# Patient Record
Sex: Female | Born: 1955
Health system: Southern US, Community
[De-identification: ages and names within clinical notes are randomized; demographics above are authoritative.]

## PROBLEM LIST (undated history)

## (undated) DIAGNOSIS — R112 Nausea with vomiting, unspecified: Secondary | ICD-10-CM

## (undated) DIAGNOSIS — Z9889 Other specified postprocedural states: Secondary | ICD-10-CM

## (undated) DIAGNOSIS — K219 Gastro-esophageal reflux disease without esophagitis: Secondary | ICD-10-CM

## (undated) DIAGNOSIS — K589 Irritable bowel syndrome without diarrhea: Secondary | ICD-10-CM

## (undated) DIAGNOSIS — I10 Essential (primary) hypertension: Secondary | ICD-10-CM

## (undated) DIAGNOSIS — I872 Venous insufficiency (chronic) (peripheral): Secondary | ICD-10-CM

## (undated) DIAGNOSIS — F419 Anxiety disorder, unspecified: Secondary | ICD-10-CM

## (undated) DIAGNOSIS — Z973 Presence of spectacles and contact lenses: Secondary | ICD-10-CM

## (undated) DIAGNOSIS — J45909 Unspecified asthma, uncomplicated: Secondary | ICD-10-CM

## (undated) DIAGNOSIS — F39 Unspecified mood [affective] disorder: Secondary | ICD-10-CM

## (undated) DIAGNOSIS — M79673 Pain in unspecified foot: Secondary | ICD-10-CM

## (undated) DIAGNOSIS — G4730 Sleep apnea, unspecified: Secondary | ICD-10-CM

## (undated) DIAGNOSIS — IMO0002 Reserved for concepts with insufficient information to code with codable children: Secondary | ICD-10-CM

## (undated) DIAGNOSIS — M797 Fibromyalgia: Secondary | ICD-10-CM

## (undated) HISTORY — DX: Unspecified mood (affective) disorder: F39

## (undated) HISTORY — PX: KNEE ARTHROPLASTY: SHX992

## (undated) HISTORY — DX: Reserved for concepts with insufficient information to code with codable children: IMO0002

## (undated) HISTORY — DX: Irritable bowel syndrome, unspecified: K58.9

## (undated) HISTORY — DX: Unspecified asthma, uncomplicated: J45.909

## (undated) HISTORY — DX: Venous insufficiency (chronic) (peripheral): I87.2

## (undated) HISTORY — DX: Pain in unspecified foot: M79.673

## (undated) HISTORY — DX: Fibromyalgia: M79.7

## (undated) HISTORY — DX: Anxiety disorder, unspecified: F41.9

---

## 2003-04-10 ENCOUNTER — Ambulatory Visit (HOSPITAL_COMMUNITY): Admission: RE | Admit: 2003-04-10 | Discharge: 2003-04-10 | Payer: Self-pay | Admitting: Internal Medicine

## 2003-04-10 HISTORY — PX: ESOPHAGOGASTRODUODENOSCOPY: SHX1529

## 2003-04-13 ENCOUNTER — Encounter: Payer: Self-pay | Admitting: Internal Medicine

## 2003-04-13 ENCOUNTER — Ambulatory Visit (HOSPITAL_COMMUNITY): Admission: RE | Admit: 2003-04-13 | Discharge: 2003-04-13 | Payer: Self-pay | Admitting: Internal Medicine

## 2005-04-19 ENCOUNTER — Ambulatory Visit: Payer: Self-pay | Admitting: Cardiology

## 2007-06-13 ENCOUNTER — Ambulatory Visit: Payer: Self-pay | Admitting: Gastroenterology

## 2009-08-25 DIAGNOSIS — Z8719 Personal history of other diseases of the digestive system: Secondary | ICD-10-CM | POA: Insufficient documentation

## 2009-08-25 DIAGNOSIS — F329 Major depressive disorder, single episode, unspecified: Secondary | ICD-10-CM | POA: Insufficient documentation

## 2009-08-25 DIAGNOSIS — J45909 Unspecified asthma, uncomplicated: Secondary | ICD-10-CM | POA: Insufficient documentation

## 2009-08-25 DIAGNOSIS — K219 Gastro-esophageal reflux disease without esophagitis: Secondary | ICD-10-CM | POA: Insufficient documentation

## 2009-08-25 DIAGNOSIS — A048 Other specified bacterial intestinal infections: Secondary | ICD-10-CM | POA: Insufficient documentation

## 2009-08-25 DIAGNOSIS — F3289 Other specified depressive episodes: Secondary | ICD-10-CM | POA: Insufficient documentation

## 2009-08-25 DIAGNOSIS — K8020 Calculus of gallbladder without cholecystitis without obstruction: Secondary | ICD-10-CM | POA: Insufficient documentation

## 2009-08-25 DIAGNOSIS — T7840XA Allergy, unspecified, initial encounter: Secondary | ICD-10-CM | POA: Insufficient documentation

## 2009-08-30 ENCOUNTER — Telehealth (INDEPENDENT_AMBULATORY_CARE_PROVIDER_SITE_OTHER): Payer: Self-pay | Admitting: *Deleted

## 2010-08-13 ENCOUNTER — Emergency Department (HOSPITAL_BASED_OUTPATIENT_CLINIC_OR_DEPARTMENT_OTHER): Admission: EM | Admit: 2010-08-13 | Discharge: 2010-08-13 | Payer: Self-pay | Admitting: Emergency Medicine

## 2010-09-11 HISTORY — PX: TIBIA FRACTURE SURGERY: SHX806

## 2011-01-24 NOTE — Assessment & Plan Note (Signed)
Marie Farmer, Marie Farmer                CHART#:  [id number]   DATE:  06/13/2007                       DOB:  [birth year]   CHIEF COMPLAINT:  Sour stomach, nausea, gas.   HISTORY OF PRESENT ILLNESS:  The patient is a 55 year old Caucasian  female who presents for further evaluation of the above-stated symptoms.  She is well known to our practice from previous evaluation of upper  abdominal pain, nausea, and gastroesophageal reflux disease.  She is  known to have cholelithiasis, though has not displayed typical  gallbladder symptoms in the past.  She presents with a 2-week history of  her stomach feeling sour.  She has only had pain on 1 occasion that  was associated with vomiting.  She has had intermittent nausea for 2  weeks.  She states her appetite is good, however.  She has had some  slight indigestion.  She is on Prilosec chronically.  She denies any  nocturnal symptoms.  Her bowel movements are regular.  She denies any  melena or rectal bleeding.  She has never had a colonoscopy.  She states  she has had abdominal gas and bloating for years.  She saw her  gynecologist in July of 2008 and states her exam was normal.  Since we  saw her in 2004, she has dropped 27 pounds. The patient tells me she got  down to 173, but has gained nearly 20 pounds back.  This was an  intentional weight loss.  She is on Prilosec chronically.   CURRENT MEDICATIONS:  1. Ambien CR at bedtime.  2. Prozac 80 mg 2 tablets daily.  3. Prilosec 20 mg daily.   ALLERGIES:  RELAFEN and CYMBALTA.   PAST MEDICAL HISTORY:  1. Chronic gastroesophageal reflux disease.  2. Depression.  3. Asthma.  4. Allergies.  5. She had an EGD in July of 2004 and had antral erosions.  H. pylori      serologies were positive.  She underwent triple drug therapy.  6. She has a history of cholelithiasis.   PAST SURGICAL HISTORY:  Left wrist surgery and tubal ligation.   FAMILY HISTORY:  Mother is 29 and has a history of diabetes,  heart  disease, and emphysema.  Father deceased at age 57 with lung carcinoma.  Maternal grandmother had cancer, but the patient is not aware of the  etiology.   SOCIAL HISTORY:  She is married and has 2 children.  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  She is a nonsmoker.  No  alcohol use.   REVIEW OF SYSTEMS:  See HPI for GI and constitutional.  Cardiopulmonary:  She denies any chest pain, shortness of breath, palpitations or cough.  She states that she had been on hydrochlorothiazide for lower extremity  edema discontinued when her edema issues resolved.  She has noted that  her blood pressure has been elevated on a couple of occasions since  being off the medication.  Genitourinary:  Complains of urinary  frequency at times.  She notes this particularly when she has a lot of  bloating and discomfort.  She denies any dysuria or hematuria.  She has  irregular menstrual cycles.   PHYSICAL EXAMINATION:  Weight 190, height 5 feet 4 inches, temp 98.1,  blood pressure 160/100, pulse 64.  GENERAL:  A pleasant, well-nourished, well-developed Caucasian female in  no acute distress.  SKIN:  Warm and dry.  No jaundice.  HEENT:  Sclerae nonicteric, oropharyngeal mucosa moist and pink, no  lesions, erythema or exudate.  No lymphadenopathy or thyromegaly.  CHEST:  Lungs clear to auscultation.  CARDIAC EXAM:  Regular rate and rhythm, normal S1, S2, no murmurs, rubs,  or gallops.  ABDOMEN:  Positive bowel sounds, abdomen soft, nondistended.  She has  minimal epigastric tenderness to deep palpation.  No rebound,  tenderness, or guarding, no hepatosplenomegaly or masses.  No abdominal  bruits or hernias.  EXTREMITIES:  No edema.   IMPRESSION:  Ranata is a 55 year old lady with a history of chronic  gastroesophageal reflux disease.  She now presents with vague nausea and  sour stomach.  Symptoms may be secondary to dyspepsia or refractory  gastroesophageal reflux  disease.  She is known to have cholelithiasis,  but really is not displaying any typical biliary symptoms.  She  complains of chronic excessive flatulence.  She has tried to make  dietary modifications.  In the past she did improve with probiotic  agents.   PLAN:  1. Increase Prilosec to 20 mg p.o. b.i.d.  2. Gas-X p.r.n. as directed.  3. Probiotic such as Flora-Q as directed.  4. Literature regarding gastroesophageal reflux disease and abdominal      gas and flatulence provided to the patient.  5. Antireflux measures.  6. She will follow up with Dr. Margo Common regarding elevated blood      pressure for recheck.  7. She will call us in 2-3 weeks with a progress report.  At that time      we will determine need for further workup.       Tana Coast, P.A.  Electronically Signed     Kassie Mends, M.D.  Electronically Signed    LL/MEDQ  D:  06/13/2007  T:  06/13/2007  Job:  [id number]   cc:   Wyvonnia Lora

## 2011-01-27 NOTE — Op Note (Signed)
NAME:  Marie Farmer, Marie Farmer                         ACCOUNT NO.:  [id number]   MEDICAL RECORD NO.:  [id number]                   PATIENT TYPE:  AMB   LOCATION:  DAY                                  FACILITY:  [facility]   PHYSICIAN:  R. Roetta Sessions, M.D.              DATE OF BIRTH:  08-Dec-1955   DATE OF PROCEDURE:  04/10/2003  DATE OF DISCHARGE:                                 OPERATIVE REPORT   PROCEDURE:  Diagnostic esophagogastroduodenoscopy.   INDICATIONS FOR PROCEDURE:  The patient is a 55 year old lady with  intermittent reflux symptoms and some upper abdominal pain.  She has lost a  considerable amount of weight in attempting to treat her gastroesophageal  reflux disease.  She has been taking Vioxx and Celebrex and is currently  taking Prilosec 20 mg orally daily and metoclopramide.  EGD is now being  done to further evaluate her symptoms.  This approach has been discussed  with the patient at length previously.  The potential risks, benefits, and  alternatives have been reviewed.  Please see my H&P from 04/01/2003 for more  information.   PROCEDURE:  O2 saturation, blood pressure, pulses, and respirations were  monitored throughout the entire procedure.  Conscious sedation was with  Versed 3 mg IV, Demerol 75 mg IV, Cetacaine spray for topical oropharyngeal  anesthesia.  The instrument used was the Olympus video chip adult  gastroscope.   FINDINGS:  Examination of the tubular esophagus revealed no mucosal  abnormalities.  The EG junction was easily traversed.   Stomach:  The gastric cavity was empty and insufflated well with air.  Thorough examination of the gastric mucosa including retroflex view in the  proximal stomach and esophagogastric junction demonstrated multiple antral  erosions.  Some of these had a minimal degree of hemorrhage associated with  them.  There was no infiltrating process, no frank peptic ulcer.  The  pylorus was patent and easily traversed.   Duodenum:   The bulb and second portion appeared normal.   THERAPEUTIC/DIAGNOSTIC MANEUVERS PERFORMED:  None.   The patient tolerated the procedure well and was reactive in endoscopy.   IMPRESSION:  1. Normal esophagus.  2. Antral erosions, as described above.  The remainder of the stomach     appeared normal.  Normal first and second parts of the duodenum.   RECOMMENDATIONS:  1. Will check H pylori serology.  2.     Continue Prilosec for the time being.  3. Proceed with gallbladder ultrasound to further evaluate this lady's     symptoms.  Further recommendations to follow.                                               Jonathon Bellows, M.D.    RMR/MEDQ  D:  04/10/2003  T:  04/10/2003  Job:  [id number]   cc:   Wyvonnia Lora  [address]  [place]  [place] [address]  Fax: [phone]

## 2011-01-27 NOTE — H&P (Signed)
NAMEAASHNA, MATSON                           ACCOUNT NO.:  [id number]   MEDICAL RECORD NO.:  [id number]                  PATIENT TYPE:   LOCATION:                                       FACILITY:   PHYSICIAN:  R. Roetta Sessions, M.D.              DATE OF BIRTH:  02/16/1948   DATE OF ADMISSION:  04/01/2003  DATE OF DISCHARGE:                                HISTORY & PHYSICAL   CHIEF COMPLAINT:  Nausea and sour stomach.   HISTORY OF PRESENT ILLNESS:  The patient is a pleasant, 55 year old,  Caucasian female seen by [place] years ago for gastrointestinal reflux disease.  She was doing fairly well on Prilosec 20 mg orally daily until recently when  she started having a sour stomach and a constant sensation of nausea.  She  rarely has any vomiting.  No odynophagia.  No dysphagia.  She does have  occasional typical reflux symptoms.  She has been on Vioxx and/or Celebrex  over the past few years.  She has not had any melena or rectal bleeding.  Back in February of 1998, I saw this nice lady at [facility] and  performed an EGD.  She was found to have hiatal hernia and reflux  esophagitis.  Her CLOtest was negative.  She has been successful, however,  in losing a good 35 pounds in an effort to combat her gastrointestinal  reflux symptoms.  I have seen her in just over three years.  She denies any  other intercurrent medical problems.  She continues to be followed primarily  by Dr. Margo Common.  There is no history of peptic ulcer disease.   PAST MEDICAL HISTORY:  Significant for:  1. Depression.  2. Obesity.  3. Asthma.  4. Gastrointestinal reflux disease.  5. Chronic back pain secondary to bulging disk.   CURRENT MEDICATIONS:  1. Lorazepam 0.5 mg b.i.d. p.r.n.  2. Prilosec 20 mg daily.  3. Triamterene/HCTZ daily.  4. Metoclopramide 5 mg q.i.d. p.r.n.  5. Valtrex 1 g daily p.r.n.  6. Neurontin 800 mg three times daily.  7. Hydrocodone p.r.n.  8. Skelaxin 400 mg two tablets p.r.n.  9. Lasix 40 mg daily.  10.      Celebrex 200 mg daily.  11.      Klor-Con 10 mEq daily.  12.      Wellbutrin 300 mg daily.  13.      Tylenol called.   ALLERGIES:  RELAFEN.   FAMILY HISTORY:  Negative for chronic GI or liver disease.   SOCIAL HISTORY:  The patient has been married for 22 years with two  children.  Employed with [employer]  No tobacco.  No alcohol.   REVIEW OF SYSTEMS:  Has had some recent chest pain (saw Dr. Myrtis Ser last week,  workup in progress, not felt to be coronary artery disease).  Weight loss as  described above, which has been secondary to conscious  effort.  No fever or  chills.   PHYSICAL EXAMINATION:  GENERAL APPEARANCE:  A 55 year old lady resting  comfortably.  WEIGHT:  207.5 pounds.  VITAL SIGNS:  BP 140/88, pulse 70.  SKIN:  Warm and dry.  No lesions.  HEENT:  No scleral icterus.  The conjunctivae are pink.  Oral cavity with no  lesions.  NECK:  JVD is not prominent.  CHEST:  Lungs are clear to auscultation.  CARDIAC:  Regular rate and rhythm without murmur, rub, or gallop.  BREASTS:  Exam is deferred.  ABDOMEN:  Obese.  Positive bowel sounds.  Soft and nontender.  No  appreciable mass or organomegaly.  EXTREMITIES:  No edema.   IMPRESSION:  The patient is a pleasant 55 year old lady with chronic nausea  and sour stomach sensation and ongoing intermittent reflux symptoms.  She  tells me that she does not feel that acid suppression therapy as working as  well as it did previously.  I do not really get a sense that she has  prominent reflux symptoms.  Now the symptoms are quite a bit different.  I  would be concerned about the possibility of occult peptic ulcer disease or  other mucosal pathology in her upper GI tract contributing to her symptoms.   RECOMMENDATIONS:  I have offered the patient an EGD to further evaluate her  symptoms.  The potential risks, benefits, and alternatives have been  reviewed and questions answered.  Depending on  findings of the EGD, may  consider gallbladder evaluation.  In light of her significant weight loss  recently, this would put her at some increased risk for cholelithiasis.  Further recommendations to follow.                                               Jonathon Bellows, M.D.    RMR/MEDQ  D:  04/01/2003  T:  04/01/2003  Job:  [id number]   cc:   Wyvonnia Lora  [address]  [place]  [place] [address]  Fax: [phone]

## 2011-05-10 DIAGNOSIS — S82143A Displaced bicondylar fracture of unspecified tibia, initial encounter for closed fracture: Secondary | ICD-10-CM | POA: Insufficient documentation

## 2011-05-12 ENCOUNTER — Encounter: Payer: Self-pay | Admitting: Emergency Medicine

## 2011-05-12 ENCOUNTER — Emergency Department (HOSPITAL_COMMUNITY)
Admission: EM | Admit: 2011-05-12 | Discharge: 2011-05-12 | Payer: Self-pay | Attending: Emergency Medicine | Admitting: Emergency Medicine

## 2011-05-12 ENCOUNTER — Emergency Department (HOSPITAL_COMMUNITY): Payer: Self-pay

## 2011-05-12 DIAGNOSIS — I10 Essential (primary) hypertension: Secondary | ICD-10-CM | POA: Insufficient documentation

## 2011-05-12 DIAGNOSIS — S139XXA Sprain of joints and ligaments of unspecified parts of neck, initial encounter: Secondary | ICD-10-CM | POA: Insufficient documentation

## 2011-05-12 DIAGNOSIS — Y92009 Unspecified place in unspecified non-institutional (private) residence as the place of occurrence of the external cause: Secondary | ICD-10-CM | POA: Insufficient documentation

## 2011-05-12 DIAGNOSIS — S161XXA Strain of muscle, fascia and tendon at neck level, initial encounter: Secondary | ICD-10-CM

## 2011-05-12 DIAGNOSIS — S8000XA Contusion of unspecified knee, initial encounter: Secondary | ICD-10-CM | POA: Insufficient documentation

## 2011-05-12 DIAGNOSIS — K219 Gastro-esophageal reflux disease without esophagitis: Secondary | ICD-10-CM | POA: Insufficient documentation

## 2011-05-12 HISTORY — DX: Essential (primary) hypertension: I10

## 2011-05-12 HISTORY — DX: Gastro-esophageal reflux disease without esophagitis: K21.9

## 2011-05-12 MED ORDER — HYDROCODONE-ACETAMINOPHEN 5-325 MG PO TABS
1.0000 | ORAL_TABLET | Freq: Once | ORAL | Status: AC
  Administered 2011-05-12: 1 via ORAL
  Filled 2011-05-12: qty 1
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
## 2011-12-20 HISTORY — PX: ESOPHAGOGASTRODUODENOSCOPY: SHX1529

## 2011-12-20 HISTORY — PX: COLONOSCOPY: SHX174

## 2012-02-26 ENCOUNTER — Encounter: Payer: Self-pay | Admitting: Internal Medicine

## 2012-02-29 ENCOUNTER — Ambulatory Visit (INDEPENDENT_AMBULATORY_CARE_PROVIDER_SITE_OTHER): Payer: BC Managed Care – PPO | Admitting: Urgent Care

## 2012-02-29 ENCOUNTER — Encounter: Payer: Self-pay | Admitting: Urgent Care

## 2012-02-29 VITALS — BP 114/71 | HR 55 | Temp 98.4°F | Ht 63.0 in | Wt 185.0 lb

## 2012-02-29 DIAGNOSIS — K589 Irritable bowel syndrome without diarrhea: Secondary | ICD-10-CM | POA: Insufficient documentation

## 2012-02-29 DIAGNOSIS — K219 Gastro-esophageal reflux disease without esophagitis: Secondary | ICD-10-CM

## 2012-02-29 NOTE — Assessment & Plan Note (Signed)
Discussed chronic nature & management of IBS. BeneFiber daily Align 1 capsule daily

## 2012-02-29 NOTE — Patient Instructions (Addendum)
Continued dexilant 60 mg daily Fiber daily Align 1 capsule daily Discussed been density testing with Dr. Margo Common.   He can also make suggestions regarding your calcium supplementation and/or bone building medications if needed. Office visit in 6 months or sooner if needed Diet for GERD or PUD Nutrition therapy can help ease the discomfort of gastroesophageal reflux disease (GERD) and peptic ulcer disease (PUD).  HOME CARE INSTRUCTIONS   Eat your meals slowly, in a relaxed setting.   Eat 5 to 6 small meals per day.   If a food causes distress, stop eating it for a period of time.  FOODS TO AVOID  Coffee, regular or decaffeinated.   Cola beverages, regular or low calorie.   Tea, regular or decaffeinated.   Pepper.   Cocoa.   High fat foods, including meats.   Butter, margarine, hydrogenated oil (trans fats).   Peppermint or spearmint (if you have GERD).   Fruits and vegetables if not tolerated.   Alcohol.   Nicotine (smoking or chewing). This is one of the most potent stimulants to acid production in the gastrointestinal tract.   Any food that seems to aggravate your condition.  If you have questions regarding your diet, ask your caregiver or a registered dietitian. TIPS  Lying flat may make symptoms worse. Keep the head of your bed raised 6 to 9 inches (15 to 23 cm) by using a foam wedge or blocks under the legs of the bed.   Do not lay down until 3 hours after eating a meal.   Daily physical activity may help reduce symptoms.  MAKE SURE YOU:   Understand these instructions.   Will watch your condition.   Will get help right away if you are not doing well or get worse.  Document Released: 08/28/2005 Document Revised: 08/17/2011 Document Reviewed: 07/14/2011 [facility] Patient Information 2012 [place], [place].

## 2012-02-29 NOTE — Assessment & Plan Note (Addendum)
Marie Farmer is a pleasant 56 y.o. female  Here to reestablish care after living in [place].  Hx GERD & IBS.  Recent colonoscopy/EGD/small bowel bx benign.  Dexilant controlling GERD symptoms.  Discussed risk of osteoporosis w/ PPis.  Questions answered.  Continue dexilant 60 mg daily Discussed bone density testing with Dr. Margo Common.   He can also make suggestions regarding your calcium supplementation and/or bone building medications if needed. GERD diet Office visit in 6 months or sooner if needed

## 2012-02-29 NOTE — Progress Notes (Signed)
Faxed to PCP

## 2012-02-29 NOTE — Progress Notes (Signed)
Primary Care Physician:  Louie Boston, MD Primary Gastroenterologist:  Dr. Jena Gauss  Chief Complaint  Patient presents with  . Gastrophageal Reflux    HPI:  Marie Farmer is a 56 y.o. female here as a new patient for evaluation of GERD & IBS.  She was previously pt of Dr Luvenia Starch.  Had moved to [place] after going through divorce.  Now moved back to [place], [place] since May 2013.  She was taking prilosec 20mg  daily & it stopped working.  She began to have heartburn & indigestion daily.  Now on dexilant 60mg  daily.  Works well for her heartburn.  She had a colonoscopy & EGD in [place], [place] while there (see below).  She is concerned about hip fractures & osteoporosis on PPIs.   She has not had a bone denity scan.  She has been having episodes of diarrhea 2 -3 per day w/ her IBS.  Denies rectal bleeding or melena.  Lost 40# reported in last yr due to stress w/ divorce.   Seeing a counselor names Erskine Squibb in [place], [place].  No NSAIDs.  Past Medical History  Diagnosis Date  . Hypertension   . GERD (gastroesophageal reflux disease)   . Mood disorder   . Anxiety   . Venous insufficiency   . Fibromyalgia   . DDD (degenerative disc disease)     chronic back pain  . Asthma   . IBS (irritable bowel syndrome)   . Foot pain     Past Surgical History  Procedure Date  . Knee arthroplasty   . Esophagogastroduodenoscopy 04/10/2003    Normal esophagus/Antral erosions, as described above.  The remainder of the stomach appeared normal.  Normal first and second parts of the duodenum.  . Esophagogastroduodenoscopy 12/20/11    [name]-hiatus hernia/chronic gastritis/normal duodenumNEGATIVE H pylori, negative SB biopsy  . Colonoscopy 12/20/11    [name]-single diminutive sessile polyp in the sigmoid colon/small interenal hemorrhoids  . Tibia fracture surgery     Current Outpatient Prescriptions  Medication Sig Dispense Refill  . ALPRAZolam (XANAX) 0.25 MG tablet Take 0.25 mg by mouth at bedtime as needed.        . benazepril-hydrochlorthiazide (LOTENSIN HCT) 20-25 MG per tablet Take 1 tablet by mouth every morning.        Marland Kitchen dexlansoprazole (DEXILANT) 60 MG capsule Take 60 mg by mouth daily.      Marland Kitchen FLUoxetine (PROZAC) 40 MG capsule Take 80 mg by mouth daily.       . QUEtiapine (SEROQUEL) 25 MG tablet Take 25 mg by mouth at bedtime.       Marland Kitchen zolpidem (AMBIEN CR) 12.5 MG CR tablet Take 12.5 mg by mouth at bedtime as needed.          Allergies as of 02/29/2012 - Review Complete 02/29/2012  Allergen Reaction Noted  . Adhesive (tape)  05/12/2011  . Duloxetine    . Latex  05/12/2011  . Nabumetone    . Azithromycin Rash 02/29/2012    Family History  Problem Relation Age of Onset  . Diabetes Mother   . Coronary artery disease Mother   . COPD Mother   . Lung cancer Father     History   Social History  . Marital Status: Married    Spouse Name: N/A    Number of Children: 2  . Years of Education: N/A   Occupational History  . unemployed    Social History Main Topics  . Smoking status: Never Smoker   . Smokeless tobacco:  Not on file  . Alcohol Use: Yes     socially, couple times per week  . Drug Use: No     remote maijuana  . Sexually Active: No   Other Topics Concern  . Not on file   Social History Narrative   Lives w/ sister & her husband & daughter  Review of Systems: Gen: see HPI CV:  Denies chest pain, angina, palpitations, syncope, orthopnea, PND, peripheral edema, and claudication. Resp: Denies dyspnea at rest, dyspnea with exercise, cough, sputum, wheezing, coughing up blood, and pleurisy. GI: Denies vomiting blood, jaundice, and fecal incontinence.    GU : Denies urinary burning, blood in urine, urinary frequency, urinary hesitancy, nocturnal urination, and urinary incontinence. MS: Denies joint pain, limitation of movement, and swelling, stiffness, low back pain, extremity pain. Denies muscle weakness, cramps, atrophy.  Derm: Denies rash, itching, dry skin, hives,  moles, warts, or unhealing ulcers.  Psych: Denies depression, anxiety, memory loss, suicidal ideation, hallucinations, paranoia, and confusion. Heme: Denies bruising, bleeding, and enlarged lymph nodes. Neuro:  Denies any headaches, dizziness, paresthesias. Endo:  Denies any problems with DM, thyroid, adrenal function.  Physical Exam: BP 114/71  Pulse 55  Temp 98.4 F (36.9 C) (Temporal)  Ht 5\' 3"  (1.6 m)  Wt 185 lb (83.915 kg)  BMI 32.77 kg/m2 General:   Alert,  Well-developed, well-nourished, pleasant and cooperative in NAD Head:  Normocephalic and atraumatic. Eyes:  Sclera clear, no icterus.   Conjunctiva pink. Ears:  Normal auditory acuity. Nose:  No deformity, discharge, or lesions. Mouth:  No deformity or lesions,oropharynx pink & moist. Neck:  Supple; no masses or thyromegaly. Lungs:  Clear throughout to auscultation.   No wheezes, crackles, or rhonchi. No acute distress. Heart:  Regular rate and rhythm; no murmurs, clicks, rubs,  or gallops. Abdomen:  Normal bowel sounds.  No bruits.  Soft, non-tender and non-distended without masses, hepatosplenomegaly or hernias noted.  No guarding or rebound tenderness.   Rectal:  Deferred.  Msk:  Symmetrical without gross deformities. Normal posture. Pulses:  Normal pulses noted. Extremities:  No clubbing or edema. Neurologic:  Alert and  oriented x4;  grossly normal neurologically. Skin:  Intact without significant lesions or rashes. Lymph Nodes:  No significant cervical adenopathy. Psych:  Alert and cooperative. Normal mood and affect.

## 2012-03-22 ENCOUNTER — Other Ambulatory Visit: Payer: Self-pay

## 2012-03-22 MED ORDER — DEXLANSOPRAZOLE 60 MG PO CPDR: 60.0000 mg | DELAYED_RELEASE_CAPSULE | Freq: Every day | ORAL | Status: DC

## 2012-06-18 ENCOUNTER — Telehealth: Payer: Self-pay | Admitting: Internal Medicine

## 2012-06-18 NOTE — Telephone Encounter (Signed)
Routed to refill box 

## 2012-06-18 NOTE — Telephone Encounter (Signed)
Pt is taking Dexilant. She said she started out taking it twice a day and then had it changed to once a day due to some other medications. She now is asking if we can change it back to twice a day. [facility] [place]. Please advise and call her back at [phone]

## 2012-06-21 NOTE — Telephone Encounter (Signed)
Darl Pikes please get pt appt. Thanks.

## 2012-06-21 NOTE — Telephone Encounter (Signed)
Dexilant 60mg  should be once daily. If she is having breakthrough, she should have OV to discuss. Thanks

## 2012-06-24 ENCOUNTER — Encounter: Payer: Self-pay | Admitting: Gastroenterology

## 2012-06-24 ENCOUNTER — Ambulatory Visit (INDEPENDENT_AMBULATORY_CARE_PROVIDER_SITE_OTHER): Payer: BC Managed Care – PPO | Admitting: Gastroenterology

## 2012-06-24 VITALS — BP 125/79 | HR 58 | Temp 97.5°F | Ht 63.0 in | Wt 190.2 lb

## 2012-06-24 DIAGNOSIS — K219 Gastro-esophageal reflux disease without esophagitis: Secondary | ICD-10-CM

## 2012-06-24 MED ORDER — DEXLANSOPRAZOLE 60 MG PO CPDR: 60.0000 mg | DELAYED_RELEASE_CAPSULE | Freq: Every day | ORAL | Status: DC

## 2012-06-24 NOTE — Progress Notes (Signed)
Faxed to PCP

## 2012-06-24 NOTE — Patient Instructions (Addendum)
You may increase Dexilant to twice a day short-term only. Then I would use twice a day only when needed. [facility] I would advise you discuss further with Dr. Margo Common. You may not be able to tolerate Fosamax long-term and may need to consider injectable for osteoporosis if he agrees.  Diet for Gastroesophageal Reflux Disease, Adult Reflux (acid reflux) is when acid from your stomach flows up into the esophagus. When acid comes in contact with the esophagus, the acid causes irritation and soreness (inflammation) in the esophagus. When reflux happens often or so severely that it causes damage to the esophagus, it is called gastroesophageal reflux disease (GERD). Nutrition therapy can help ease the discomfort of GERD. FOODS OR DRINKS TO AVOID OR LIMIT  Smoking or chewing tobacco. Nicotine is one of the most potent stimulants to acid production in the gastrointestinal tract.  Caffeinated and decaffeinated coffee and black tea.  Regular or low-calorie carbonated beverages or energy drinks (caffeine-free carbonated beverages are allowed).   Strong spices, such as black pepper, white pepper, red pepper, cayenne, curry powder, and chili powder.  Peppermint or spearmint.  Chocolate.  High-fat foods, including meats and fried foods. Extra added fats including oils, butter, salad dressings, and nuts. Limit these to less than 8 tsp per day.  Fruits and vegetables if they are not tolerated, such as citrus fruits or tomatoes.  Alcohol.  Any food that seems to aggravate your condition. If you have questions regarding your diet, call your caregiver or a registered dietitian. OTHER THINGS THAT MAY HELP GERD INCLUDE:   Eating your meals slowly, in a relaxed setting.  Eating 5 to 6 small meals per day instead of 3 large meals.  Eliminating food for a period of time if it causes distress.  Not lying down until 3 hours after eating a meal.  Keeping the head of your bed raised 6 to 9 inches (15  to 23 cm) by using a foam wedge or blocks under the legs of the bed. Lying flat may make symptoms worse.  Being physically active. Weight loss may be helpful in reducing reflux in overweight or obese adults.  Wear loose fitting clothing EXAMPLE MEAL PLAN This meal plan is approximately 2,000 calories based on https://www.bernard.org/ meal planning guidelines. Breakfast   cup cooked oatmeal.  1 cup strawberries.  1 cup low-fat milk.  1 oz almonds. Snack  1 cup cucumber slices.  6 oz yogurt (made from low-fat or fat-free milk). Lunch  2 slice whole-wheat bread.  2 oz sliced [place].  2 tsp mayonnaise.  1 cup blueberries.  1 cup snap peas. Snack  6 whole-wheat crackers.  1 oz string cheese. Dinner   cup brown rice.  1 cup mixed veggies.  1 tsp olive oil.  3 oz grilled fish. Document Released: 08/28/2005 Document Revised: 11/20/2011 Document Reviewed: 07/14/2011 [facility] Patient Information 2013 [place], [place].

## 2012-06-24 NOTE — Telephone Encounter (Signed)
Pt is upset and is aware of her OV to come in today and discuss her medications

## 2012-06-24 NOTE — Assessment & Plan Note (Signed)
Refractory GERD since on Fosamax three weeks ago. Patient requesting BID Dexilant. Short-term use of BID is okay but this is not a chronic solution. She may not tolerate Fosamax and should consider addressing different agent (?injectable) with Dr. Margo Common. For short-term, Dexilant BID. RX provided. Once symptoms better controlled, she should go back to once daily Dexilant. Discussed antireflux measures and hand out provided.

## 2012-06-24 NOTE — Progress Notes (Signed)
Primary Care Physician: Louie Boston, MD  Primary Gastroenterologist:  Roetta Sessions, MD   Chief Complaint  Patient presents with  . Advice Only    HPI: Marie Farmer is a 56 y.o. female here for refractory GERD. Last seen in office in 02/2012 and was doing well on Dexilant once daily. She has previously tried and failed Prilosec BID, Prevacid, Nexium, pantoprazole, Aciphex. Some of the PPIs she took twice a day. She was doing well until started on Fosamax three weeks ago. Wakes up with heartburn. Nocturnal heartburn. Sleeping in the recliner. Doesn't seem to matter what she eats. Consistently having breakthrough symptoms. Stopped soft drinks two weeks ago. No odynophagia, dysphagia. Denies abdominal pain, melena, brbpr.   Current Outpatient Prescriptions  Medication Sig Dispense Refill  . alendronate (FOSAMAX) 70 MG tablet Take 70 mg by mouth every 7 (seven) days. Take with a full glass of water on an empty stomach.      . ALPRAZolam (XANAX) 0.25 MG tablet Take 0.25 mg by mouth at bedtime as needed.       . benazepril-hydrochlorthiazide (LOTENSIN HCT) 20-25 MG per tablet Take 1 tablet by mouth every morning.        . calcium-vitamin D (OSCAL WITH D) 500-200 MG-UNIT per tablet Take 1 tablet by mouth daily.      Marland Kitchen dexlansoprazole (DEXILANT) 60 MG capsule Take 1 capsule (60 mg total) by mouth daily.  90 capsule  3  . FLUoxetine (PROZAC) 40 MG capsule Take 80 mg by mouth daily.       . Glucosamine 500 MG TABS Take 500 mg by mouth daily.      Marland Kitchen ibuprofen (ADVIL,MOTRIN) 200 MG tablet Take 200 mg by mouth every 6 (six) hours as needed.      Marland Kitchen QUEtiapine (SEROQUEL) 25 MG tablet Take 25 mg by mouth at bedtime.       . valACYclovir (VALTREX) 1000 MG tablet Take 1,000 mg by mouth daily.      . vitamin B-12 (CYANOCOBALAMIN) 1000 MCG tablet Take 1,000 mcg by mouth daily.      Marland Kitchen zolpidem (AMBIEN CR) 12.5 MG CR tablet Take 12.5 mg by mouth at bedtime as needed.          Allergies as of 06/24/2012 -  Review Complete 06/24/2012  Allergen Reaction Noted  . Adhesive (tape)  05/12/2011  . Duloxetine    . Latex  05/12/2011  . Nabumetone    . Azithromycin Rash 02/29/2012    ROS:  General: Negative for anorexia, weight loss, fever, chills, fatigue, weakness. ENT: Negative for hoarseness, difficulty swallowing , nasal congestion. CV: Negative for chest pain, angina, palpitations, dyspnea on exertion, peripheral edema.  Respiratory: Negative for dyspnea at rest, dyspnea on exertion, cough, sputum, wheezing.  GI: See history of present illness. GU:  Negative for dysuria, hematuria, urinary incontinence, urinary frequency, nocturnal urination.  Endo: Negative for unusual weight change.    Physical Examination:   BP 125/79  Pulse 58  Temp 97.5 F (36.4 C)  Ht 5\' 3"  (1.6 m)  Wt 190 lb 3.2 oz (86.274 kg)  BMI 33.69 kg/m2  General: Well-nourished, well-developed in no acute distress.  Eyes: No icterus. Mouth: Oropharyngeal mucosa moist and pink , no lesions erythema or exudate. Lungs: Clear to auscultation bilaterally.  Heart: Regular rate and rhythm, no murmurs rubs or gallops.  Abdomen: Bowel sounds are normal, nontender, nondistended, no hepatosplenomegaly or masses, no abdominal bruits or hernia , no rebound or guarding.   Extremities:  No lower extremity edema. No clubbing or deformities. Neuro: Alert and oriented x 4   Skin: Warm and dry, no jaundice.   Psych: Alert and cooperative, normal mood and affect.

## 2012-07-03 ENCOUNTER — Telehealth: Payer: Self-pay

## 2012-07-03 NOTE — Telephone Encounter (Signed)
Pt called- she is still unable to get dexilant bid. [facility] [place]- they claim that they did not get the rx for #180 from [facility] on 06/24/12. He stated pt is paying 87.00 for 3 month supply and #180 may need a PA.  How long did you want pt to take dexilant bid? OV note only says short term. We can call it in but need specific directions or do you want to give pt samples for short term use? Please advise.

## 2012-07-04 MED ORDER — DEXLANSOPRAZOLE 60 MG PO CPDR: DELAYED_RELEASE_CAPSULE | ORAL | Status: DC

## 2012-07-04 NOTE — Telephone Encounter (Signed)
Since she was requesting 90 day supply, I wrote for BID for 90 days only with no refills.   We could do Once per day, #90, 3 refills and give her Dexilant #30 samples to take BID for one month only. That would probably be the easiest. Please let pharmacy know.

## 2012-07-04 NOTE — Telephone Encounter (Signed)
As discussed with her in office. I don't think long-term Dexilant BID is good option as it is already a dual release drug. I agreed to her trying BID until she feels better and then she needs to try to go back to once daily.   I recommend BID for one month. Then go to once daily and see how she does.   OV with RMR only in 6 weeks.

## 2012-07-04 NOTE — Telephone Encounter (Signed)
Spoke with pt- she stated she needed to take the med bid d/t her taking her bone medication- fosamax. When I asked about her changing the fosamax with her pcp, she stated she asked about it and was told it was too expensive and her insurance wont pay for it. Pt stated she will have to take fosamax long term and will need to take dexilant bid for long term as well. Advised pt that I have put #30 samples at the front desk and that I would talk with [facility] about it. Do you want pt to take dexilant bid for the long term?

## 2012-07-05 NOTE — Telephone Encounter (Signed)
Pt is aware.  Susan, please schedule ov with RMR 

## 2012-07-10 NOTE — Telephone Encounter (Signed)
Reminder in epic to follow up with RMR in 6 weeks

## 2012-08-12 ENCOUNTER — Encounter: Payer: Self-pay | Admitting: Internal Medicine

## 2012-10-04 ENCOUNTER — Ambulatory Visit (INDEPENDENT_AMBULATORY_CARE_PROVIDER_SITE_OTHER): Payer: BC Managed Care – PPO | Admitting: Internal Medicine

## 2012-10-04 ENCOUNTER — Encounter: Payer: Self-pay | Admitting: Internal Medicine

## 2012-10-04 VITALS — BP 130/80 | HR 61 | Temp 97.5°F | Ht 64.0 in | Wt 194.0 lb

## 2012-10-04 DIAGNOSIS — K3189 Other diseases of stomach and duodenum: Secondary | ICD-10-CM

## 2012-10-04 DIAGNOSIS — K219 Gastro-esophageal reflux disease without esophagitis: Secondary | ICD-10-CM

## 2012-10-04 DIAGNOSIS — R1013 Epigastric pain: Secondary | ICD-10-CM

## 2012-10-04 NOTE — Patient Instructions (Signed)
Continue Dexilant 60 mg daily  Avoid Fosamax in the future  Office visit in 1 year  Screening colonoscopy in 4 years

## 2012-10-04 NOTE — Progress Notes (Signed)
Primary Care Physician:  Louie Boston, MD Primary Gastroenterologist:  Dr. Jena Gauss  Pre-Procedure History & Physical: HPI:  Marie Farmer is a 57 y.o. female here for followup of flare of GERD/dyspepsia. Patient has been on Fosamax for about one month and her reflux/ dyspepsia symptoms have settled back down to baseline. Her reflux symptoms are well-controlled. She takes Dexilant nail 60 mg orally once daily. No dysphagia. Currently not on any bisphosphonate therapy. Reviewed endoscopy reports from last year-[place]. Patient did not have any adenomas found/biopsy. Positive family history of colonic adenomas in her mother, however, which would Ward 5 year interval screening examinations.  Past Medical History  Diagnosis Date  . Hypertension   . GERD (gastroesophageal reflux disease)   . Mood disorder   . Anxiety   . Venous insufficiency   . Fibromyalgia   . DDD (degenerative disc disease)     chronic back pain  . Asthma   . IBS (irritable bowel syndrome)   . Foot pain     Past Surgical History  Procedure Date  . Knee arthroplasty   . Esophagogastroduodenoscopy 04/10/2003    Normal esophagus/Antral erosions, as described above.  The remainder of the stomach appeared normal.  Normal first and second parts of the duodenum.  . Esophagogastroduodenoscopy 12/20/11    [name]-hiatus hernia/chronic gastritis/normal duodenumNEGATIVE H pylori, negative SB biopsy  . Colonoscopy 12/20/11    [name]-single diminutive sessile polyp in the sigmoid colon/small interenal hemorrhoids  . Tibia fracture surgery     Prior to Admission medications   Medication Sig Start Date End Date Taking? Authorizing Provider  ALPRAZolam (XANAX) 0.25 MG tablet Take 0.25 mg by mouth at bedtime as needed.    Yes Historical Provider, MD  benazepril-hydrochlorthiazide (LOTENSIN HCT) 20-25 MG per tablet Take 1 tablet by mouth every morning.     Yes Historical Provider, MD  calcium-vitamin D (OSCAL WITH D) 500-200  MG-UNIT per tablet Take 1 tablet by mouth daily.   Yes Historical Provider, MD  dexlansoprazole (DEXILANT) 60 MG capsule Take twice a day for one month, then daily. 07/04/12  Yes Tiffany Kocher, PA  FLUoxetine (PROZAC) 40 MG capsule Take 80 mg by mouth daily.    Yes Historical Provider, MD  Glucosamine 500 MG TABS Take 500 mg by mouth daily.   Yes Historical Provider, MD  ibuprofen (ADVIL,MOTRIN) 200 MG tablet Take 200 mg by mouth every 6 (six) hours as needed.   Yes Historical Provider, MD  QUEtiapine (SEROQUEL) 25 MG tablet Take 25 mg by mouth at bedtime.    Yes Historical Provider, MD  valACYclovir (VALTREX) 1000 MG tablet Take 1,000 mg by mouth daily.   Yes Historical Provider, MD  vitamin B-12 (CYANOCOBALAMIN) 1000 MCG tablet Take 1,000 mcg by mouth daily.   Yes Historical Provider, MD  zolpidem (AMBIEN CR) 12.5 MG CR tablet Take 12.5 mg by mouth at bedtime as needed.     Yes Historical Provider, MD  alendronate (FOSAMAX) 70 MG tablet Take 70 mg by mouth every 7 (seven) days. Take with a full glass of water on an empty stomach.    Historical Provider, MD    Allergies as of 10/04/2012 - Review Complete 10/04/2012  Allergen Reaction Noted  . Adhesive (tape)  05/12/2011  . Duloxetine    . Latex  05/12/2011  . Nabumetone    . Azithromycin Rash 02/29/2012    Family History  Problem Relation Age of Onset  . Diabetes Mother   . Coronary artery disease Mother   .  COPD Mother   . Lung cancer Father     History   Social History  . Marital Status: Married    Spouse Name: N/A    Number of Children: 2  . Years of Education: N/A   Occupational History  . unemployed    Social History Main Topics  . Smoking status: Never Smoker   . Smokeless tobacco: Not on file  . Alcohol Use: Yes     Comment: socially, couple times per week  . Drug Use: No     Comment: remote maijuana  . Sexually Active: No   Other Topics Concern  . Not on file   Social History Narrative   Lives w/ sister  & her husband & daughter    Review of Systems: See HPI, otherwise negative ROS  Physical Exam: BP 130/80  Pulse 61  Temp 97.5 F (36.4 C) (Oral)  Ht 5\' 4"  (1.626 m)  Wt 194 lb (87.998 kg)  BMI 33.30 kg/m2 General:   Alert,  Well-developed, well-nourished, pleasant and cooperative in NAD Skin:  Intact without significant lesions or rashes. Eyes:  Sclera clear, no icterus.   Conjunctiva pink. Ears:  Normal auditory acuity. Nose:  No deformity, discharge,  or lesions. Mouth:  No deformity or lesions. Neck:  Supple; no masses or thyromegaly. No significant cervical adenopathy. Lungs:  Clear throughout to auscultation.   No wheezes, crackles, or rhonchi. No acute distress. Heart:  Regular rate and rhythm; no murmurs, clicks, rubs,  or gallops. Abdomen: Non-distended, normal bowel sounds.  Soft and nontender without appreciable mass or hepatosplenomegaly.  Pulses:  Normal pulses noted. Extremities:  Without clubbing or edema.  Impression/Plan:  57 year old lady with long-standing GERD symptoms now back to baseline since stopping the Fosamax. I feel she is intolerant to this class of medications given via  the oral route.   Recommendations:  Continue Dexilant 60 mg once daily. The benefits outweigh the risks. Discussed the multipronged approach to reflux with the patient. Recommended diet and weight loss. Screening colonoscopy slated for 2018. Office visit with [place] in one year. Should she need bisphosphonate therapy in the future, she should go with one of the parenteral agents.

## 2012-12-30 ENCOUNTER — Other Ambulatory Visit: Payer: Self-pay | Admitting: Gastroenterology

## 2013-05-09 ENCOUNTER — Other Ambulatory Visit: Payer: Self-pay | Admitting: Orthopedic Surgery

## 2013-06-04 ENCOUNTER — Encounter (HOSPITAL_BASED_OUTPATIENT_CLINIC_OR_DEPARTMENT_OTHER): Payer: Self-pay | Admitting: *Deleted

## 2013-06-04 NOTE — Progress Notes (Signed)
Pt has ibs and gerd-she has htn-saw dr Myrtis Ser 24yr ago-for htn Not since-called for notes dr tapper-to go to [facility] for bmet and ekg

## 2013-06-05 ENCOUNTER — Other Ambulatory Visit: Payer: Self-pay

## 2013-06-05 ENCOUNTER — Encounter (HOSPITAL_COMMUNITY)
Admission: RE | Admit: 2013-06-05 | Discharge: 2013-06-05 | Disposition: A | Discharge status: Home / Self Care | Payer: BC Managed Care – PPO | Source: Ambulatory Visit | Attending: Orthopedic Surgery | Admitting: Orthopedic Surgery

## 2013-06-05 DIAGNOSIS — Z01818 Encounter for other preprocedural examination: Secondary | ICD-10-CM | POA: Insufficient documentation

## 2013-06-05 DIAGNOSIS — Z01810 Encounter for preprocedural cardiovascular examination: Secondary | ICD-10-CM | POA: Insufficient documentation

## 2013-06-05 DIAGNOSIS — Z01812 Encounter for preprocedural laboratory examination: Secondary | ICD-10-CM | POA: Insufficient documentation

## 2013-06-05 LAB — BASIC METABOLIC PANEL
BUN: 18 mg/dL (ref 6–23)
Calcium: 10 mg/dL (ref 8.4–10.5)
Chloride: 103 mEq/L (ref 96–112)
Creatinine, Ser: 1.15 mg/dL — ABNORMAL HIGH (ref 0.50–1.10)
GFR calc Af Amer: 61 mL/min — ABNORMAL LOW (ref >90)
GFR calc non Af Amer: 53 mL/min — ABNORMAL LOW (ref >90)

## 2013-06-10 ENCOUNTER — Encounter (HOSPITAL_BASED_OUTPATIENT_CLINIC_OR_DEPARTMENT_OTHER)
Admission: RE | Disposition: A | Discharge status: Home / Self Care | Payer: Self-pay | Source: Ambulatory Visit | Attending: Orthopedic Surgery

## 2013-06-10 ENCOUNTER — Encounter (HOSPITAL_BASED_OUTPATIENT_CLINIC_OR_DEPARTMENT_OTHER): Payer: Self-pay | Admitting: Anesthesiology

## 2013-06-10 ENCOUNTER — Ambulatory Visit (HOSPITAL_BASED_OUTPATIENT_CLINIC_OR_DEPARTMENT_OTHER): Payer: BC Managed Care – PPO | Admitting: Anesthesiology

## 2013-06-10 ENCOUNTER — Encounter (HOSPITAL_BASED_OUTPATIENT_CLINIC_OR_DEPARTMENT_OTHER): Payer: Self-pay | Admitting: Orthopedic Surgery

## 2013-06-10 ENCOUNTER — Ambulatory Visit (HOSPITAL_BASED_OUTPATIENT_CLINIC_OR_DEPARTMENT_OTHER)
Admission: RE | Admit: 2013-06-10 | Discharge: 2013-06-10 | Disposition: A | Discharge status: Home / Self Care | Payer: BC Managed Care – PPO | Source: Ambulatory Visit | Attending: Orthopedic Surgery | Admitting: Orthopedic Surgery

## 2013-06-10 DIAGNOSIS — G5600 Carpal tunnel syndrome, unspecified upper limb: Secondary | ICD-10-CM | POA: Insufficient documentation

## 2013-06-10 DIAGNOSIS — F411 Generalized anxiety disorder: Secondary | ICD-10-CM | POA: Insufficient documentation

## 2013-06-10 DIAGNOSIS — I10 Essential (primary) hypertension: Secondary | ICD-10-CM | POA: Insufficient documentation

## 2013-06-10 DIAGNOSIS — K219 Gastro-esophageal reflux disease without esophagitis: Secondary | ICD-10-CM | POA: Insufficient documentation

## 2013-06-10 HISTORY — DX: Presence of spectacles and contact lenses: Z97.3

## 2013-06-10 HISTORY — PX: CARPAL TUNNEL RELEASE: SHX101

## 2013-06-10 LAB — POCT HEMOGLOBIN-HEMACUE: Hemoglobin: 12.5 g/dL (ref 12.0–15.0)

## 2013-06-10 SURGERY — CARPAL TUNNEL RELEASE
Anesthesia: Monitor Anesthesia Care | Site: Wrist | Laterality: Right | Wound class: Clean

## 2013-06-10 MED ORDER — CEFAZOLIN SODIUM-DEXTROSE 2-3 GM-% IV SOLR
2.0000 g | INTRAVENOUS | Status: AC
  Administered 2013-06-10: 2 g via INTRAVENOUS

## 2013-06-10 MED ORDER — ONDANSETRON HCL 4 MG/2ML IJ SOLN: 4.0000 mg | Freq: Once | INTRAMUSCULAR | Status: DC | PRN

## 2013-06-10 MED ORDER — PROPOFOL INFUSION 10 MG/ML OPTIME
INTRAVENOUS | Status: DC | PRN
  Administered 2013-06-10: 50 ug/kg/min via INTRAVENOUS

## 2013-06-10 MED ORDER — LACTATED RINGERS IV SOLN
INTRAVENOUS | Status: DC
  Administered 2013-06-10: 08:00:00 via INTRAVENOUS

## 2013-06-10 MED ORDER — 0.9 % SODIUM CHLORIDE (POUR BTL) OPTIME
TOPICAL | Status: DC | PRN
  Administered 2013-06-10: 200 mL

## 2013-06-10 MED ORDER — OXYCODONE HCL 5 MG PO TABS: 5.0000 mg | ORAL_TABLET | Freq: Once | ORAL | Status: DC | PRN

## 2013-06-10 MED ORDER — HYDROMORPHONE HCL PF 1 MG/ML IJ SOLN: 0.2500 mg | INTRAMUSCULAR | Status: DC | PRN

## 2013-06-10 MED ORDER — OXYCODONE HCL 5 MG/5ML PO SOLN: 5.0000 mg | Freq: Once | ORAL | Status: DC | PRN

## 2013-06-10 MED ORDER — ONDANSETRON HCL 4 MG/2ML IJ SOLN
INTRAMUSCULAR | Status: DC | PRN
  Administered 2013-06-10: 4 mg via INTRAVENOUS

## 2013-06-10 MED ORDER — LIDOCAINE HCL (PF) 0.5 % IJ SOLN
INTRAMUSCULAR | Status: DC | PRN
  Administered 2013-06-10: 30 mL via INTRAVENOUS

## 2013-06-10 MED ORDER — CHLORHEXIDINE GLUCONATE 4 % EX LIQD: 60.0000 mL | Freq: Once | CUTANEOUS | Status: DC

## 2013-06-10 MED ORDER — MIDAZOLAM HCL 5 MG/5ML IJ SOLN
INTRAMUSCULAR | Status: DC | PRN
  Administered 2013-06-10: 2 mg via INTRAVENOUS

## 2013-06-10 MED ORDER — BUPIVACAINE HCL (PF) 0.25 % IJ SOLN
INTRAMUSCULAR | Status: DC | PRN
  Administered 2013-06-10: 7 mL

## 2013-06-10 MED ORDER — FENTANYL CITRATE 0.05 MG/ML IJ SOLN: 50.0000 ug | INTRAMUSCULAR | Status: DC | PRN

## 2013-06-10 MED ORDER — HYDROCODONE-ACETAMINOPHEN 5-325 MG PO TABS: 1.0000 | ORAL_TABLET | Freq: Four times a day (QID) | ORAL | Status: DC | PRN

## 2013-06-10 MED ORDER — FENTANYL CITRATE 0.05 MG/ML IJ SOLN
INTRAMUSCULAR | Status: DC | PRN
  Administered 2013-06-10: 100 ug via INTRAVENOUS

## 2013-06-10 MED ORDER — MIDAZOLAM HCL 2 MG/2ML IJ SOLN: 1.0000 mg | INTRAMUSCULAR | Status: DC | PRN

## 2013-06-10 SURGICAL SUPPLY — 37 items
BANDAGE GAUZE ELAST BULKY 4 IN (GAUZE/BANDAGES/DRESSINGS) ×2 IMPLANT
BLADE SURG 15 STRL LF DISP TIS (BLADE) ×1 IMPLANT
BLADE SURG 15 STRL SS (BLADE) ×1
BNDG COHESIVE 3X5 TAN STRL LF (GAUZE/BANDAGES/DRESSINGS) ×2 IMPLANT
BNDG ESMARK 4X9 LF (GAUZE/BANDAGES/DRESSINGS) IMPLANT
CHLORAPREP W/TINT 26ML (MISCELLANEOUS) ×2 IMPLANT
CLOTH BEACON ORANGE TIMEOUT ST (SAFETY) IMPLANT
CORDS BIPOLAR (ELECTRODE) ×2 IMPLANT
COVER MAYO STAND STRL (DRAPES) ×2 IMPLANT
COVER TABLE BACK 60X90 (DRAPES) ×2 IMPLANT
CUFF TOURNIQUET SINGLE 18IN (TOURNIQUET CUFF) ×2 IMPLANT
DRAPE EXTREMITY T 121X128X90 (DRAPE) ×2 IMPLANT
DRAPE SURG 17X23 STRL (DRAPES) ×2 IMPLANT
DRSG KUZMA FLUFF (GAUZE/BANDAGES/DRESSINGS) ×2 IMPLANT
GAUZE XEROFORM 1X8 LF (GAUZE/BANDAGES/DRESSINGS) ×2 IMPLANT
GLOVE BIO SURGEON STRL SZ 6.5 (GLOVE) IMPLANT
GLOVE BIOGEL PI IND STRL 8.5 (GLOVE) ×1 IMPLANT
GLOVE BIOGEL PI INDICATOR 8.5 (GLOVE) ×1
GLOVE SURG ORTHO 8.0 STRL STRW (GLOVE) IMPLANT
GLOVE SURG SS PI 6.5 STRL IVOR (GLOVE) ×2 IMPLANT
GOWN BRE IMP PREV XXLGXLNG (GOWN DISPOSABLE) ×2 IMPLANT
GOWN PREVENTION PLUS XLARGE (GOWN DISPOSABLE) ×2 IMPLANT
NEEDLE 27GAX1X1/2 (NEEDLE) ×2 IMPLANT
NS IRRIG 1000ML POUR BTL (IV SOLUTION) ×2 IMPLANT
PACK BASIN DAY SURGERY FS (CUSTOM PROCEDURE TRAY) ×2 IMPLANT
PAD CAST 3X4 CTTN HI CHSV (CAST SUPPLIES) ×1 IMPLANT
PADDING CAST ABS 4INX4YD NS (CAST SUPPLIES) ×1
PADDING CAST ABS COTTON 4X4 ST (CAST SUPPLIES) ×1 IMPLANT
PADDING CAST COTTON 3X4 STRL (CAST SUPPLIES) ×1
SPONGE GAUZE 4X4 12PLY (GAUZE/BANDAGES/DRESSINGS) ×2 IMPLANT
STOCKINETTE 4X48 STRL (DRAPES) ×2 IMPLANT
SUT VICRYL 4-0 PS2 18IN ABS (SUTURE) IMPLANT
SUT VICRYL RAPIDE 4/0 PS 2 (SUTURE) ×2 IMPLANT
SYR BULB 3OZ (MISCELLANEOUS) ×2 IMPLANT
SYR CONTROL 10ML LL (SYRINGE) ×2 IMPLANT
TOWEL OR 17X24 6PK STRL BLUE (TOWEL DISPOSABLE) ×2 IMPLANT
UNDERPAD 30X30 INCONTINENT (UNDERPADS AND DIAPERS) ×2 IMPLANT

## 2013-06-10 NOTE — Addendum Note (Signed)
Addendum created 06/10/13 0956 by Lance Coon, CRNA   Modules edited: Anesthesia Medication Administration

## 2013-06-10 NOTE — Op Note (Signed)
Dictation Number [id number]

## 2013-06-10 NOTE — Anesthesia Preprocedure Evaluation (Signed)
Anesthesia Evaluation  Patient identified by MRN, date of birth, ID band Patient awake    Reviewed: Allergy & Precautions, H&P , NPO status , Patient's Chart, lab work & pertinent test results  Airway Mallampati: I TM Distance: >3 FB Neck ROM: Full    Dental  (+) Teeth Intact and Dental Advisory Given   Pulmonary  breath sounds clear to auscultation        Cardiovascular hypertension, Pt. on medications Rhythm:Regular Rate:Normal     Neuro/Psych    GI/Hepatic GERD-  Controlled and Medicated,  Endo/Other    Renal/GU      Musculoskeletal   Abdominal   Peds  Hematology   Anesthesia Other Findings   Reproductive/Obstetrics                           Anesthesia Physical Anesthesia Plan  ASA: II  Anesthesia Plan: MAC and Bier Block   Post-op Pain Management:    Induction: Intravenous  Airway Management Planned: Simple Face Mask  Additional Equipment:   Intra-op Plan:   Post-operative Plan: Extubation in OR  Informed Consent: I have reviewed the patients History and Physical, chart, labs and discussed the procedure including the risks, benefits and alternatives for the proposed anesthesia with the patient or authorized representative who has indicated his/her understanding and acceptance.   Dental advisory given  Plan Discussed with: CRNA and Surgeon  Anesthesia Plan Comments:         Anesthesia Quick Evaluation

## 2013-06-10 NOTE — Anesthesia Postprocedure Evaluation (Signed)
  Anesthesia Post-op Note  Patient: Marie Farmer  Procedure(s) Performed: Procedure(s): CARPAL TUNNEL RELEASE (Right)  Patient Location: PACU  Anesthesia Type:MAC and Bier block  Level of Consciousness: awake, alert  and oriented  Airway and Oxygen Therapy: Patient Spontanous Breathing  Post-op Pain: mild  Post-op Assessment: Post-op Vital signs reviewed  Post-op Vital Signs: Reviewed  Complications: No apparent anesthesia complications

## 2013-06-10 NOTE — Transfer of Care (Signed)
Immediate Anesthesia Transfer of Care Note  Patient: Marie Farmer  Procedure(s) Performed: Procedure(s): CARPAL TUNNEL RELEASE (Right)  Patient Location: PACU  Anesthesia Type:Bier block  Level of Consciousness: awake and alert   Airway & Oxygen Therapy: Patient Spontanous Breathing and Patient connected to face mask oxygen  Post-op Assessment: Report given to PACU RN and Post -op Vital signs reviewed and stable  Post vital signs: Reviewed and stable  Complications: No apparent anesthesia complications

## 2013-06-10 NOTE — H&P (Signed)
Marie Farmer is a 57 year old right hand dominant female who comes in complaining of right hand numbness and tingling. She complains of both sides but the right is much greater than the left. this has been going on for 2 years. She has no history of injury. She states all of her fingers are numb. She is awakened 3-4 out of 7 nights. She has no history of injury to her hand or neck. She was given Lyrica for her foot and had some left over and has been taking it without significant change in her symptoms. She has a history of arthritis. No history of diabetes, thyroid problems, or gout. She complains of a sharp dull, stabbing, throbbing and aching bad pain. She states it is getting worse. Activity, exercise and work makes this worse, ice has helped. Nefve conductions are positive for carpal tunnel syndromel.  PAST MEDICAL HISTORY: She is allergic to Relafen, Cymbalta, and Mobic. She is on Dexilant and Benazepril.  Past surgery includes a fracture right wrist, broken right leg, foot surgery.  FAMILY H ISTORY: Positive for diabetes, heart disease, high BP and arthritis.  SOCIAL HISTORY: She does not smoke or drink. She is married.  REVIEW OF SYSTEMS: Positive for glasses, cataracts, high BP, asthma, headaches, easy bleeding but otherwise negative Marie Farmer is an 57 y.o. female.   Chief Complaint: CTS RT  HPI: see above  Past Medical History  Diagnosis Date  . Hypertension   . GERD (gastroesophageal reflux disease)   . Mood disorder   . Anxiety   . Venous insufficiency   . Fibromyalgia   . DDD (degenerative disc disease)     chronic back pain  . IBS (irritable bowel syndrome)   . Foot pain   . Asthma   . Wears glasses     Past Surgical History  Procedure Laterality Date  . Knee arthroplasty    . Esophagogastroduodenoscopy  04/10/2003    Normal esophagus/Antral erosions, as described above.  The remainder of the stomach appeared normal.  Normal first and second parts of the  duodenum.  . Esophagogastroduodenoscopy  12/20/11    [name]-hiatus hernia/chronic gastritis/normal duodenumNEGATIVE H pylori, negative SB biopsy  . Colonoscopy  12/20/11    [name]-single diminutive sessile polyp in the sigmoid colon/small interenal hemorrhoids  . Tibia fracture surgery  2012    right    Family History  Problem Relation Age of Onset  . Diabetes Mother   . Coronary artery disease Mother   . COPD Mother   . Lung cancer Father    Social History:  reports that she has never smoked. She does not have any smokeless tobacco history on file. She reports that  drinks alcohol. She reports that she does not use illicit drugs.  Allergies:  Allergies  Allergen Reactions  . Adhesive [Tape]     Peels skin  . Cymbalta [Duloxetine Hcl]     Hot mouth  . Duloxetine     REACTION: UNKNOWN REACTION  . Latex     Sensitive skin  . Nabumetone     REACTION: UNKNOWN REACTION  . Azithromycin Rash    No prescriptions prior to admission    No results found for this or any previous visit (from the past 48 hour(s)).  No results found.   Pertinent items are noted in HPI.  Height 5\' 4"  (1.626 m), weight 195 lb (88.451 kg).  General appearance: alert, cooperative and appears stated age Head: Normocephalic, without obvious abnormality Neck: no JVD Resp:  clear to auscultation bilaterally Cardio: regular rate and rhythm, S1, S2 normal, no murmur, click, rub or gallop GI: soft, non-tender; bowel sounds normal; no masses,  no organomegaly Extremities: extremities normal, atraumatic, no cyanosis or edema Pulses: 2+ and symmetric Skin: Skin color, texture, turgor normal. No rashes or lesions Neurologic: Grossly normal Incision/Wound: na  Assessment/Plan X-rays of her hand reveals a fracture of her distal wrist left side. Dx: CTS Rt Plan: CTR rT  Marie Farmer R 06/10/2013, 5:28 AM

## 2013-06-10 NOTE — Brief Op Note (Signed)
06/10/2013  9:15 AM  PATIENT:  Marie Farmer  57 y.o. female  PRE-OPERATIVE DIAGNOSIS:  RIGHT CARPAL TUNNEL SYNDROME  POST-OPERATIVE DIAGNOSIS:  RIGHT CARPAL TUNNEL SYNDROME  PROCEDURE:  Procedure(s): CARPAL TUNNEL RELEASE (Right)  SURGEON:  Surgeon(s) and Role:    * Nicki Reaper, MD - Primary  PHYSICIAN ASSISTANT:   ASSISTANTS: none   ANESTHESIA:   local and regional  EBL:  Total I/O In: 600 [I.V.:600] Out: -   BLOOD ADMINISTERED:none  DRAINS: none   LOCAL MEDICATIONS USED:  MARCAINE     SPECIMEN:  No Specimen  DISPOSITION OF SPECIMEN:  N/A  COUNTS:  YES  TOURNIQUET:   Total Tourniquet Time Documented: Forearm (Right) - 20 minutes Total: Forearm (Right) - 20 minutes   DICTATION: .Other Dictation: Dictation Number [id number]  PLAN OF CARE: Discharge to home after PACU  PATIENT DISPOSITION:  PACU - hemodynamically stable.

## 2013-06-11 ENCOUNTER — Encounter (HOSPITAL_BASED_OUTPATIENT_CLINIC_OR_DEPARTMENT_OTHER): Payer: Self-pay | Admitting: Orthopedic Surgery

## 2013-06-11 NOTE — Op Note (Deleted)
NAMEHEDDY, VIDANA               ACCOUNT NO.:  [id number]  MEDICAL RECORD NO.:  [id number]  LOCATION:  DOIB                         FACILITY:  [facility]  PHYSICIAN:  Cindee Salt, M.D.       DATE OF BIRTH:  12-Oct-1955  DATE OF PROCEDURE:  06/10/2013 DATE OF DISCHARGE:  06/10/2013                              OPERATIVE REPORT   PREOPERATIVE DIAGNOSIS:  Carpal tunnel syndrome, right hand.  POSTOPERATIVE DIAGNOSIS:  Carpal tunnel syndrome, right hand.  OPERATION:  Decompression right median nerve.  SURGEON:  Cindee Salt, M.D.  ANESTHESIA:  Forearm-based IV regional with local infiltration.  ANESTHESIOLOGIST:  Sheldon Silvan, MD  HISTORY:  The patient is a 57 year old female with a history of carpal tunnel syndrome, nerve conductions positive, this has not responded to conservative treatment.  She has elected to undergo surgical release. Pre, peri, and postoperative course have been discussed along with risks and complications.  She is aware that there is no guarantee with surgery; possibility of infection; recurrence of injury to arteries, nerves, tendons, incomplete relief of symptoms, dystrophy.  In the preoperative area, the patient is seen, the extremity marked by both the patient and surgeon.  Antibiotic given.  PROCEDURE IN DETAIL:  The patient was brought to the operating room where a forearm-based IV regional anesthetic was carried out without difficulty.  She was prepped using ChloraPrep, supine position, right arm free.  A 3-minute dry time was allowed.  Time-out taken, confirming the patient and procedure.  A longitudinal incision was made in the right palm, carried down through subcutaneous tissue.  Bleeders were electrocauterized with bipolar.  Palmar fascia was split.  Superficial palmar arch identified.  Flexor tendon to the ring little finger was identified to the ulnar side of the median nerve.  Carpal retinaculum was incised with sharp dissection after placement of  retractors protecting both median and ulnar nerves.  A right-angle and Sewall retractor was then placed between skin and forearm fascia.  Dissection forearm fascia from the overlying subcutaneous tissue and skin.  Under direct vision, the canal was released for approximately 2-3 cm proximal to the wrist crease under direct vision.  Canal was explored.  Air compression to the nerve was apparent.  No further lesions were identified.  Motor branch entered in the muscle.  The wound was copiously irrigated with saline and the skin closed with interrupted 4-0 Vicryl Rapide sutures.  Local infiltration with 0.25% Marcaine without epinephrine was given, 7 mL was used.  Sterile compressive dressing with the fingers free was applied.  On deflation of the tourniquet, all fingers immediately pinked.  She was taken to the recovery room for observation in satisfactory condition.  She will be discharged home to return in 1 week on hydrocodone.          ______________________________ Cindee Salt, M.D.     GK/MEDQ  D:  06/10/2013  T:  06/11/2013  Job:  [id number]

## 2013-06-11 NOTE — Op Note (Signed)
NAME:  Marie Farmer, Marie Farmer               ACCOUNT NO.:  [id number]  MEDICAL RECORD NO.:  [id number]  LOCATION:  DOIB                         FACILITY:  [facility]  PHYSICIAN:  Mckynlee Luse, M.D.       DATE OF BIRTH:  06/14/1956  DATE OF PROCEDURE:  06/10/2013 DATE OF DISCHARGE:  06/10/2013                              OPERATIVE REPORT   PREOPERATIVE DIAGNOSIS:  Carpal tunnel syndrome, right hand.  POSTOPERATIVE DIAGNOSIS:  Carpal tunnel syndrome, right hand.  OPERATION:  Decompression right median nerve.  SURGEON:  Keeyon Privitera, M.D.  ANESTHESIA:  Forearm-based IV regional with local infiltration.  ANESTHESIOLOGIST:  David Crews, MD  HISTORY:  The patient is a 57-year-old female with a history of carpal tunnel syndrome, nerve conductions positive, this has not responded to conservative treatment.  She has elected to undergo surgical release. Pre, peri, and postoperative course have been discussed along with risks and complications.  She is aware that there is no guarantee with surgery; possibility of infection; recurrence of injury to arteries, nerves, tendons, incomplete relief of symptoms, dystrophy.  In the preoperative area, the patient is seen, the extremity marked by both the patient and surgeon.  Antibiotic given.  PROCEDURE IN DETAIL:  The patient was brought to the operating room where a forearm-based IV regional anesthetic was carried out without difficulty.  She was prepped using ChloraPrep, supine position, right arm free.  A 3-minute dry time was allowed.  Time-out taken, confirming the patient and procedure.  A longitudinal incision was made in the right palm, carried down through subcutaneous tissue.  Bleeders were electrocauterized with bipolar.  Palmar fascia was split.  Superficial palmar arch identified.  Flexor tendon to the ring little finger was identified to the ulnar side of the median nerve.  Carpal retinaculum was incised with sharp dissection after placement of  retractors protecting both median and ulnar nerves.  A right-angle and Sewall retractor was then placed between skin and forearm fascia.  Dissection forearm fascia from the overlying subcutaneous tissue and skin.  Under direct vision, the canal was released for approximately 2-3 cm proximal to the wrist crease under direct vision.  Canal was explored.  Air compression to the nerve was apparent.  No further lesions were identified.  Motor branch entered in the muscle.  The wound was copiously irrigated with saline and the skin closed with interrupted 4-0 Vicryl Rapide sutures.  Local infiltration with 0.25% Marcaine without epinephrine was given, 7 mL was used.  Sterile compressive dressing with the fingers free was applied.  On deflation of the tourniquet, all fingers immediately pinked.  She was taken to the recovery room for observation in satisfactory condition.  She will be discharged home to return in 1 week on hydrocodone.          ______________________________ Azriella Mattia, M.D.     GK/MEDQ  D:  06/10/2013  T:  06/11/2013  Job:  [id number] 

## 2013-06-17 ENCOUNTER — Encounter: Payer: Self-pay | Admitting: Internal Medicine

## 2013-06-17 ENCOUNTER — Ambulatory Visit (INDEPENDENT_AMBULATORY_CARE_PROVIDER_SITE_OTHER): Payer: BC Managed Care – PPO | Admitting: Internal Medicine

## 2013-06-17 VITALS — BP 147/80 | HR 62 | Temp 97.6°F | Ht 63.0 in | Wt 200.2 lb

## 2013-06-17 DIAGNOSIS — K219 Gastro-esophageal reflux disease without esophagitis: Secondary | ICD-10-CM

## 2013-06-17 MED ORDER — SUCRALFATE 1 GM/10ML PO SUSP: 1.0000 g | Freq: Three times a day (TID) | ORAL | Status: DC

## 2013-06-17 NOTE — Patient Instructions (Addendum)
Change Dexilant dosing to 60 mg before noon daily  Start Carafate suspension 1 gram before meals and bedtime as needed  Weight loss encouraged  GERD information provided  Office visit in 2 months

## 2013-06-17 NOTE — Progress Notes (Signed)
Primary Care Physician:  Louie Boston, MD Primary Gastroenterologist:  Dr. Jena Gauss  Pre-Procedure History & Physical: HPI:  Marie Farmer is a 57 y.o. female here for followup of GERD. Was doing better on Dexilant 60 mg daily earlier in the year. However, in the setting of obesity and progressive weight gain she started having breakthrough symptoms particularly later in night and in the morning. Sometimes consumes late night meals. No dysphagia. Last EGD in [place] - Small hiatal hernia. No Barrett's reported.  Just had right carpal tunnel surgery  Past Medical History  Diagnosis Date  . Hypertension   . GERD (gastroesophageal reflux disease)   . Mood disorder   . Anxiety   . Venous insufficiency   . Fibromyalgia   . DDD (degenerative disc disease)     chronic back pain  . IBS (irritable bowel syndrome)   . Foot pain   . Asthma   . Wears glasses     Past Surgical History  Procedure Laterality Date  . Knee arthroplasty    . Esophagogastroduodenoscopy  04/10/2003    Normal esophagus/Antral erosions, as described above.  The remainder of the stomach appeared normal.  Normal first and second parts of the duodenum.  . Esophagogastroduodenoscopy  12/20/11    [name]-hiatus hernia/chronic gastritis/normal duodenumNEGATIVE H pylori, negative SB biopsy  . Colonoscopy  12/20/11    [name]-single diminutive sessile polyp in the sigmoid colon/small interenal hemorrhoids  . Tibia fracture surgery  2012    right  . Carpal tunnel release Right 06/10/2013    Procedure: CARPAL TUNNEL RELEASE;  Surgeon: Nicki Reaper, MD;  Location: [facility];  Service: Orthopedics;  Laterality: Right;    Prior to Admission medications   Medication Sig Start Date End Date Taking? Authorizing Provider  ALPRAZolam (XANAX) 0.25 MG tablet Take 0.25 mg by mouth at bedtime as needed.    Yes Historical Provider, MD  benazepril-hydrochlorthiazide (LOTENSIN HCT) 20-25 MG per tablet Take 1 tablet by  mouth every morning.     Yes Historical Provider, MD  dexlansoprazole (DEXILANT) 60 MG capsule Take twice a day for one month, then daily. 07/04/12  Yes Tiffany Kocher, PA-C  ibuprofen (ADVIL,MOTRIN) 200 MG tablet Take 200 mg by mouth every 6 (six) hours as needed.   Yes Historical Provider, MD  pregabalin (LYRICA) 100 MG capsule Take 100 mg by mouth 2 (two) times daily.   Yes Historical Provider, MD  calcium-vitamin D (OSCAL WITH D) 500-200 MG-UNIT per tablet Take 1 tablet by mouth daily.    Historical Provider, MD  HYDROcodone-acetaminophen (NORCO) 5-325 MG per tablet Take 1 tablet by mouth every 6 (six) hours as needed for pain. 06/10/13   Nicki Reaper, MD  sucralfate (CARAFATE) 1 GM/10ML suspension Take 10 mLs (1 g total) by mouth 4 (four) times daily -  before meals and at bedtime. 06/17/13   Corbin Ade, MD  vitamin B-12 (CYANOCOBALAMIN) 1000 MCG tablet Take 1,000 mcg by mouth daily.    Historical Provider, MD    Allergies as of 06/17/2013 - Review Complete 06/17/2013  Allergen Reaction Noted  . Adhesive [tape]  05/12/2011  . Cymbalta [duloxetine hcl]  06/04/2013  . Duloxetine    . Latex  05/12/2011  . Nabumetone    . Azithromycin Rash 02/29/2012    Family History  Problem Relation Age of Onset  . Diabetes Mother   . Coronary artery disease Mother   . COPD Mother   . Lung cancer Father  History   Social History  . Marital Status: Married    Spouse Name: N/A    Number of Children: 2  . Years of Education: N/A   Occupational History  . unemployed    Social History Main Topics  . Smoking status: Never Smoker   . Smokeless tobacco: Not on file  . Alcohol Use: Yes     Comment: socially, couple times per week  . Drug Use: No     Comment: remote maijuana  . Sexual Activity: No   Other Topics Concern  . Not on file   Social History Narrative   Lives w/ sister & her husband & daughter    Review of Systems: See HPI, otherwise negative ROS  Physical Exam: BP  147/80  Pulse 62  Temp(Src) 97.6 F (36.4 C) (Oral)  Ht 5\' 3"  (1.6 m)  Wt 200 lb 3.2 oz (90.81 kg)  BMI 35.47 kg/m2 General:   Obese. Alert,  Well-developed, well-nourished, pleasant and cooperative in NAD Skin:  Intact without significant lesions or rashes. Eyes:  Sclera clear, no icterus.   Conjunctiva pink. Ears:  Normal auditory acuity. Nose:  No deformity, discharge,  or lesions. Mouth:  No deformity or lesions. Neck:  Supple; no masses or thyromegaly. No significant cervical adenopathy. Lungs:  Clear throughout to auscultation.   No wheezes, crackles, or rhonchi. No acute distress. Heart:  Regular rate and rhythm; no murmurs, clicks, rubs,  or gallops. Abdomen: Non-distended, normal bowel sounds.  Soft and nontender without appreciable mass or hepatosplenomegaly.  Pulses:  Normal pulses noted. Extremities:  Without clubbing or edema.   Impression:  Pleasant 57 year old lady with recurrent reflux symptoms in the setting of progressive obesity. Poor dietary habits. No alarm symptoms. I discussed the multipronged approach related to the treatment of GERD with her today. The importance of weight loss emphasized.   Recommendations: Change Dexilant dosing to 60 mg before noon daily  Start Carafate suspension 1 gram before meals and bedtime as needed  Weight loss encouraged  GERD information provided  Office visit in 2 months

## 2013-06-18 ENCOUNTER — Encounter: Payer: Self-pay | Admitting: Internal Medicine

## 2013-07-03 ENCOUNTER — Encounter: Payer: Self-pay | Admitting: Podiatrist

## 2013-07-03 ENCOUNTER — Ambulatory Visit (INDEPENDENT_AMBULATORY_CARE_PROVIDER_SITE_OTHER): Payer: BC Managed Care – PPO | Admitting: Podiatrist

## 2013-07-03 VITALS — BP 114/71 | HR 63 | Resp 16 | Ht 63.0 in | Wt 192.0 lb

## 2013-07-03 DIAGNOSIS — M722 Plantar fascial fibromatosis: Secondary | ICD-10-CM

## 2013-07-03 NOTE — Patient Instructions (Signed)
Wear your boot as much as you are able during the day- you may remove at night  Plantar Fasciitis Plantar fasciitis is a common condition that causes foot pain. It is soreness (inflammation) of the band of tough fibrous tissue on the bottom of the foot that runs from the heel bone (calcaneus) to the ball of the foot. The cause of this soreness may be from excessive standing, poor fitting shoes, running on hard surfaces, being overweight, having an abnormal walk, or overuse (this is common in runners) of the painful foot or feet. It is also common in aerobic exercise dancers and ballet dancers. SYMPTOMS  Most people with plantar fasciitis complain of:  Severe pain in the morning on the bottom of their foot especially when taking the first steps out of bed. This pain recedes after a few minutes of walking.  Severe pain is experienced also during walking following a long period of inactivity.  Pain is worse when walking barefoot or up stairs DIAGNOSIS   Your caregiver will diagnose this condition by examining and feeling your foot.  Special tests such as X-rays of your foot, are usually not needed. PREVENTION   Consult a sports medicine professional before beginning a new exercise program.  Walking programs offer a good workout. With walking there is a lower chance of overuse injuries common to runners. There is less impact and less jarring of the joints.  Begin all new exercise programs slowly. If problems or pain develop, decrease the amount of time or distance until you are at a comfortable level.  Wear good shoes and replace them regularly.  Stretch your foot and the heel cords at the back of the ankle (Achilles tendon) both before and after exercise.  Run or exercise on even surfaces that are not hard. For example, asphalt is better than pavement.  Do not run barefoot on hard surfaces.  If using a treadmill, vary the incline.  Do not continue to workout if you have foot or joint  problems. Seek professional help if they do not improve. HOME CARE INSTRUCTIONS   Avoid activities that cause you pain until you recover.  Use ice or cold packs on the problem or painful areas after working out.  Only take over-the-counter or prescription medicines for pain, discomfort, or fever as directed by your caregiver.  Soft shoe inserts or athletic shoes with air or gel sole cushions may be helpful.  If problems continue or become more severe, consult a sports medicine caregiver or your own health care provider. Cortisone is a potent anti-inflammatory medication that may be injected into the painful area. You can discuss this treatment with your caregiver. MAKE SURE YOU:   Understand these instructions.  Will watch your condition.  Will get help right away if you are not doing well or get worse. Document Released: 05/23/2001 Document Revised: 11/20/2011 Document Reviewed: 07/22/2008 [facility] Patient Information 2014 [place], [place].

## 2013-07-09 NOTE — Progress Notes (Signed)
Patient presents today for followup of plantar fasciitis. Patient states her foot is severely uncomfortable and she can't put any weight on it and she refers to the right foot. She also states that the left foot is uncomfortable as well.  Objective: Neurovascular status is intact to bilateral feet. Continued plantar fasciitis symptomatology is noted.  Assessment is relapsing plantar fasciitis right greater than  Plan: Patient has received steroid injections in the past which are now minimally beneficial. I recommended an offloading boot of which she was given instructions for use. We also discussed shoe gear changes and a home physical therapy. She will be seen back in 4 weeks for recheck and reevaluation of the foot.

## 2013-08-01 ENCOUNTER — Ambulatory Visit: Payer: BC Managed Care – PPO | Admitting: Podiatrist

## 2013-08-19 ENCOUNTER — Encounter: Payer: Self-pay | Admitting: Internal Medicine

## 2014-01-12 ENCOUNTER — Other Ambulatory Visit: Payer: Self-pay | Admitting: Gastroenterology

## 2014-02-06 ENCOUNTER — Ambulatory Visit: Payer: BC Managed Care – PPO | Admitting: Podiatrist

## 2014-02-12 ENCOUNTER — Ambulatory Visit (INDEPENDENT_AMBULATORY_CARE_PROVIDER_SITE_OTHER): Payer: BC Managed Care – PPO

## 2014-02-12 ENCOUNTER — Ambulatory Visit (INDEPENDENT_AMBULATORY_CARE_PROVIDER_SITE_OTHER): Payer: BC Managed Care – PPO | Admitting: Podiatry

## 2014-02-12 VITALS — BP 127/67 | HR 64 | Resp 16

## 2014-02-12 DIAGNOSIS — M779 Enthesopathy, unspecified: Secondary | ICD-10-CM

## 2014-02-12 DIAGNOSIS — M7750 Other enthesopathy of unspecified foot: Secondary | ICD-10-CM

## 2014-02-12 MED ORDER — TRIAMCINOLONE ACETONIDE 10 MG/ML IJ SUSP
10.0000 mg | Freq: Once | INTRAMUSCULAR | Status: AC
  Administered 2014-02-12: 10 mg

## 2014-02-12 NOTE — Progress Notes (Signed)
Subjective:     Patient ID: Marie Farmer, female   DOB: 04-Apr-1956, 58 y.o.   MRN: [id number]  HPI patient states I'm getting a lot of pain in my right ankle and the medial side of my left foot and my heels continue to give me trouble on both feet. States the new pains have become worse   Review of Systems     Objective:   Physical Exam Neurovascular status intact with quite a bit of discomfort in the sinus tarsi subtalar joint right and discomfort posterior tib tendon near its insertion into the navicular left but also discomfort in the plantar heel of both feet    Assessment:     Possibility that plantar fasciitis causes change in gait leading to these other symptoms she is experiencing    Plan:     X-rays an H&P reviewed today I injected the sinus tarsi right 3 mg Kenalog 5 minutes I can Marcaine mixture and the left posterior tib tendon insertion 3 mg dexamethasone Kenalog 5 mg Xylocaine area instructed on orthotics and to bring those in next visit and we may have to consider a new pair depending on how they look

## 2014-02-19 ENCOUNTER — Ambulatory Visit (INDEPENDENT_AMBULATORY_CARE_PROVIDER_SITE_OTHER): Payer: BC Managed Care – PPO | Admitting: Podiatry

## 2014-02-19 ENCOUNTER — Encounter: Payer: Self-pay | Admitting: Podiatry

## 2014-02-19 VITALS — BP 128/74 | HR 78 | Resp 16

## 2014-02-19 DIAGNOSIS — M7750 Other enthesopathy of unspecified foot: Secondary | ICD-10-CM

## 2014-02-19 NOTE — Progress Notes (Signed)
Pt still having pain in both feet. Has noted some improvement in her right foot. Pain is intermittent with walking

## 2014-02-19 NOTE — Progress Notes (Signed)
Subjective:     Patient ID: Marie Farmer, female   DOB: 27-Sep-1955, 58 y.o.   MRN: [id number]  HPI patient states that she is still having heel pain left over right with inflammation noted upon palpation   Review of Systems     Objective:   Physical Exam Neurovascular status intact with left heel pain at the insertion of the tendon to the calcaneus    Assessment:     Continued plantar fasciitis left over right heel    Plan:     H&P performed condition discussed and long-term orthotics recommended which patient will decide on. Today dispensed night splint with all instructions on usage along with ice therapy and utilization of fascially brace. Reappoint as symptoms indicate

## 2014-02-23 ENCOUNTER — Ambulatory Visit (INDEPENDENT_AMBULATORY_CARE_PROVIDER_SITE_OTHER): Payer: BC Managed Care – PPO | Admitting: *Deleted

## 2014-02-23 DIAGNOSIS — M779 Enthesopathy, unspecified: Secondary | ICD-10-CM

## 2014-02-23 NOTE — Progress Notes (Signed)
   Subjective:    Patient ID: Marie Farmer, female    DOB: 05/20/1956, 58 y.o.   MRN: [id number]  HPI  SCAN FOR ORTHOTICS.    Review of Systems     Objective:   Physical Exam        Assessment & Plan:

## 2014-02-25 ENCOUNTER — Telehealth: Payer: Self-pay | Admitting: *Deleted

## 2014-02-25 NOTE — Telephone Encounter (Signed)
I'd like him to call me in a prescription for Celebrex to [facility].  That number is [phone].  Call me back and let me know.

## 2014-02-26 MED ORDER — CELECOXIB 200 MG PO CAPS: 200.0000 mg | ORAL_CAPSULE | Freq: Every day | ORAL | Status: DC

## 2014-02-26 NOTE — Telephone Encounter (Signed)
I called and informed the patient that we sent the [facility] to [facility] [place].  She stated I appreciate it, thank you.

## 2014-03-06 ENCOUNTER — Ambulatory Visit (INDEPENDENT_AMBULATORY_CARE_PROVIDER_SITE_OTHER): Payer: BC Managed Care – PPO | Admitting: *Deleted

## 2014-03-06 DIAGNOSIS — M722 Plantar fascial fibromatosis: Secondary | ICD-10-CM

## 2014-03-06 NOTE — Progress Notes (Signed)
   Subjective:    Patient ID: Marie Farmer, female    DOB: Aug 25, 1956, 58 y.o.   MRN: [id number]  HPI  Pick up orthotics and given instruction.    Review of Systems     Objective:   Physical Exam        Assessment & Plan:

## 2014-03-06 NOTE — Patient Instructions (Signed)

## 2014-03-20 ENCOUNTER — Other Ambulatory Visit: Payer: BC Managed Care – PPO

## 2014-08-14 ENCOUNTER — Ambulatory Visit: Payer: BC Managed Care – PPO | Admitting: Podiatrist

## 2015-02-09 ENCOUNTER — Other Ambulatory Visit: Payer: Self-pay

## 2015-02-10 MED ORDER — DEXLANSOPRAZOLE 60 MG PO CPDR: 60.0000 mg | DELAYED_RELEASE_CAPSULE | Freq: Every day | ORAL | Status: DC

## 2015-03-02 ENCOUNTER — Encounter: Payer: Self-pay | Admitting: Internal Medicine

## 2015-03-02 ENCOUNTER — Encounter: Payer: Self-pay | Admitting: Nurse Practitioner

## 2015-03-02 ENCOUNTER — Ambulatory Visit (INDEPENDENT_AMBULATORY_CARE_PROVIDER_SITE_OTHER): Payer: Medicare HMO | Admitting: Nurse Practitioner

## 2015-03-02 VITALS — BP 116/78 | HR 70 | Temp 97.3°F | Ht 62.0 in | Wt 205.2 lb

## 2015-03-02 DIAGNOSIS — K219 Gastro-esophageal reflux disease without esophagitis: Secondary | ICD-10-CM

## 2015-03-02 MED ORDER — DEXLANSOPRAZOLE 60 MG PO CPDR: 60.0000 mg | DELAYED_RELEASE_CAPSULE | Freq: Every day | ORAL | Status: DC

## 2015-03-02 NOTE — Patient Instructions (Signed)
1. Since in a prescription to of Dexilant to your pharmacy. 2. I provided you with a co-pay card to help with the cost of the [place] while you are still on commercial insurance. 3. Return follow-up in 3 months.

## 2015-03-02 NOTE — Progress Notes (Signed)
Referring Provider: Deloria Lair., MD Primary Care Physician:  Deloria Lair, MD Primary GI:  Dr. Gala Romney  Chief Complaint  Patient presents with  . Medication Refill  . Gastrophageal Reflux    HPI:   59 year old female presents for follow-up on GERD symptoms. Was on Dexilant. Per the chief complaint the office visit states that Dexon is become too expensive. PCP note reviewed. Patient discontinued her Dexilant, which had been working well after trial and failure of multiple other PPIs, however cost issues requiring her to stop Dexilant. Now taking Prevacid 30 mg and times at night. Today she states she had medicare. She is going through a divorce and is still on her husband's insurance and was able to get a refill on Dexilant for the past 2 weeks. This has improved some, but still with some breakthrough symptoms. Burning has improved, does have sour taste and nausea. Does occasionally take TUMS. Deneis abdominal pain. Denies hematochezia and melena. Denies chest pain, dyspnea, dizziness, lightheadedness, syncope, near syncope. Denies any other upper or lower GI symptoms.  Past Medical History  Diagnosis Date  . Hypertension   . GERD (gastroesophageal reflux disease)   . Mood disorder   . Anxiety   . Venous insufficiency   . Fibromyalgia   . DDD (degenerative disc disease)     chronic back pain  . IBS (irritable bowel syndrome)   . Foot pain   . Asthma   . Wears glasses     Past Surgical History  Procedure Laterality Date  . Knee arthroplasty    . Esophagogastroduodenoscopy  04/10/2003    Normal esophagus/Antral erosions, as described above.  The remainder of the stomach appeared normal.  Normal first and second parts of the duodenum.  . Esophagogastroduodenoscopy  12/20/11    [name]-hiatus hernia/chronic gastritis/normal duodenumNEGATIVE H pylori, negative SB biopsy  . Colonoscopy  12/20/11    [name]-single diminutive sessile polyp in the sigmoid colon/small  interenal hemorrhoids  . Tibia fracture surgery  2012    right  . Carpal tunnel release Right 06/10/2013    Procedure: CARPAL TUNNEL RELEASE;  Surgeon: Wynonia Sours, MD;  Location: [facility];  Service: Orthopedics;  Laterality: Right;    Current Outpatient Prescriptions  Medication Sig Dispense Refill  . benazepril-hydrochlorthiazide (LOTENSIN HCT) 20-25 MG per tablet Take by mouth.    . Biotin 300 MCG TABS Take by mouth. womens hair and nail    . dexlansoprazole (DEXILANT) 60 MG capsule Take 1 capsule (60 mg total) by mouth daily. 90 capsule 1  . FeFum-FePoly-FA-B Cmp-C-Biot (INTEGRA PLUS PO) Take by mouth.    Marland Kitchen ibuprofen (ADVIL,MOTRIN) 200 MG tablet Take 200 mg by mouth every 6 (six) hours as needed.    Marland Kitchen UNABLE TO FIND 1 capsule daily. Med Name: gnc womens menopause formula    . ALPRAZolam (XANAX) 1 MG tablet Take by mouth.    . celecoxib (CELEBREX) 200 MG capsule Take 1 capsule (200 mg total) by mouth daily. (Patient not taking: Reported on 03/02/2015) 30 capsule 2  . lansoprazole (PREVACID) 15 MG capsule Take 15 mg by mouth 2 (two) times daily before a meal.    . pantoprazole (PROTONIX) 40 MG tablet TAKE 1 TABLET BY MOUTH EVERY MORNING ON EMPTY STOMACH WAIT 45 MIN BEFORE EATING  1  . ranitidine (ZANTAC) 300 MG tablet TAKE 1 TABLET BY MOUTH DAILY AT BEDTIME FOR ACID REFLUX  1  . sucralfate (CARAFATE) 1 GM/10ML suspension Take 10 mLs (1 g  total) by mouth 4 (four) times daily -  before meals and at bedtime. (Patient not taking: Reported on 03/02/2015) 420 mL 3  . zolpidem (AMBIEN CR) 12.5 MG CR tablet Take by mouth.     No current facility-administered medications for this visit.    Allergies as of 03/02/2015 - Review Complete 03/02/2015  Allergen Reaction Noted  . Adhesive [tape]  05/12/2011  . Cymbalta [duloxetine hcl]  06/04/2013  . Duloxetine    . Latex  05/12/2011  . Nabumetone    . Azithromycin Rash 02/29/2012    Family History  Problem Relation Age of Onset    . Diabetes Mother   . Coronary artery disease Mother   . COPD Mother   . Lung cancer Father     History   Social History  . Marital Status: Married    Spouse Name: N/A  . Number of Children: 2  . Years of Education: N/A   Occupational History  . unemployed    Social History Main Topics  . Smoking status: Never Smoker   . Smokeless tobacco: Not on file  . Alcohol Use: Yes     Comment: socially, couple times per week  . Drug Use: No     Comment: remote maijuana  . Sexual Activity: No   Other Topics Concern  . None   Social History Narrative   Lives w/ sister & her husband & daughter    Review of Systems: General: Negative for anorexia, weight loss, fever, chills, fatigue, weakness. ENT: Negative for hoarseness, difficulty swallowing. CV: Negative for chest pain, angina, palpitations, peripheral edema.  Respiratory: Negative for dyspnea at rest, cough, sputum, wheezing.  GI: See history of present illness. Endo: Negative for unusual weight change.  Heme: Negative for bruising or bleeding.   Physical Exam: BP 116/78 mmHg  Pulse 70  Temp(Src) 97.3 F (36.3 C)  Ht 5\' 2"  (1.575 m)  Wt 205 lb 3.2 oz (93.078 kg)  BMI 37.52 kg/m2 General:   Alert and oriented. Pleasant and cooperative. Well-nourished and well-developed.  Head:  Normocephalic and atraumatic. Eyes:  Without icterus, sclera clear and conjunctiva pink.  Ears:  Normal auditory acuity. Cardiovascular:  S1, S2 present without murmurs appreciated. Normal pulses noted. Extremities without clubbing or edema. Respiratory:  Clear to auscultation bilaterally. No wheezes, rales, or rhonchi. No distress.  Gastrointestinal:  +BS, soft, non-tender and non-distended. No HSM noted. No guarding or rebound. No masses appreciated.  Rectal:  Deferred  Neurologic:  Alert and oriented x4;  grossly normal neurologically. Psych:  Alert and cooperative. Normal mood and affect. Heme/Lymph/Immune: No excessive bruising  noted.    03/02/2015 1:54 PM

## 2015-03-02 NOTE — Assessment & Plan Note (Signed)
Patient was able to get a refill on her Dexilant approximately 30 days ago and symptoms have begun improving on this. We'll send in a refill for the Dexilant 60 mg daily with a 90 day supply and one refill. Return for follow-up visit in 3 months. At this point we'll determine if she still has insurance and with possibilities are forgetting Dexilant coverage moving forward.

## 2015-03-02 NOTE — Progress Notes (Signed)
cc'ed to pcp °

## 2015-06-02 ENCOUNTER — Encounter: Payer: Self-pay | Admitting: Nurse Practitioner

## 2015-06-02 ENCOUNTER — Ambulatory Visit (INDEPENDENT_AMBULATORY_CARE_PROVIDER_SITE_OTHER): Payer: BLUE CROSS/BLUE SHIELD | Admitting: Nurse Practitioner

## 2015-06-02 VITALS — BP 122/77 | HR 66 | Temp 97.6°F | Ht 64.0 in | Wt 206.2 lb

## 2015-06-02 DIAGNOSIS — K219 Gastro-esophageal reflux disease without esophagitis: Secondary | ICD-10-CM | POA: Diagnosis not present

## 2015-06-02 NOTE — Progress Notes (Signed)
Referring Provider: Deloria Lair., MD Primary Care Physician:  Deloria Lair, MD Primary GI:  Dr. Gala Romney  Chief Complaint  Patient presents with  . Follow-up    HPI:   59 year old female presents for follow-up on GERD. Last seen in our office 03/02/2015. At time it was found that she did stop taking Dexilant because of the cost and was put back on Prevacid 30 mg. She is having breakthrough symptoms and was placed back on Dexilant. Last EGD 04/10/2003.  Today she states her GERD symptoms are better controlled on Dexilant. She is losing her insurance due to divorce soon and isn't sure if she'll be able to get more medication. Denies abdominal pain, N/V, hematochezia, melena, fever, chills, unintentional weight loss. Denies chest pain, dyspnea, dizziness, lightheadedness, syncope, near syncope. Denies any other upper or lower GI symptoms.  Past Medical History  Diagnosis Date  . Hypertension   . GERD (gastroesophageal reflux disease)   . Mood disorder   . Anxiety   . Venous insufficiency   . Fibromyalgia   . DDD (degenerative disc disease)     chronic back pain  . IBS (irritable bowel syndrome)   . Foot pain   . Asthma   . Wears glasses     Past Surgical History  Procedure Laterality Date  . Knee arthroplasty    . Esophagogastroduodenoscopy  04/10/2003    Normal esophagus/Antral erosions, as described above.  The remainder of the stomach appeared normal.  Normal first and second parts of the duodenum.  . Esophagogastroduodenoscopy  12/20/11    [name]-hiatus hernia/chronic gastritis/normal duodenumNEGATIVE H pylori, negative SB biopsy  . Colonoscopy  12/20/11    [name]-single diminutive sessile polyp in the sigmoid colon/small interenal hemorrhoids  . Tibia fracture surgery  2012    right  . Carpal tunnel release Right 06/10/2013    Procedure: CARPAL TUNNEL RELEASE;  Surgeon: Wynonia Sours, MD;  Location: [facility];  Service: Orthopedics;   Laterality: Right;    Current Outpatient Prescriptions  Medication Sig Dispense Refill  . ALPRAZolam (XANAX) 1 MG tablet Take by mouth.    . benazepril-hydrochlorthiazide (LOTENSIN HCT) 20-25 MG per tablet Take by mouth.    . Biotin 300 MCG TABS Take by mouth. womens hair and nail    . celecoxib (CELEBREX) 200 MG capsule Take 1 capsule (200 mg total) by mouth daily. 30 capsule 2  . dexlansoprazole (DEXILANT) 60 MG capsule Take 1 capsule (60 mg total) by mouth daily. 90 capsule 1  . FeFum-FePoly-FA-B Cmp-C-Biot (INTEGRA PLUS PO) Take by mouth.    Marland Kitchen ibuprofen (ADVIL,MOTRIN) 200 MG tablet Take 200 mg by mouth every 6 (six) hours as needed.    . lansoprazole (PREVACID) 15 MG capsule Take 15 mg by mouth 2 (two) times daily before a meal.    . UNABLE TO FIND 1 capsule daily. Med Name: gnc womens menopause formula    . zolpidem (AMBIEN CR) 12.5 MG CR tablet Take by mouth.    . pantoprazole (PROTONIX) 40 MG tablet TAKE 1 TABLET BY MOUTH EVERY MORNING ON EMPTY STOMACH WAIT 45 MIN BEFORE EATING  1  . ranitidine (ZANTAC) 300 MG tablet TAKE 1 TABLET BY MOUTH DAILY AT BEDTIME FOR ACID REFLUX  1  . sucralfate (CARAFATE) 1 GM/10ML suspension Take 10 mLs (1 g total) by mouth 4 (four) times daily -  before meals and at bedtime. (Patient not taking: Reported on 06/02/2015) 420 mL 3   No current facility-administered medications  for this visit.    Allergies as of 06/02/2015 - Review Complete 06/02/2015  Allergen Reaction Noted  . Adhesive [tape]  05/12/2011  . Cymbalta [duloxetine hcl]  06/04/2013  . Duloxetine    . Latex  05/12/2011  . Nabumetone    . Azithromycin Rash 02/29/2012    Family History  Problem Relation Age of Onset  . Diabetes Mother   . Coronary artery disease Mother   . COPD Mother   . Lung cancer Father     Social History   Social History  . Marital Status: Married    Spouse Name: N/A  . Number of Children: 2  . Years of Education: N/A   Occupational History  .  unemployed    Social History Main Topics  . Smoking status: Never Smoker   . Smokeless tobacco: None  . Alcohol Use: Yes     Comment: socially, couple times per week  . Drug Use: No     Comment: remote maijuana  . Sexual Activity: No   Other Topics Concern  . None   Social History Narrative   Lives w/ sister & her husband & daughter    Review of Systems: General: Negative for anorexia, weight loss, fever, chills, fatigue, weakness. Respiratory: Negative for dyspnea at rest, dyspnea on exertion, cough, sputum, wheezing.  GI: See history of present illness. Endo: Negative for unusual weight change.  Allergy: Negative for rash or hives.   Physical Exam: BP 122/77 mmHg  Pulse 66  Temp(Src) 97.6 F (36.4 C) (Oral)  Ht 5\' 4"  (1.626 m)  Wt 206 lb 3.2 oz (93.532 kg)  BMI 35.38 kg/m2 General:   Alert and oriented. Pleasant and cooperative. Well-nourished and well-developed.  Head:  Normocephalic and atraumatic. Cardiovascular:  S1, S2 present without murmurs appreciated. Normal pulses noted. Extremities without clubbing or edema. Respiratory:  Clear to auscultation bilaterally. No wheezes, rales, or rhonchi. No distress.  Gastrointestinal:  +BS, rounded but soft, non-tender and non-distended. No HSM noted. No guarding or rebound. No masses appreciated.  Rectal:  Deferred  Psych:  Alert and cooperative. Normal mood and affect.    06/02/2015 10:15 AM .

## 2015-06-02 NOTE — Progress Notes (Signed)
CC'D TO PCP °

## 2015-06-02 NOTE — Patient Instructions (Signed)
1. Into needed and Dexilant. 2. We will [employer] with patient assistance paperwork outpatient for the costs. 3. While the paperwork processing if he ran out call us and if we have samples we can try to get she some. 4. Return for follow-up in one year or sooner if you need [place].

## 2015-06-02 NOTE — Assessment & Plan Note (Signed)
Symptoms essentially resolved when she was started back on Dexilant. She is not sure when she will be able to get her next supply because she is losing her insurance she divorced. We'll provide her with patient assistance paperwork to help cover the cost. She will call us if she runs out while we are processing the paperwork and we can try to get her samples to last in the meantime. Follow-up in one year or sooner if needed.

## 2015-07-19 ENCOUNTER — Telehealth: Payer: Self-pay

## 2015-07-19 MED ORDER — DEXLANSOPRAZOLE 60 MG PO CPDR: 60.0000 mg | DELAYED_RELEASE_CAPSULE | Freq: Every day | ORAL | Status: DC

## 2015-07-19 NOTE — Telephone Encounter (Signed)
Pt is calling to get a printed out Rx of Dexilant because she is going to get it from [place]. She said that she will need a year supply. She is as asking for some samples until then. Please advise

## 2015-07-19 NOTE — Telephone Encounter (Signed)
Printed Rx provided per patient request. Please notify patient it is ready for her to pick up.

## 2015-08-10 ENCOUNTER — Telehealth: Payer: Self-pay | Admitting: Internal Medicine

## 2015-08-10 NOTE — Telephone Encounter (Signed)
#  2 boxes are at the front.

## 2015-08-10 NOTE — Telephone Encounter (Signed)
Pt asked if she could get samples of dexilant because she is waiting for her Rx in [place]

## 2016-04-14 ENCOUNTER — Encounter: Payer: Self-pay | Admitting: Internal Medicine

## 2016-05-24 ENCOUNTER — Encounter: Payer: Self-pay | Admitting: Podiatry

## 2016-05-24 ENCOUNTER — Ambulatory Visit (INDEPENDENT_AMBULATORY_CARE_PROVIDER_SITE_OTHER): Payer: Medicare HMO | Admitting: Podiatry

## 2016-05-24 ENCOUNTER — Ambulatory Visit (INDEPENDENT_AMBULATORY_CARE_PROVIDER_SITE_OTHER): Payer: Medicare HMO

## 2016-05-24 VITALS — BP 132/81 | HR 74

## 2016-05-24 DIAGNOSIS — M779 Enthesopathy, unspecified: Secondary | ICD-10-CM

## 2016-05-24 DIAGNOSIS — M79672 Pain in left foot: Secondary | ICD-10-CM

## 2016-05-24 DIAGNOSIS — M7751 Other enthesopathy of right foot: Secondary | ICD-10-CM

## 2016-05-24 DIAGNOSIS — M79671 Pain in right foot: Secondary | ICD-10-CM | POA: Diagnosis not present

## 2016-05-24 DIAGNOSIS — M722 Plantar fascial fibromatosis: Secondary | ICD-10-CM

## 2016-05-24 DIAGNOSIS — M778 Other enthesopathies, not elsewhere classified: Secondary | ICD-10-CM

## 2016-05-24 NOTE — Progress Notes (Signed)
Patient ID: Marie Farmer, female   DOB: 03/27/1956, 60 y.o.   MRN: [id number]

## 2016-05-27 MED ORDER — BETAMETHASONE SOD PHOS & ACET 6 (3-3) MG/ML IJ SUSP: 12.0000 mg | Freq: Once | INTRAMUSCULAR | Status: DC

## 2016-05-27 NOTE — Progress Notes (Signed)
Patient ID: Marie Farmer, female   DOB: 1956/04/24, 60 y.o.   MRN: [id number] Subjective: Patient presents today to the office for evaluation of bilateral foot pain. Patient has a new complaint of pain to the right foot distally. Patient also does have some recurrent plantar fascial pain to the right foot she says. She was last seen in 2015 by Dr. Paulla Dolly. Today the patient states that she has pain in her locations of her feet which been going on for several weeks now.  Patient also presents with a few pair of old orthotics that she's had made in the past. None of which alleviate symptoms at the moment.  Objective: Physical Exam General: The patient is alert and oriented x3 in no acute distress.  Dermatology: Skin is warm, dry and supple bilateral lower extremities. Negative for open lesions or macerations bilateral.   Vascular: Dorsalis Pedis and Posterior Tibial pulses palpable bilateral.  Capillary fill time is immediate to all digits.  Neurological: Epicritic and protective threshold intact bilateral.   Musculoskeletal: Tenderness to palpation at the medial calcaneal tubercale and through the insertion of the plantar fascia of the right foot. All other joints range of motion within normal limits bilateral. Strength 5/5 in all groups bilateral.   Pain on palpation and range of motion to the second MPJ of the right foot. Generalized left foot pain as well most likely due to compensation during ambulation.  Radiographic exam:   Normal osseous mineralization. Joint spaces preserved. No fracture/dislocation/boney destruction. Calcaneal spur present with mild thickening of plantar fascia right.  No other soft tissue abnormalities or radiopaque foreign bodies.   Assessment: #1 capsulitis second MPJ right foot #2 plantar fasciitis right foot mid substance #3 pain in left foot-generalized  Problem List Items Addressed This Visit    None    Visit Diagnoses    Foot pain, right    -  Primary    Relevant Orders   DG Foot Complete Right   Foot pain, left       Relevant Orders   DG Foot Complete Left   Capsulitis of foot, right       2nd MPJ   Relevant Medications   betamethasone acetate-betamethasone sodium phosphate (CELESTONE) injection 12 mg (Start on 05/27/2016  8:15 AM)   Plantar fasciitis of right foot       Relevant Medications   betamethasone acetate-betamethasone sodium phosphate (CELESTONE) injection 12 mg (Start on 05/27/2016  8:15 AM)       Plan of Care:   1. Patient evaluated. Xrays reviewed.   2. Injection of 0.5cc Celestone soluspan injected into the right heel.  3. Injection of 0.5 mL Celestone Soluspan injected into the right plantar fascia mid substance.  4. Instructed patient regarding therapies and modalities at home to alleviate symptoms.  5. Recommend custom molded orthotics with second MPJ cutout offloading right foot and metatarsal pads bilateral 6. Return to clinic in 4 weeks.

## 2016-05-27 NOTE — Patient Instructions (Signed)
Plantar Fasciitis Plantar fasciitis is a painful foot condition that affects the heel. It occurs when the band of tissue that connects the toes to the heel bone (plantar fascia) becomes irritated. This can happen after exercising too much or doing other repetitive activities (overuse injury). The pain from plantar fasciitis can range from mild irritation to severe pain that makes it difficult for you to walk or move. The pain is usually worse in the morning or after you have been sitting or lying down for a while. CAUSES This condition may be caused by:  Standing for long periods of time.  Wearing shoes that do not fit.  Doing high-impact activities, including running, aerobics, and ballet.  Being overweight.  Having an abnormal way of walking (gait).  Having tight calf muscles.  Having high arches in your feet.  Starting a new athletic activity. SYMPTOMS The main symptom of this condition is heel pain. Other symptoms include:  Pain that gets worse after activity or exercise.  Pain that is worse in the morning or after resting.  Pain that goes away after you walk for a few minutes. DIAGNOSIS This condition may be diagnosed based on your signs and symptoms. Your health care provider will also do a physical exam to check for:  A tender area on the bottom of your foot.  A high arch in your foot.  Pain when you move your foot.  Difficulty moving your foot. You may also need to have imaging studies to confirm the diagnosis. These can include:  X-rays.  Ultrasound.  MRI. TREATMENT  Treatment for plantar fasciitis depends on the severity of the condition. Your treatment may include:  Rest, ice, and over-the-counter pain medicines to manage your pain.  Exercises to stretch your calves and your plantar fascia.  A splint that holds your foot in a stretched, upward position while you sleep (night splint).  Physical therapy to relieve symptoms and prevent problems in the  future.  Cortisone injections to relieve severe pain.  Extracorporeal shock wave therapy (ESWT) to stimulate damaged plantar fascia with electrical impulses. It is often used as a last resort before surgery.  Surgery, if other treatments have not worked after 12 months. HOME CARE INSTRUCTIONS  Take medicines only as directed by your health care provider.  Avoid activities that cause pain.  Roll the bottom of your foot over a bag of ice or a bottle of cold water. Do this for 20 minutes, 3-4 times a day.  Perform simple stretches as directed by your health care provider.  Try wearing athletic shoes with air-sole or gel-sole cushions or soft shoe inserts.  Wear a night splint while sleeping, if directed by your health care provider.  Keep all follow-up appointments with your health care provider. PREVENTION   Do not perform exercises or activities that cause heel pain.  Consider finding low-impact activities if you continue to have problems.  Lose weight if you need to. The best way to prevent plantar fasciitis is to avoid the activities that aggravate your plantar fascia. SEEK MEDICAL CARE IF:  Your symptoms do not go away after treatment with home care measures.  Your pain gets worse.  Your pain affects your ability to move or do your daily activities.   This information is not intended to replace advice given to you by your health care provider. Make sure you discuss any questions you have with your health care provider.   Document Released: 05/23/2001 Document Revised: 05/19/2015 Document Reviewed: 07/08/2014 Elsevier   Interactive Patient Education 2016 [employer].  

## 2016-06-26 ENCOUNTER — Other Ambulatory Visit: Payer: Self-pay

## 2016-06-27 MED ORDER — DEXLANSOPRAZOLE 60 MG PO CPDR: 60.0000 mg | DELAYED_RELEASE_CAPSULE | Freq: Every day | ORAL | 3 refills | Status: DC

## 2016-06-28 ENCOUNTER — Ambulatory Visit: Payer: Medicare HMO | Admitting: Podiatry

## 2016-07-12 ENCOUNTER — Encounter: Payer: Self-pay | Admitting: Podiatry

## 2016-07-12 ENCOUNTER — Ambulatory Visit (INDEPENDENT_AMBULATORY_CARE_PROVIDER_SITE_OTHER): Payer: Medicare HMO | Admitting: Podiatry

## 2016-07-12 DIAGNOSIS — M79671 Pain in right foot: Secondary | ICD-10-CM

## 2016-07-12 DIAGNOSIS — M7751 Other enthesopathy of right foot: Secondary | ICD-10-CM

## 2016-07-12 DIAGNOSIS — M722 Plantar fascial fibromatosis: Secondary | ICD-10-CM | POA: Diagnosis not present

## 2016-07-16 MED ORDER — BETAMETHASONE SOD PHOS & ACET 6 (3-3) MG/ML IJ SUSP: 3.0000 mg | Freq: Once | INTRAMUSCULAR | Status: DC

## 2016-07-16 NOTE — Progress Notes (Signed)
Subjective: Patient presents today for pain and tenderness in the right foot. Patient states the foot pain has been hurting for several weeks now. Patient states that it hurts in the mornings with the first steps out of bed. Patient presents today for further treatment and evaluation  Objective: Physical Exam General: The patient is alert and oriented x3 in no acute distress.  Dermatology: Skin is warm, dry and supple bilateral lower extremities. Negative for open lesions or macerations bilateral.   Vascular: Dorsalis Pedis and Posterior Tibial pulses palpable bilateral.  Capillary fill time is immediate to all digits.  Neurological: Epicritic and protective threshold intact bilateral.   Musculoskeletal: Tenderness to palpation at the medial calcaneal tubercale and through the insertion of the plantar fascia of the right foot. All other joints range of motion within normal limits bilateral. Strength 5/5 in all groups bilateral.   Radiographic exam: Normal osseous mineralization. Joint spaces preserved. No fracture/dislocation/boney destruction. Calcaneal spur present with mild thickening of plantar fascia right. No other soft tissue abnormalities or radiopaque foreign bodies.   Assessment: 1. Plantar fasciitis right 2. Pain in right foot 3. Capsulitis second MPJ right foot  Plan of Care:  1. Patient evaluated. Xrays reviewed.   2. Injection of 0.5cc Celestone soluspan injected into the right heel at the insertion of the plantar fascia.  3. Instructed patient regarding therapies and modalities at home to alleviate symptoms.  4. Recommend toe splint for second digit right foot 5. Today the patient was scanned for custom molded orthotics  6. Return to clinic in 4 weeks for orthotic pickup.

## 2016-07-24 ENCOUNTER — Ambulatory Visit: Payer: Medicare HMO | Admitting: Nurse Practitioner

## 2016-07-24 ENCOUNTER — Telehealth: Payer: Self-pay | Admitting: Nurse Practitioner

## 2016-07-24 ENCOUNTER — Encounter: Payer: Self-pay | Admitting: Nurse Practitioner

## 2016-07-24 NOTE — Progress Notes (Deleted)
Referring Provider: Deloria Lair., MD Primary Care Physician:  Deloria Lair, MD Primary GI:  Dr. Gala Romney  No chief complaint on file.   HPI:   Marie Farmer is a 60 y.o. female who presents for follow-up. The patient was last seen in our office 06/02/2015 for GERD. At that time the patient noted her symptoms are better controlled on Dexilant. She was unsure when should be able to get her next apply because she is scheduled to lose her insurance due to divorce. She was provided with patient assistance paperwork to help with cost concerns. Recommended 1 year follow-up  Today she states   Past Medical History:  Diagnosis Date  . Anxiety   . Asthma   . DDD (degenerative disc disease)    chronic back pain  . Fibromyalgia   . Foot pain   . GERD (gastroesophageal reflux disease)   . Hypertension   . IBS (irritable bowel syndrome)   . Mood disorder ([place])   . Venous insufficiency   . Wears glasses     Past Surgical History:  Procedure Laterality Date  . CARPAL TUNNEL RELEASE Right 06/10/2013   Procedure: CARPAL TUNNEL RELEASE;  Surgeon: Wynonia Sours, MD;  Location: [facility];  Service: Orthopedics;  Laterality: Right;  . COLONOSCOPY  12/20/11   [name]-single diminutive sessile polyp in the sigmoid colon/small interenal hemorrhoids  . ESOPHAGOGASTRODUODENOSCOPY  04/10/2003   Normal esophagus/Antral erosions, as described above.  The remainder of the stomach appeared normal.  Normal first and second parts of the duodenum.  . ESOPHAGOGASTRODUODENOSCOPY  12/20/11   [name]-hiatus hernia/chronic gastritis/normal duodenumNEGATIVE H pylori, negative SB biopsy  . KNEE ARTHROPLASTY    . TIBIA FRACTURE SURGERY  2012   right    Current Outpatient Prescriptions  Medication Sig Dispense Refill  . ALPRAZolam (XANAX) 1 MG tablet Take by mouth.    . benazepril-hydrochlorthiazide (LOTENSIN HCT) 20-25 MG per tablet Take by mouth.    . Biotin 300 MCG TABS Take by  mouth. womens hair and nail    . celecoxib (CELEBREX) 200 MG capsule Take 1 capsule (200 mg total) by mouth daily. 30 capsule 2  . dexlansoprazole (DEXILANT) 60 MG capsule Take 1 capsule (60 mg total) by mouth daily. 90 capsule 3  . FeFum-FePoly-FA-B Cmp-C-Biot (INTEGRA PLUS PO) Take by mouth.    . gabapentin (NEURONTIN) 300 MG capsule     . ibuprofen (ADVIL,MOTRIN) 200 MG tablet Take 200 mg by mouth every 6 (six) hours as needed.    . lansoprazole (PREVACID) 15 MG capsule Take 15 mg by mouth 2 (two) times daily before a meal.    . pantoprazole (PROTONIX) 40 MG tablet TAKE 1 TABLET BY MOUTH EVERY MORNING ON EMPTY STOMACH WAIT 45 MIN BEFORE EATING  1  . ranitidine (ZANTAC) 300 MG tablet TAKE 1 TABLET BY MOUTH DAILY AT BEDTIME FOR ACID REFLUX  1  . sucralfate (CARAFATE) 1 GM/10ML suspension Take 10 mLs (1 g total) by mouth 4 (four) times daily -  before meals and at bedtime. 420 mL 3  . UNABLE TO FIND 1 capsule daily. Med Name: gnc womens menopause formula    . valACYclovir (VALTREX) 1000 MG tablet Take 1,000 mg by mouth 2 (two) times daily.    Marland Kitchen zolpidem (AMBIEN CR) 12.5 MG CR tablet Take by mouth.     Current Facility-Administered Medications  Medication Dose Route Frequency Provider Last Rate Last Dose  . betamethasone acetate-betamethasone sodium phosphate (CELESTONE) injection 12  mg  12 mg Intramuscular Once Edrick Kins, DPM      . betamethasone acetate-betamethasone sodium phosphate (CELESTONE) injection 3 mg  3 mg Intramuscular Once Edrick Kins, DPM        Allergies as of 07/24/2016 - Review Complete 07/12/2016  Allergen Reaction Noted  . Adhesive [tape]  05/12/2011  . Cymbalta [duloxetine hcl]  06/04/2013  . Duloxetine    . Latex  05/12/2011  . Nabumetone    . Azithromycin Rash 02/29/2012    Family History  Problem Relation Age of Onset  . Diabetes Mother   . Coronary artery disease Mother   . COPD Mother   . Lung cancer Father     Social History   Social History  .  Marital status: Married    Spouse name: N/A  . Number of children: 2  . Years of education: N/A   Occupational History  . unemployed    Social History Main Topics  . Smoking status: Never Smoker  . Smokeless tobacco: Never Used  . Alcohol use Yes     Comment: socially, couple times per week  . Drug use: No     Comment: remote maijuana  . Sexual activity: No   Other Topics Concern  . Not on file   Social History Narrative   Lives w/ sister & her husband & daughter    Review of Systems: General: Negative for anorexia, weight loss, fever, chills, fatigue, weakness. Eyes: Negative for vision changes.  ENT: Negative for hoarseness, difficulty swallowing , nasal congestion. CV: Negative for chest pain, angina, palpitations, dyspnea on exertion, peripheral edema.  Respiratory: Negative for dyspnea at rest, dyspnea on exertion, cough, sputum, wheezing.  GI: See history of present illness. GU:  Negative for dysuria, hematuria, urinary incontinence, urinary frequency, nocturnal urination.  MS: Negative for joint pain, low back pain.  Derm: Negative for rash or itching.  Neuro: Negative for weakness, abnormal sensation, seizure, frequent headaches, memory loss, confusion.  Psych: Negative for anxiety, depression, suicidal ideation, hallucinations.  Endo: Negative for unusual weight change.  Heme: Negative for bruising or bleeding. Allergy: Negative for rash or hives.   Physical Exam: There were no vitals taken for this visit. General:   Alert and oriented. Pleasant and cooperative. Well-nourished and well-developed.  Head:  Normocephalic and atraumatic. Eyes:  Without icterus, sclera clear and conjunctiva pink.  Ears:  Normal auditory acuity. Mouth:  No deformity or lesions, oral mucosa pink.  Throat/Neck:  Supple, without mass or thyromegaly. Cardiovascular:  S1, S2 present without murmurs appreciated. Normal pulses noted. Extremities without clubbing or edema. Respiratory:   Clear to auscultation bilaterally. No wheezes, rales, or rhonchi. No distress.  Gastrointestinal:  +BS, soft, non-tender and non-distended. No HSM noted. No guarding or rebound. No masses appreciated.  Rectal:  Deferred  Musculoskalatal:  Symmetrical without gross deformities. Normal posture. Skin:  Intact without significant lesions or rashes. Neurologic:  Alert and oriented x4;  grossly normal neurologically. Psych:  Alert and cooperative. Normal mood and affect. Heme/Lymph/Immune: No significant cervical adenopathy. No excessive bruising noted.    07/24/2016 8:10 AM   Disclaimer: This note was dictated with voice recognition software. Similar sounding words can inadvertently be transcribed and may not be corrected upon review.

## 2016-07-24 NOTE — Telephone Encounter (Signed)
PT WAS A NO SHOW AND LETTER SENT  °

## 2016-07-24 NOTE — Telephone Encounter (Signed)
Noted  

## 2016-07-28 ENCOUNTER — Encounter: Payer: Self-pay | Admitting: Gastroenterology

## 2016-07-28 ENCOUNTER — Ambulatory Visit (INDEPENDENT_AMBULATORY_CARE_PROVIDER_SITE_OTHER): Payer: Medicare HMO | Admitting: Gastroenterology

## 2016-07-28 VITALS — BP 132/82 | HR 61 | Temp 97.2°F | Ht 63.0 in | Wt 217.0 lb

## 2016-07-28 DIAGNOSIS — K219 Gastro-esophageal reflux disease without esophagitis: Secondary | ICD-10-CM

## 2016-07-28 MED ORDER — DEXLANSOPRAZOLE 60 MG PO CPDR: 60.0000 mg | DELAYED_RELEASE_CAPSULE | Freq: Every day | ORAL | 3 refills | Status: DC

## 2016-07-28 NOTE — Progress Notes (Signed)
Primary Care Physician: Deloria Lair, MD  Primary Gastroenterologist:  Garfield Cornea, MD   Chief Complaint  Patient presents with  . Gastroesophageal Reflux    HPI: Marie Farmer is a 60 y.o. female here for follow up of GERD. She is on Dexilant with good control of her GERD. She gets 3 month supply from [place] for $245. She denies abd pain, vomiting, dysphagia, heartburn. BM regular. No melena, brbpr. Patient's last colonoscopy 12/2011 with single polyp. Advised to have follow up colonoscopy in 5 years.   Mother colon polyps in late 56s   Current Outpatient Prescriptions  Medication Sig Dispense Refill  . ALPRAZolam (XANAX) 1 MG tablet Take by mouth.    . benazepril-hydrochlorthiazide (LOTENSIN HCT) 20-25 MG per tablet Take by mouth.    . Biotin 300 MCG TABS Take by mouth. womens hair and nail    . celecoxib (CELEBREX) 200 MG capsule Take 1 capsule (200 mg total) by mouth daily. 30 capsule 2  . dexlansoprazole (DEXILANT) 60 MG capsule Take 1 capsule (60 mg total) by mouth daily. 90 capsule 3  . gabapentin (NEURONTIN) 300 MG capsule     . UNABLE TO FIND 1 capsule daily. Med Name: gnc womens menopause formula    . valACYclovir (VALTREX) 1000 MG tablet Take 1,000 mg by mouth 2 (two) times daily.     Current Facility-Administered Medications  Medication Dose Route Frequency Provider Last Rate Last Dose  . betamethasone acetate-betamethasone sodium phosphate (CELESTONE) injection 12 mg  12 mg Intramuscular Once Edrick Kins, DPM      . betamethasone acetate-betamethasone sodium phosphate (CELESTONE) injection 3 mg  3 mg Intramuscular Once Edrick Kins, DPM        Allergies as of 07/28/2016 - Review Complete 07/28/2016  Allergen Reaction Noted  . Adhesive [tape]  05/12/2011  . Cymbalta [duloxetine hcl]  06/04/2013  . Duloxetine    . Latex  05/12/2011  . Nabumetone    . Azithromycin Rash 02/29/2012   Past Medical History:  Diagnosis Date  . Anxiety   . Asthma   .  DDD (degenerative disc disease)    chronic back pain  . Fibromyalgia   . Foot pain   . GERD (gastroesophageal reflux disease)   . Hypertension   . IBS (irritable bowel syndrome)   . Mood disorder ([place])   . Venous insufficiency   . Wears glasses    Past Surgical History:  Procedure Laterality Date  . CARPAL TUNNEL RELEASE Right 06/10/2013   Procedure: CARPAL TUNNEL RELEASE;  Surgeon: Wynonia Sours, MD;  Location: [facility];  Service: Orthopedics;  Laterality: Right;  . COLONOSCOPY  12/20/11   [name]-single diminutive sessile polyp in the sigmoid colon/small interenal hemorrhoids  . ESOPHAGOGASTRODUODENOSCOPY  04/10/2003   Normal esophagus/Antral erosions, as described above.  The remainder of the stomach appeared normal.  Normal first and second parts of the duodenum.  . ESOPHAGOGASTRODUODENOSCOPY  12/20/11   [name]-hiatus hernia/chronic gastritis/normal duodenumNEGATIVE H pylori, negative SB biopsy  . KNEE ARTHROPLASTY    . TIBIA FRACTURE SURGERY  2012   right   Family History  Problem Relation Age of Onset  . Diabetes Mother   . Coronary artery disease Mother   . COPD Mother   . Lung cancer Father    Social History   Social History  . Marital status: Married    Spouse name: N/A  . Number of children: 2  . Years of education: N/A  Occupational History  . unemployed    Social History Main Topics  . Smoking status: Never Smoker  . Smokeless tobacco: Never Used  . Alcohol use Yes     Comment: socially, couple times per week  . Drug use: No     Comment: remote maijuana  . Sexual activity: No   Other Topics Concern  . None   Social History Narrative   Lives w/ sister & her husband & daughter    ROS:  General: Negative for anorexia, weight loss, fever, chills, fatigue, weakness. ENT: Negative for hoarseness, difficulty swallowing , nasal congestion. CV: Negative for chest pain, angina, palpitations, dyspnea on exertion, peripheral  edema.  Respiratory: Negative for dyspnea at rest, dyspnea on exertion, cough, sputum, wheezing.  GI: See history of present illness. GU:  Negative for dysuria, hematuria, urinary incontinence, urinary frequency, nocturnal urination.  Endo: Negative for unusual weight change.    Physical Examination:   BP 132/82   Pulse 61   Temp 97.2 F (36.2 C) (Oral)   Ht 5\' 3"  (1.6 m)   Wt 217 lb (98.4 kg)   BMI 38.44 kg/m   General: Well-nourished, well-developed in no acute distress.  Eyes: No icterus. Mouth: Oropharyngeal mucosa moist and pink , no lesions erythema or exudate. Lungs: Clear to auscultation bilaterally.  Heart: Regular rate and rhythm, no murmurs rubs or gallops.  Abdomen: Bowel sounds are normal, nontender, nondistended, no hepatosplenomegaly or masses, no abdominal bruits or hernia , no rebound or guarding.   Extremities: No lower extremity edema. No clubbing or deformities. Neuro: Alert and oriented x 4   Skin: Warm and dry, no jaundice.   Psych: Alert and cooperative, normal mood and affect.    Imaging Studies: No results found.

## 2016-07-28 NOTE — Patient Instructions (Signed)
1. Continue Dexilant once daily.  2. Return to the office in 18 months or sooner if needed.

## 2016-07-30 ENCOUNTER — Encounter: Payer: Self-pay | Admitting: Gastroenterology

## 2016-07-30 NOTE — Assessment & Plan Note (Signed)
Doing well. Continue Dexilant as before. Continue antireflux measures. Return to the office in 18 months.   After patient left the office, reviewed her prior TCS report done in [place] five years ago. She had single colon polyp. Advised to have next TCS five years by that provider. FH colon polyps as well. Would advise patient to update colonoscopy at any time. We will let her know.

## 2016-07-30 NOTE — Progress Notes (Signed)
Please let patient know, I reviewed her last tcs. Recommended five year follow up TCS by Dr. Daphene Calamity. It has been five years at this point. She can undergo TCS for surveillance at any time.

## 2016-07-31 ENCOUNTER — Ambulatory Visit (INDEPENDENT_AMBULATORY_CARE_PROVIDER_SITE_OTHER): Payer: Medicare HMO | Admitting: Podiatry

## 2016-07-31 DIAGNOSIS — M722 Plantar fascial fibromatosis: Secondary | ICD-10-CM

## 2016-07-31 NOTE — Progress Notes (Signed)
Patient ID: Marie Farmer, female   DOB: Nov 15, 1955, 60 y.o.   MRN: [id number]  Patient presents for orthotic pick up.  Verbal and written break in and wear instructions given.  Patient will follow up in 4 weeks if symptoms worsen or fail to improve.

## 2016-07-31 NOTE — Progress Notes (Signed)
cc'ed to pcp °

## 2016-07-31 NOTE — Patient Instructions (Signed)

## 2016-07-31 NOTE — Progress Notes (Signed)
Pt is aware and she will call back at the first of the year

## 2016-10-16 ENCOUNTER — Encounter: Payer: Self-pay | Admitting: Internal Medicine

## 2016-10-16 ENCOUNTER — Ambulatory Visit (INDEPENDENT_AMBULATORY_CARE_PROVIDER_SITE_OTHER): Payer: Medicare HMO | Admitting: Internal Medicine

## 2016-10-16 ENCOUNTER — Other Ambulatory Visit (INDEPENDENT_AMBULATORY_CARE_PROVIDER_SITE_OTHER): Payer: Medicare HMO

## 2016-10-16 VITALS — BP 120/80 | HR 100 | Ht 63.0 in | Wt 221.4 lb

## 2016-10-16 DIAGNOSIS — I10 Essential (primary) hypertension: Secondary | ICD-10-CM | POA: Diagnosis not present

## 2016-10-16 DIAGNOSIS — R0609 Other forms of dyspnea: Secondary | ICD-10-CM

## 2016-10-16 DIAGNOSIS — R0600 Dyspnea, unspecified: Secondary | ICD-10-CM

## 2016-10-16 LAB — CBC WITH DIFFERENTIAL/PLATELET
BASOS ABS: 0.1 10*3/uL (ref 0.0–0.1)
Basophils Relative: 1 % (ref 0.0–3.0)
Eosinophils Absolute: 0.1 10*3/uL (ref 0.0–0.7)
Eosinophils Relative: 2.4 % (ref 0.0–5.0)
HCT: 37.8 % (ref 36.0–46.0)
Hemoglobin: 12.7 g/dL (ref 12.0–15.0)
LYMPHS ABS: 1.5 10*3/uL (ref 0.7–4.0)
Lymphocytes Relative: 27 % (ref 12.0–46.0)
MCHC: 33.7 g/dL (ref 30.0–36.0)
MCV: 85.3 fl (ref 78.0–100.0)
Monocytes Absolute: 0.3 10*3/uL (ref 0.1–1.0)
Monocytes Relative: 6 % (ref 3.0–12.0)
NEUTROS ABS: 3.6 10*3/uL (ref 1.4–7.7)
NEUTROS PCT: 63.6 % (ref 43.0–77.0)
PLATELETS: 298 10*3/uL (ref 150.0–400.0)
RBC: 4.43 Mil/uL (ref 3.87–5.11)
RDW: 13.3 % (ref 11.5–15.5)
WBC: 5.7 10*3/uL (ref 4.0–10.5)

## 2016-10-16 LAB — BASIC METABOLIC PANEL
BUN: 26 mg/dL — AB (ref 6–23)
CALCIUM: 9.5 mg/dL (ref 8.4–10.5)
CO2: 30 mEq/L (ref 19–32)
Chloride: 105 mEq/L (ref 96–112)
Creatinine, Ser: 0.84 mg/dL (ref 0.40–1.20)
GFR: 73.84 mL/min (ref >60.00)
GLUCOSE: 93 mg/dL (ref 70–99)
Potassium: 3.8 mEq/L (ref 3.5–5.1)
Sodium: 141 mEq/L (ref 135–145)

## 2016-10-16 LAB — TSH: TSH: 3.31 u[IU]/mL (ref 0.35–4.50)

## 2016-10-16 LAB — BRAIN NATRIURETIC PEPTIDE: Pro B Natriuretic peptide (BNP): 16 pg/mL (ref 0.0–100.0)

## 2016-10-16 NOTE — Patient Instructions (Addendum)
Please remember to go to the lab  department downstairs in the basement  for your tests - we will call you with the results when they are available.  Continue dexilant Take 30-60 min before first meal of the day   GERD (REFLUX)  is an extremely common cause of respiratory symptoms just like yours , many times with no obvious heartburn at all.    It can be treated with medication, but also with lifestyle changes including elevation of the head of your bed (ideally with 6 inch  bed blocks),  Smoking cessation, avoidance of late meals, excessive alcohol, and avoid fatty foods, chocolate, peppermint, colas, red wine, and acidic juices such as orange juice.  NO MINT OR MENTHOL PRODUCTS SO NO COUGH DROPS  USE SUGARLESS CANDY INSTEAD (Jolley ranchers or Stover's or Life Savers) or even ice chips will also do - the key is to swallow to prevent all throat clearing. NO OIL BASED VITAMINS - use powdered substitutes.    Stop lotensin and start diovan (valsartan) 160-25 one daily and recheck pressure w/in 2 weeks - if not strong enough can take twice daily until we sort out your breathing problem   If you are satisfied with your treatment plan,  let your doctor know and he/she can either refill your medications or you can return here when your prescription runs out.     If in any way you are not 100% satisfied,  please tell us.  If 100% better, tell your friends!  Pulmonary follow up is as needed

## 2016-10-16 NOTE — Assessment & Plan Note (Addendum)
10/16/2016  Walked RA x 3 laps @ 185 ft each stopped due to  End of study, fast pace, no sob or desat    - Spirometry 10/16/2016  wnl except for truncation on effort dep portion of f/v loop only    Symptoms are markedly disproportionate to objective findings and not clear this is a lung problem but pt does appear to have difficult airway management issues. DDX of  difficult airways management almost all start with A and  include Adherence, Ace Inhibitors, Acid Reflux, Active Sinus Disease, Alpha 1 Antitripsin deficiency, Anxiety masquerading as Airways dz,  ABPA,  Allergy(esp in young), Aspiration (esp in elderly), Adverse effects of meds,  Active smokers, A bunch of PE's (a small clot burden can't cause this syndrome unless there is already severe underlying pulm or vascular dz with poor reserve) plus two Bs  = Bronchiectasis and Beta blocker use..and one C= CHF  Adherence is always the initial "prime suspect" and is a multilayered concern that requires a "trust but verify" approach in every patient - starting with knowing how to use medications, especially inhalers, correctly, keeping up with refills and understanding the fundamental difference between maintenance and prns vs those medications only taken for a very short course and then stopped and not refilled.   ACEi adverse effects at the  top of the usual list of suspects and the only way to rule it out is a trial off (see separate a/p)   ? Acid (or non-acid) GERD > always difficult to exclude as up to 75% of pts in some series report no assoc GI/ Heartburn symptoms and she already has documented HH/ GERD > rec continue  max dexilant for  acid suppression and diet restrictions/ reviewed and instructions given in writing.  ? Allergy > doubt but will do profile to be complete   ? A bunch of PE's > check d dimer  ? chf >  Excluded with BNP << 100   Total time devoted to counseling  > 50 % of initial 60 min office visit:  review case with pt/  discussion of options/alternatives/ personally creating written customized instructions  in presence of pt  then going over those specific  Instructions directly with the pt including how to use all of the meds but in particular covering each new medication in detail and the difference between the maintenance= "automatic" meds and the prns using an action plan format for the latter (If this problem/symptom => do that organization reading Left to right).  Please see AVS from this visit for a full list of these instructions which I personally wrote for this pt and  are unique to this visit.

## 2016-10-16 NOTE — Assessment & Plan Note (Signed)
In the best review of chronic cough to date ( NEJM 2016 375 [phone]) ,  ACEi are now felt to cause cough in up to  20% of pts which is a 4 fold increase from previous reports and does not include the variety of non-specific complaints we see in pulmonary clinic in pts on ACEi but previously attributed to another dx like  Copd/asthma and  include PNDS, throat and chest congestion, "bronchitis", unexplained dyspnea and noct "strangling" sensations, and hoarseness, but also  atypical /refractory GERD symptoms like dysphagia and "bad heartburn"   The only way I know  to prove this is not an "ACEi Case" is a trial off ACEi x a minimum of 6 weeks then regroup.   Try diovan 160-25 one daily  Follow up per Primary Care planned

## 2016-10-16 NOTE — Progress Notes (Signed)
Subjective:     Patient ID: Marie Farmer, female   DOB: 1956-09-08,    MRN: [id number]  HPI  61 yowf never smoker first felt doe varied since age 61 but two pregnancies s issues last in 1985 with baseline wt 165  And had a rough year in 1988 eval by steven and placed on shots and inhalers x 10 years but eventally stopped it all  able to walk until 2008 stopped her regular 3 mile walk at 220 due to feet and ankles then acutely had cough/ sob / chest discomfort with coughing Nov 2017 and all symptoms resolved x for sob so referred to pulmonary clinic 10/16/2016 by Dr   Scotty Court    10/16/2016 1st [facility] office visit/ Marie Farmer  Re unexplained doe  Chief Complaint  Patient presents with  . Pulmonary Consult    Referred by Dr. Matthias Hughs. Pt c/o SOB since Nov 2017. She states that she gets SOB when she walks "a very short distance", when tying her shoes or carrying something. She has noticed occ swelling in her legs.   doe x one aisle at [facility] ever since "bronchitis bug" in Nov 2017 despite all better from the bug except the change in baseline ex tolerance due to sob - not present sitting or sleeping   No obvious day to day or daytime variability or assoc excess/ purulent sputum or mucus plugs or hemoptysis or cp or chest tightness, subjective wheeze or overt sinus or hb symptoms. No unusual exp hx or h/o childhood pna/ asthma or knowledge of premature birth.  Sleeping ok without nocturnal  or early am exacerbation  of respiratory  c/o's or need for noct saba. Also denies any obvious fluctuation of symptoms with weather or environmental changes or other aggravating or alleviating factors except as outlined above   Current Medications, Allergies, Complete Past Medical History, Past Surgical History, Family History, and Social History were reviewed in [employer] record.  ROS  The following are not active complaints unless bolded sore throat, dysphagia, dental problems, itching,  sneezing,  nasal congestion or excess/ purulent secretions, ear ache,   fever, chills, sweats, unintended wt loss, classically pleuritic or exertional cp,  orthopnea pnd or leg swelling, presyncope, palpitations, abdominal pain, anorexia, nausea, vomiting, diarrhea  or change in bowel or bladder habits, change in stools or urine, dysuria,hematuria,  rash, arthralgias, visual complaints, headache, numbness, weakness or ataxia or problems with walking or coordination,  change in mood/affect or memory.          Review of Systems     Objective:   Physical Exam   amb wf nad  10/16/2016          221 = Body mass index is 39.22 kg/m.  Wt Readings from Last 3 Encounters:  07/28/16 217 lb (98.4 kg)  06/02/15 206 lb 3.2 oz (93.5 kg)  03/02/15 205 lb 3.2 oz (93.1 kg)    Vital signs reviewed  - Note on arrival 02 sats  97% on RA     HEENT: nl dentition, turbinates, and oropharynx. Nl external ear canals without cough reflex Modified Mallampati Score =   3/4   NECK :  without JVD/Nodes/TM/ nl carotid upstrokes bilaterally   LUNGS: no acc muscle use,  Nl contour chest which is clear to A and P bilaterally without cough on insp or exp maneuvers   CV:  RRR  no s3 or murmur or increase in P2, nad trace bilateral lower sym  ext pitting edema   ABD:  soft and nontender with nl inspiratory excursion in the supine position. No bruits or organomegaly appreciated, bowel sounds nl  MS:  Nl gait/ ext warm without deformities, calf tenderness, cyanosis or clubbing No obvious joint restrictions   SKIN: warm and dry without lesions    NEURO:  alert, approp, nl sensorium with  no motor or cerebellar deficits apparent.     CXR PA and Lateral:    09/19/16  I personally reviewed images and agree with radiology impression as follows:   No active cardiopulmonary disease/ moderate HH    Labs ordered/ reviewed:      Chemistry      Component Value Date/Time   NA 141 10/16/2016 1424   K 3.8 10/16/2016  1424   CL 105 10/16/2016 1424   CO2 30 10/16/2016 1424   BUN 26 (H) 10/16/2016 1424   CREATININE 0.84 10/16/2016 1424      Component Value Date/Time   CALCIUM 9.5 10/16/2016 1424        Lab Results  Component Value Date   WBC 5.7 10/16/2016   HGB 12.7 10/16/2016   HCT 37.8 10/16/2016   MCV 85.3 10/16/2016   PLT 298.0 10/16/2016         Lab Results  Component Value Date   TSH 3.31 10/16/2016     Lab Results  Component Value Date   PROBNP 16.0 10/16/2016               Assessment:

## 2016-10-16 NOTE — Assessment & Plan Note (Signed)
Body mass index is 39.22 kg/m.  trending up Lab Results  Component Value Date   TSH 3.31 10/16/2016     Contributing to gerd risk/ doe/reviewed the need and the process to achieve and maintain neg calorie balance > defer f/u primary care including intermittently monitoring thyroid status

## 2016-10-17 ENCOUNTER — Telehealth: Payer: Self-pay | Admitting: Internal Medicine

## 2016-10-17 LAB — RESPIRATORY ALLERGY PROFILE REGION II ~~LOC~~
Allergen, C. Herbarum, M2: <0.1 kU/L
Allergen, Cottonwood, t14: <0.1 kU/L
Allergen, D pternoyssinus,d7: 0.16 kU/L — ABNORMAL HIGH
Allergen, Mulberry, t76: <0.1 kU/L
Allergen, P. notatum, m1: <0.1 kU/L
Aspergillus fumigatus, m3: <0.1 kU/L
Bermuda Grass: <0.1 kU/L
Box Elder IgE: <0.1 kU/L
Cockroach: 0.61 kU/L — ABNORMAL HIGH
D. farinae: 0.13 kU/L — ABNORMAL HIGH
Dog Dander: <0.1 kU/L
Elm IgE: <0.1 kU/L
IGE (IMMUNOGLOBULIN E), SERUM: 40 kU/L (ref <115)
Johnson Grass: <0.1 kU/L
Pecan/Hickory Tree IgE: <0.1 kU/L
Rough Pigweed  IgE: <0.1 kU/L
Timothy Grass: <0.1 kU/L

## 2016-10-17 LAB — D-DIMER, QUANTITATIVE (NOT AT ARMC): D DIMER QUANT: 0.69 ug{FEU}/mL — AB (ref <0.50)

## 2016-10-17 NOTE — Telephone Encounter (Signed)
Labs and ov note faxed to Dr Rayna Sexton office at [phone]  Spoke with the pt and notified that this was done

## 2016-10-17 NOTE — Progress Notes (Signed)
Spoke with pt and notified of results per Dr. Wert. Pt verbalized understanding and denied any questions. 

## 2016-10-19 ENCOUNTER — Telehealth: Payer: Self-pay | Admitting: Internal Medicine

## 2016-10-19 MED ORDER — VALSARTAN-HYDROCHLOROTHIAZIDE 160-25 MG PO TABS: 1.0000 | ORAL_TABLET | Freq: Every day | ORAL | 2 refills | Status: DC

## 2016-10-19 NOTE — Telephone Encounter (Signed)
Spoke with the pt  Valsartan was never sent to her pharm on last ov  I apologized for the mistake and have sent rx  Nothing further needed

## 2016-10-31 ENCOUNTER — Telehealth: Payer: Self-pay | Admitting: Internal Medicine

## 2016-10-31 NOTE — Telephone Encounter (Signed)
Pt is asking for samples of Dexilant. [id number]

## 2016-10-31 NOTE — Telephone Encounter (Signed)
We are out of dexilant samples.

## 2016-11-02 ENCOUNTER — Telehealth: Payer: Self-pay | Admitting: Internal Medicine

## 2016-11-02 NOTE — Telephone Encounter (Signed)
Pt is wanting samples of Dexilant. She had called earlier in the week to check. I told her we were out but the rep is suppose to come next Tuesday with more samples. Can we save her a few?

## 2016-11-07 NOTE — Telephone Encounter (Signed)
We cannot save samples. Pt will have to call back and see if we have any available. We are still out of samples at this time.

## 2016-11-08 ENCOUNTER — Telehealth: Payer: Self-pay | Admitting: Internal Medicine

## 2016-11-08 NOTE — Telephone Encounter (Signed)
Pt calling to see if we have any dexilant samples.

## 2016-11-09 NOTE — Telephone Encounter (Signed)
We still dont have any available. Does she need patient assistance?

## 2017-03-26 ENCOUNTER — Telehealth: Payer: Self-pay | Admitting: Internal Medicine

## 2017-03-26 NOTE — Telephone Encounter (Signed)
I left Vm that I need more info on the pharmacy in [place].

## 2017-03-26 NOTE — Telephone Encounter (Signed)
[phone]  Patient called and stated she needs her dexilant sent into the pharmacy she uses out of [place], she is out of refills and is not yet due for an office follow up

## 2017-03-26 NOTE — Telephone Encounter (Signed)
Pt called and the phone number for the pharmacy is [phone]

## 2017-03-27 NOTE — Telephone Encounter (Signed)
I could not pull up the pharmacy with the phone number. I called pt and she is in [place], but when she gets home she will call with more information such as the name of the pharmacy and the fax number.

## 2017-03-28 ENCOUNTER — Telehealth: Payer: Self-pay | Admitting: Internal Medicine

## 2017-03-28 NOTE — Telephone Encounter (Signed)
[email or web address] [phone]  Patient called with information on Marie Farmer

## 2017-03-29 MED ORDER — DEXLANSOPRAZOLE 60 MG PO CPDR: 60.0000 mg | DELAYED_RELEASE_CAPSULE | Freq: Every day | ORAL | 3 refills | Status: DC

## 2017-03-29 NOTE — Telephone Encounter (Signed)
Fax Number for [facility] is [phone]  Rx will be faxed

## 2017-03-29 NOTE — Telephone Encounter (Signed)
Info added to original note.

## 2017-03-29 NOTE — Telephone Encounter (Signed)
See phone note of 03/28/2017. [email or web address] Fax  [phone]

## 2017-03-29 NOTE — Telephone Encounter (Signed)
rx done

## 2017-03-29 NOTE — Addendum Note (Signed)
Addended by: Mahala Menghini on: 03/29/2017 11:34 AM   Modules accepted: Orders

## 2017-03-29 NOTE — Telephone Encounter (Signed)
Forwarding to refill box.   I could not pull up the pharmacy to enter.  If you will print the Rx I will fax it.

## 2017-03-29 NOTE — Telephone Encounter (Signed)
Patient made aware.

## 2017-05-07 ENCOUNTER — Ambulatory Visit (INDEPENDENT_AMBULATORY_CARE_PROVIDER_SITE_OTHER): Payer: Medicare HMO | Admitting: Podiatry

## 2017-05-07 ENCOUNTER — Encounter: Payer: Self-pay | Admitting: Podiatry

## 2017-05-07 DIAGNOSIS — M722 Plantar fascial fibromatosis: Secondary | ICD-10-CM

## 2017-05-07 DIAGNOSIS — M79673 Pain in unspecified foot: Secondary | ICD-10-CM

## 2017-05-09 NOTE — Progress Notes (Signed)
Subjective: Marie Farmer presents the office today for concerns of a lateral heel, arch pain which is been ongoing for greater than 1 year. She last saw Dr. Amalia Hailey in November 2017 for similar issue on the right foot now she states any pain to both feet and use crutches in the same pain. Strength is a sharp pain involving healed also throbbing sensation and pain to the arch of her foot. She denies any recent injury or trauma. She denies any swelling or redness. The pain does not wake her up at night. She has pain in the mornings but she first gets up after periods of rest is becoming more consistent. She did not have any significant treatment since last appointment with Dr. Amalia Hailey. She has no other concerns today. Denies any systemic complaints such as fevers, chills, nausea, vomiting. No acute changes since last appointment, and no other complaints at this time.   Objective: AAO x3, NAD DP/PT pulses palpable bilaterally, CRT less than 3 seconds There is tenderness to palpation along the plantar medial tubercle of the calcaneus at the insertion of plantar fascia on the left and right foot. There is mildpain along the course of the plantar fascia within the arch of the foot. Plantar fascia appears to be intact. There is no pain with lateral compression of the calcaneus or pain with vibratory sensation. There is no pain along the course or insertion of the achilles tendon. No other areas of tenderness to bilateral lower extremities. Upon evaluation of her orthotics is not appear to be contracted the arch foot appropriately. No open lesions or pre-ulcerative lesions.  No pain with calf compression, swelling, warmth, erythema  Assessment: Bilateral foot pain likely result of plantar fasciitis, arch pain  Plan: -All treatment options discussed with the patient including all alternatives, risks, complications.  -Declined x-rays today.  -Tenderness to palpation along the plantar medial tubercle of the  calcaneus at the insertion of plantar fascia on the left and right foot. There is no pain along the course of the plantar fascia within the arch of the foot. Plantar fascia appears to be intact. There is no pain with lateral compression of the calcaneus or pain with vibratory sensation. There is no pain along the course or insertion of the achilles tendon. No other areas of tenderness to bilateral lower extremities. Liliane Channel also evaluated the patient for modifications to her inserts the abdomen pad to help the arch pain. -Stretching, icing exercises discussed. -Night splint dispensed. I used the "Body armor" night splint to help lift the hallux to help stretch the plantar fascia.  -Stretching, icing daily. -Continue supportive shoes at all times and not to go barefoot. -Patient encouraged to call the office with any questions, concerns, change in symptoms.   Celesta Gentile, DPM

## 2017-05-24 ENCOUNTER — Ambulatory Visit: Payer: Medicare HMO | Admitting: Podiatry

## 2017-06-04 ENCOUNTER — Ambulatory Visit: Payer: Medicare HMO | Admitting: Podiatry

## 2017-09-17 ENCOUNTER — Ambulatory Visit: Payer: Medicare HMO | Admitting: Family Medicine

## 2017-10-15 ENCOUNTER — Ambulatory Visit (INDEPENDENT_AMBULATORY_CARE_PROVIDER_SITE_OTHER): Payer: Medicare HMO

## 2017-10-15 DIAGNOSIS — Z86010 Personal history of colonic polyps: Secondary | ICD-10-CM

## 2017-10-15 MED ORDER — PEG 3350-KCL-NA BICARB-NACL 420 G PO SOLR: 4000.0000 mL | ORAL | 0 refills | Status: DC

## 2017-10-15 NOTE — Patient Instructions (Signed)
Marie Farmer   1956-03-11 MRN: [id number]    Procedure Date: 11/28/17 Time to register: 9:30 Place to register: [facility] Procedure Time: 10:30 Scheduled provider: [facility] WITH [facility]-LYTE SPLIT PREP  Please notify us immediately if you are diabetic, take iron supplements, or if you are on Coumadin or any other blood thinners.     You will need to purchase 1 fleet enema and 1 box of Bisacodyl 26m tablets.   2 DAYS BEFORE PROCEDURE:  DATE: 11/26/17  DAY: Monday Begin clear liquid diet AFTER your lunch meal. NO SOLID FOODS after this point.  1 DAY BEFORE PROCEDURE:  DATE: 11/27/17   DAY: Tuesday Continue clear liquids the entire day - NO SOLID FOOD.    At 2:00 pm:  Take 2 Bisacodyl tablets.   At 4:00pm:  Start drinking your solution. Make sure you mix well per instructions on the bottle. Try to drink 1 (one) 8 ounce glass every 10-15 minutes until you have consumed HALF the jug. You should complete by 6:00pm.You must keep the left over solution refrigerated until completed next day.  Continue clear liquids. You must drink plenty of clear liquids to prevent dehyration and kidney failure. Nothing to eat or drink after midnight.  EXCEPTION: If you take medications for your heart, blood pressure or breathing, you may take these medications with a small amount of clear liquid.    DAY OF PROCEDURE:   DATE: 11/28/17   DAY: Wednesday    Five hours before your procedure time @ 5:30am:  Finish remaining amout of bowel prep, drinking 1 (one) 8 ounce glass every 10-15 minutes until complete. You have two hours to consume remaining prep.   Three hours before your procedure time _0 :30am:  Nothing by mouth.   At least one hour before going to the hospital:  Give yourself one Fleet enema. You may take your morning medications with sip of water unless we have instructed otherwise.      Please see below for Dietary Information.  CLEAR LIQUIDS  INCLUDE:  Water Jello (NOT red in color)   Ice Popsicles (NOT red in color)   Tea (sugar ok, no milk/cream) Powdered fruit flavored drinks  Coffee (sugar ok, no milk/cream) Gatorade/ Lemonade/ Kool-Aid  (NOT red in color)   Juice: apple, white grape, white cranberry Soft drinks  Clear bullion, consomme, broth (fat free beef/chicken/vegetable)  Carbonated beverages (any kind)  Strained chicken noodle soup Hard Candy   Remember: Clear liquids are liquids that will allow you to see your fingers on the other side of a clear glass. Be sure liquids are NOT red in color, and not cloudy, but CLEAR.  DO NOT EAT OR DRINK ANY OF THE FOLLOWING:  Dairy products of any kind   Cranberry juice Tomato juice / V8 juice   Grapefruit juice Orange juice     Red grape juice  Do not eat any solid foods, including such foods as: cereal, oatmeal, yogurt, fruits, vegetables, creamed soups, eggs, bread, crackers, pureed foods in a blender, etc.   HELPFUL HINTS FOR DRINKING PREP SOLUTION:   Make sure prep is extremely cold. Mix and refrigerate the the morning of the prep. You may also put in the freezer.   You may try mixing some Crystal Light or Country Time Lemonade if you prefer. Mix in small amounts; add more if necessary.  Try drinking through a straw  Rinse mouth with water or a mouthwash between glasses, to remove after-taste.  Try sipping on a cold beverage /ice/ popsicles between glasses of prep.  Place a piece of sugar-free hard candy in mouth between glasses.  If you become nauseated, try consuming smaller amounts, or stretch out the time between glasses. Stop for 30-60 minutes, then slowly start back drinking.        OTHER INSTRUCTIONS  You will need a responsible adult at least 60 years of age to accompany you and drive you home. This person must remain in the waiting room during your procedure. The hospital will cancel your procedure if you do not have a responsible adult with you.    1. Wear loose fitting clothing that is easily removed. 2. Leave jewelry and other valuables at home.  3. Remove all body piercing jewelry and leave at home. 4. Total time from sign-in until discharge is approximately 2-3 hours. 5. You should go home directly after your procedure and rest. You can resume normal activities the day after your procedure. 6. The day of your procedure you should not:  Drive  Make legal decisions  Operate machinery  Drink alcohol  Return to work   You may call the office (Dept: [phone]) before 5:00pm, or page the doctor on call [phone]) after 5:00pm, for further instructions, if necessary.   Insurance Information YOU WILL NEED TO CHECK WITH YOUR INSURANCE COMPANY FOR THE BENEFITS OF COVERAGE YOU HAVE FOR THIS PROCEDURE.  UNFORTUNATELY, NOT ALL INSURANCE COMPANIES HAVE BENEFITS TO COVER ALL OR PART OF THESE TYPES OF PROCEDURES.  IT IS YOUR RESPONSIBILITY TO CHECK YOUR BENEFITS, HOWEVER, WE WILL BE GLAD TO ASSIST YOU WITH ANY CODES YOUR INSURANCE COMPANY MAY NEED.    PLEASE NOTE THAT MOST INSURANCE COMPANIES WILL NOT COVER A SCREENING COLONOSCOPY FOR PEOPLE UNDER THE AGE OF 50  IF YOU HAVE BCBS INSURANCE, YOU MAY HAVE BENEFITS FOR A SCREENING COLONOSCOPY BUT IF POLYPS ARE FOUND THE DIAGNOSIS WILL CHANGE AND THEN YOU MAY HAVE A DEDUCTIBLE THAT WILL NEED TO BE MET. SO PLEASE MAKE SURE YOU CHECK YOUR BENEFITS FOR A SCREENING COLONOSCOPY AS WELL AS A DIAGNOSTIC COLONOSCOPY.

## 2017-10-15 NOTE — Progress Notes (Signed)
Gastroenterology Pre-Procedure Review  Request Date:10/15/17 Requesting Physician:   PATIENT REVIEW QUESTIONS: The patient responded to the following health history questions as indicated:    1. Diabetes Melitis: no 2. Joint replacements in the past 12 months: no 3. Major health problems in the past 3 months: no 4. Has an artificial valve or MVP: no 5. Has a defibrillator: no 6. Has been advised in past to take antibiotics in advance of a procedure like teeth cleaning: no 7. Family history of colon cancer: no  8. Alcohol Use: no 9. History of sleep apnea: yes (on cpap)  10. History of coronary artery or other vascular stents placed within the last 12 months: no 11. History of any prior anesthesia complications: no    MEDICATIONS & ALLERGIES:    Patient reports the following regarding taking any blood thinners:   Plavix? no Aspirin? yes (325mg ) Coumadin? no Brilinta? no Xarelto? no Eliquis? no Pradaxa? no Savaysa? no Effient? no  Patient confirms/reports the following medications:  Current Outpatient Medications  Medication Sig Dispense Refill  . Biotin 300 MCG TABS Take by mouth. womens hair and nail    . celecoxib (CELEBREX) 200 MG capsule Take 1 capsule (200 mg total) by mouth daily. 30 capsule 2  . dexlansoprazole (DEXILANT) 60 MG capsule Take 1 capsule (60 mg total) by mouth daily before breakfast. 90 capsule 3  . gabapentin (NEURONTIN) 300 MG capsule Take 300 mg by mouth 3 (three) times daily.     . valsartan-hydrochlorothiazide (DIOVAN HCT) 160-25 MG tablet Take 1 tablet by mouth daily. 30 tablet 2   Current Facility-Administered Medications  Medication Dose Route Frequency Provider Last Rate Last Dose  . betamethasone acetate-betamethasone sodium phosphate (CELESTONE) injection 12 mg  12 mg Intramuscular Once Daylene Katayama M, DPM      . betamethasone acetate-betamethasone sodium phosphate (CELESTONE) injection 3 mg  3 mg Intramuscular Once Edrick Kins, DPM         Patient confirms/reports the following allergies:  Allergies  Allergen Reactions  . Adhesive [Tape]     Peels skin  . Cymbalta [Duloxetine Hcl]     Hot mouth  . Duloxetine     REACTION: UNKNOWN REACTION  . Latex     Sensitive skin  . Nabumetone     REACTION: UNKNOWN REACTION  . Azithromycin Rash    No orders of the defined types were placed in this encounter.   AUTHORIZATION INFORMATION Primary InsuranceCephus Shelling ID #: [id number] Pre-Cert / Josem Kaufmann required no  SCHEDULE INFORMATION: Procedure has been scheduled as follows:  Date: 11/28/17, Time: 10:30 Location: [facility] Dr[facility]  This Gastroenterology Pre-Precedure Review Form is being routed to the following provider(s): Neil Crouch PA-C

## 2017-10-16 NOTE — Progress Notes (Signed)
Ok to schedule.

## 2017-10-23 ENCOUNTER — Ambulatory Visit (INDEPENDENT_AMBULATORY_CARE_PROVIDER_SITE_OTHER): Payer: Medicare HMO | Admitting: Physical Medicine and Rehabilitation

## 2017-10-23 ENCOUNTER — Ambulatory Visit (INDEPENDENT_AMBULATORY_CARE_PROVIDER_SITE_OTHER): Payer: Medicare HMO

## 2017-10-23 ENCOUNTER — Encounter (INDEPENDENT_AMBULATORY_CARE_PROVIDER_SITE_OTHER): Payer: Self-pay | Admitting: Physical Medicine and Rehabilitation

## 2017-10-23 VITALS — BP 141/80 | HR 69 | Temp 98.4°F

## 2017-10-23 DIAGNOSIS — M5416 Radiculopathy, lumbar region: Secondary | ICD-10-CM

## 2017-10-23 DIAGNOSIS — M5442 Lumbago with sciatica, left side: Secondary | ICD-10-CM

## 2017-10-23 DIAGNOSIS — M25552 Pain in left hip: Secondary | ICD-10-CM

## 2017-10-23 DIAGNOSIS — G8929 Other chronic pain: Secondary | ICD-10-CM

## 2017-10-23 NOTE — Progress Notes (Signed)
Marie Farmer - 62 y.o. female MRN [id number]  Date of birth: 12/19/1955  Office Visit Note: Visit Date: 10/23/2017 PCP: Patient, No Pcp Per Referred by: Deloria Lair., MD  Subjective: Chief Complaint  Patient presents with  . Lower Back - Pain  . Left Hip - Pain  . Left Leg - Pain   HPI: Marie Farmer is a 62 year old female who I have seen in the past and the last time I saw her was in 2000 and we completed L5-S1 facet joint block.  She is a patient of Dr. Rudene Anda.  She has not seen Dr. Durward Fortes recently.  She has been undergoing treatment by podiatrist for her feet and she feels like she was treated for her back pain since she has seen me and actually thought she had an injection 80 since the last time we have seen her but we were able to review our notes on our other electronic medical record and it was in 2017 when we saw her.  Nonetheless she comes in today complaining of throbbing pain in the lower back left buttock and left hip and anterior thigh with some pain across the groin region.  She is not reporting pain down past the knee or paresthesias.  She has no right-sided complaints.  In the past her pain had been more on the right side.  She reports no specific injury but has had onset of pain since 2018 in June and has been worsening ever since.  She has been seeing a chiropractor with some relief.  She had some massage treatment I believe.  She reports that standing really makes her symptoms worse and worse with weightbearing.  She does not really endorse pain getting in and out of the car.  She is unsure about stairs.  She does report that to a degree.  She has not had specific physical therapy other than the chiropractic care.  She has had no bowel or bladder dysfunction.  She has had no recent imaging but has an MRI from 2014 which was reviewed again today.  She does carry a diagnosis of fibromyalgia.  She reports no fevers chills or night sweats.  Terms of her history with  injections it does appear at least on our medical record that she may have received muscular betamethasone at some point but I am unsure if this was for her back pain or not.  Like at one point she might of seen Dr. Vanita Panda he is an orthopedic surgeon.  She is not had any surgery on either hip.  She has had a knee replacement.    Review of Systems  Constitutional: Negative for chills, fever, malaise/fatigue and weight loss.  HENT: Negative for hearing loss and sinus pain.   Eyes: Negative for blurred vision, double vision and photophobia.  Respiratory: Negative for cough and shortness of breath.   Cardiovascular: Negative for chest pain, palpitations and leg swelling.  Gastrointestinal: Negative for abdominal pain, nausea and vomiting.  Genitourinary: Negative for flank pain.  Musculoskeletal: Positive for back pain and joint pain. Negative for myalgias.  Skin: Negative for itching and rash.  Neurological: Negative for tremors, focal weakness and weakness.  Endo/Heme/Allergies: Negative.   Psychiatric/Behavioral: Negative for depression.  All other systems reviewed and are negative.  Otherwise per HPI.  Assessment & Plan: Visit Diagnoses:  1. Lumbar radiculopathy   2. Chronic left-sided low back pain with left-sided sciatica   3. Pain in left hip     Plan: Findings:  Chronic history of mechanical back pain with 2014 MRI with very minimal degenerative changes of the lumbar spine although she has fairly significant facet arthropathy on the right more than left at L5-S1 and L4-5.  There were no focal herniations at the time.  She had no focal stenosis.  X-ray imaging today was unrevealing except for somewhat of a rotated pelvis with mild degenerative changes of the left hip.  Her pain does seem to be consistent with mechanical complaints of either facet arthropathy or possibly a sacroiliac joint issue or possibly a hip issue on the left.  She is currently taking gabapentin I do think because  in the past it seemed to help quite a bit with the facet joint block wise to at least attempt that from a diagnostic standpoint.  Direct care to more of a lumbar spine issue does not seem to help direct care more to the sacroiliac joint.  To discuss this with her and she can communicate that to the chiropractor as well.  I do not think there is any updated MRI that needs to pelvis at some point if she does were not getting much relief.  I think the underlying fibromyalgia obviously comes in the playing in terms of pain sensations even with mild changes with arthritic joints or musculotendinous disorders.    Meds & Orders: No orders of the defined types were placed in this encounter.   Orders Placed This Encounter  Procedures  . XR HIP UNILAT W OR W/O PELVIS 1V LEFT  . XR Lumbar Spine 2-3 Views    Follow-up: Return for Left L5-S1 facet joint block.   Procedures: No procedures performed  No notes on file   Clinical History: MRI LUMBAR SPINE WITHOUT CONTRAST 09/27/2012   Technique: Multiplanar and multiecho pulse sequences of the lumbar spine were obtained without intravenous contrast.   Comparison: 05/19/2012   Findings: A transitional lumbosacral vertebra is assumed to represent the S1 level. If procedural intervention is to be performed, careful correlation with this numbering strategy is recommended. This is the numbering strategy used on the prior exam.   The conus medullaris appears unremarkable. Conus level: L1-2.   There is 3 mm of degenerative posterior subluxation of L5-S1.   Mild disc desiccation is noted at all levels between L2 and S1. Inversion recovery weighted images demonstrate no significant abnormal vertebral or periligamentous edema.   Small Schmorl's nodes are present along the endplates of L2. There is an inferior endplate Schmorl's node at T11.   A single right-sided T2 hyperintense, T1 hypointense renal lesion is noted; this is statistically likely to  represent cyst but is not fully evaluated on today's lumbar spine MRI.   No change in appearance of suspected atypical hemangioma in the L5 vertebral body.   Mild disc bulge suspected at T11-12, without impingement. Additional findings at individual levels are as follows:   L1-2: Unremarkable.   L2-3: No impingement. Mild disc bulge.   L3-4: No impingement. Mild disc bulge.   L4-5: No impingement. Mild disc bulge.   L5-S1: No impingement. Diffuse disc bulge and facet arthropathy.   S1-2: No impingement. The disc space at this level is somewhat rudimentary.   IMPRESSION:   1. Degenerative disc disease and lower lumbar spondylosis, without impingement identified.  She reports that  has never smoked. she has never used smokeless tobacco. No results for input(s): HGBA1C, LABURIC in the last 8760 hours.  Objective:  VS:  HT:    WT:   BMI:  BP:(!) 141/80  HR:69bpm  TEMP:98.4 F (36.9 C)(Oral)  RESP:98 % Physical Exam  Constitutional: She is oriented to person, place, and time. She appears well-developed and well-nourished. No distress.  HENT:  Head: Normocephalic and atraumatic.  Nose: Nose normal.  Mouth/Throat: Oropharynx is clear and moist.  Eyes: Conjunctivae are normal. Pupils are equal, round, and reactive to light.  Neck: Normal range of motion. Neck supple.  Cardiovascular: Regular rhythm and intact distal pulses.  Pulmonary/Chest: Effort normal. No respiratory distress.  Abdominal: She exhibits no distension. There is no guarding.  Musculoskeletal:  Patient is a little slow to go from sit to stand in full extension.  She does have tender points across the lower back and PSIS and greater trochanters.  She does have some restricted movement with internal rotation of the left hip compared to external.  She does not have frank groin pain however.  She does get some pain over the greater trochanter with hip movement.  She has good distal strength without any clonus.    Neurological: She is alert and oriented to person, place, and time. She exhibits normal muscle tone. Coordination normal.  Skin: Skin is warm. No rash noted. No erythema.  Psychiatric: She has a normal mood and affect. Her behavior is normal.  Nursing note and vitals reviewed.   Ortho Exam Imaging: Xr Hip Unilat W Or W/o Pelvis 1v Left  Result Date: 10/24/2017 Standing AP of the pelvis and oblique view of the left hip show rotation of the pelvis with left obliquity.  She has mild degenerative changes of the left hip compared to right.  There is still good joint spacing.  There is some very mild periarticular spurring.  There are no fractures or dislocations.  She has some degenerative changes of the superior part of the pubic symphysis.  Xr Lumbar Spine 2-3 Views  Result Date: 10/24/2017 AP and lateral lumbar spine x-ray shows fairly normal anatomic alignment minimal rotational scoliosis without listhesis.  She appears like she may have a transitional S1 segment.  There is right more than left facet arthropathy at L4-5 and L5-S1.  Disc heights are fairly well-maintained does have some rotation of the pelvis the iliac crest heights were unequal with the left side being a little bit lower than the right.  Hip height does seem to be equal however.  No sclerosis seen of the sacroiliac joints.   Past Medical/Family/Surgical/Social History: Medications & Allergies reviewed per EMR Patient Active Problem List   Diagnosis Date Noted  . Dyspnea on exertion 10/16/2016  . Essential hypertension 10/16/2016  . Morbid obesity due to excess calories ([place]) 10/16/2016  . IBS (irritable bowel syndrome) 02/29/2012  . Tibial plateau fracture 05/10/2011  . HELICOBACTER PYLORI INFECTION 08/25/2009  . DEPRESSION 08/25/2009  . ASTHMA 08/25/2009  . GASTROESOPHAGEAL REFLUX DISEASE, CHRONIC 08/25/2009  . ALLERGY 08/25/2009  . CHOLELITHIASIS, HX OF 08/25/2009   Past Medical History:  Diagnosis Date  .  Anxiety   . Asthma   . DDD (degenerative disc disease)    chronic back pain  . Fibromyalgia   . Foot pain   . GERD (gastroesophageal reflux disease)   . Hypertension   . IBS (irritable bowel syndrome)   . Mood disorder ([place])   . Venous insufficiency   . Wears glasses    Family History  Problem Relation Age of Onset  . Diabetes Mother   . Coronary artery disease Mother   . COPD Mother   . Colon polyps Mother  age 43  . Lung cancer Father    Past Surgical History:  Procedure Laterality Date  . CARPAL TUNNEL RELEASE Right 06/10/2013   Procedure: CARPAL TUNNEL RELEASE;  Surgeon: Wynonia Sours, MD;  Location: [facility];  Service: Orthopedics;  Laterality: Right;  . COLONOSCOPY  12/20/11   [name]-single diminutive sessile polyp in the sigmoid colon/small interenal hemorrhoids  . ESOPHAGOGASTRODUODENOSCOPY  04/10/2003   Normal esophagus/Antral erosions, as described above.  The remainder of the stomach appeared normal.  Normal first and second parts of the duodenum.  . ESOPHAGOGASTRODUODENOSCOPY  12/20/11   [name]-hiatus hernia/chronic gastritis/normal duodenumNEGATIVE H pylori, negative SB biopsy  . KNEE ARTHROPLASTY    . TIBIA FRACTURE SURGERY  2012   right   Social History   Occupational History  . Occupation: unemployed  Tobacco Use  . Smoking status: Never Smoker  . Smokeless tobacco: Never Used  Substance and Sexual Activity  . Alcohol use: Yes    Comment: socially, couple times per week  . Drug use: No    Comment: remote maijuana  . Sexual activity: No    Birth control/protection: None

## 2017-10-23 NOTE — Progress Notes (Deleted)
Pt states a throbbing pain in her lower back, left buttock, left hip, and left leg. Pt states since June 2018 pain has been at its worse. Pt states she has been going to the chiropractor since June 2018. Pt states standing makes pain worse, pain medication eases pain.

## 2017-10-24 ENCOUNTER — Encounter (INDEPENDENT_AMBULATORY_CARE_PROVIDER_SITE_OTHER): Payer: Self-pay | Admitting: Physical Medicine and Rehabilitation

## 2017-10-29 ENCOUNTER — Telehealth (INDEPENDENT_AMBULATORY_CARE_PROVIDER_SITE_OTHER): Payer: Self-pay | Admitting: Physical Medicine and Rehabilitation

## 2017-10-29 NOTE — Telephone Encounter (Signed)
o you know anything about this one? I do not see a referral in her chart. Looks like she was seen by Dr. Ernestina Patches as an offive visit on 10/23/17.

## 2017-10-29 NOTE — Telephone Encounter (Signed)
Patient called asking if her injection had been approved by her insurance yet? States she's in a lot of pain. CB # [phone]

## 2017-11-05 ENCOUNTER — Telehealth (INDEPENDENT_AMBULATORY_CARE_PROVIDER_SITE_OTHER): Payer: Self-pay

## 2017-11-05 NOTE — Telephone Encounter (Signed)
error 

## 2017-11-06 ENCOUNTER — Telehealth (INDEPENDENT_AMBULATORY_CARE_PROVIDER_SITE_OTHER): Payer: Self-pay | Admitting: *Deleted

## 2017-11-06 NOTE — Telephone Encounter (Signed)
Pt left vm on triage requesting a call back in reference to her insurance stating has not heard anything about getting approval for her shots. Please advise  CB# [phone]

## 2017-11-06 NOTE — Telephone Encounter (Signed)
Has this one been approved yet?

## 2017-11-08 ENCOUNTER — Encounter (INDEPENDENT_AMBULATORY_CARE_PROVIDER_SITE_OTHER): Payer: Self-pay | Admitting: Physical Medicine and Rehabilitation

## 2017-11-13 ENCOUNTER — Encounter (INDEPENDENT_AMBULATORY_CARE_PROVIDER_SITE_OTHER): Payer: Self-pay | Admitting: Physical Medicine and Rehabilitation

## 2017-11-13 ENCOUNTER — Ambulatory Visit (INDEPENDENT_AMBULATORY_CARE_PROVIDER_SITE_OTHER): Payer: Self-pay

## 2017-11-13 ENCOUNTER — Ambulatory Visit (INDEPENDENT_AMBULATORY_CARE_PROVIDER_SITE_OTHER): Payer: Medicare HMO | Admitting: Physical Medicine and Rehabilitation

## 2017-11-13 VITALS — BP 147/85 | HR 65 | Temp 98.3°F

## 2017-11-13 DIAGNOSIS — M545 Low back pain, unspecified: Secondary | ICD-10-CM

## 2017-11-13 DIAGNOSIS — M47816 Spondylosis without myelopathy or radiculopathy, lumbar region: Secondary | ICD-10-CM | POA: Diagnosis not present

## 2017-11-13 DIAGNOSIS — G8929 Other chronic pain: Secondary | ICD-10-CM | POA: Diagnosis not present

## 2017-11-13 MED ORDER — METHYLPREDNISOLONE ACETATE 80 MG/ML IJ SUSP
80.0000 mg | Freq: Once | INTRAMUSCULAR | Status: AC
  Administered 2017-11-13: 80 mg

## 2017-11-13 NOTE — Patient Instructions (Signed)

## 2017-11-13 NOTE — Progress Notes (Signed)
 .  Numeric Pain Rating Scale and Functional Assessment Average Pain 9   In the last MONTH (on 0-10 scale) has pain interfered with the following?  1. General activity like being  able to carry out your everyday physical activities such as walking, climbing stairs, carrying groceries, or moving a chair?  Rating(9)   +Driver, -BT, -Dye Allergies.  

## 2017-11-15 NOTE — Procedures (Signed)
Lumbar Facet Joint Intra-Articular Injection(s) with Fluoroscopic Guidance  Patient: Marie Farmer      Date of Birth: 04/30/1956 MRN: [id number] PCP: Patient, No Pcp Per      Visit Date: 11/13/2017   Universal Protocol:    Date/Time: 11/13/2017  Consent Given By: the patient  Position: PRONE   Additional Comments: Vital signs were monitored before and after the procedure. Patient was prepped and draped in the usual sterile fashion. The correct patient, procedure, and site was verified.   Injection Procedure Details:  Procedure Site One Meds Administered:  Meds ordered this encounter  Medications  . methylPREDNISolone acetate (DEPO-MEDROL) injection 80 mg     Laterality: Bilateral  Location/Site:  L5-S1  Needle size: 22 guage  Needle type: Spinal  Needle Placement: Articular  Findings:  -Comments: Excellent flow of contrast producing a partial arthrogram.  Procedure Details: The fluoroscope beam is vertically oriented in AP, and the inferior recess is visualized beneath the lower pole of the inferior apophyseal process, which represents the target point for needle insertion. When direct visualization is difficult the target point is located at the medial projection of the vertebral pedicle. The region overlying each aforementioned target is locally anesthetized with a 1 to 2 ml. volume of 1% Lidocaine without Epinephrine.   The spinal needle was inserted into each of the above mentioned facet joints using biplanar fluoroscopic guidance. A 0.25 to 0.5 ml. volume of Isovue-250 was injected and a partial facet joint arthrogram was obtained. A single spot film was obtained of the resulting arthrogram.    One to 1.25 ml of the steroid/anesthetic solution was then injected into each of the facet joints noted above.   Additional Comments:  The patient tolerated the procedure well No complications occurred Dressing: Band-Aid    Post-procedure details: Patient was  observed during the procedure. Post-procedure instructions were reviewed.  Patient left the clinic in stable condition.

## 2017-11-15 NOTE — Progress Notes (Signed)
Marie Farmer - 62 y.o. female MRN [id number]  Date of birth: 1956-03-31  Office Visit Note: Visit Date: 11/13/2017 PCP: Patient, No Pcp Per Referred by: Deloria Lair., MD  Subjective: Chief Complaint  Patient presents with  . Left Leg - Pain   HPI: Marie Farmer is a 62 year old female that I have seen in the past for left-sided L5-S1 facet joint block which was very beneficial for her back and hip pain.  We saw her recently for follow-up evaluation because is been a while since we have seen her and she was having consistent symptoms again worse with standing and extension that were consistent with this facet joint arthritis.  She comes in today with complaints really of both left and right side bothering her a little very consistent.  We did review the MRI again with her and she actually has more findings on the right than the left but both sides have significant arthritis.  She reports almost 30 years of back pain.  We will go ahead and complete diagnostic and therapeutic bilateral L5-S1 facet joint blocks.  Depending on relief we would look at medial branch blocks and radiofrequency ablation.    ROS Otherwise per HPI.  Assessment & Plan: Visit Diagnoses:  1. Spondylosis without myelopathy or radiculopathy, lumbar region   2. Chronic bilateral low back pain without sciatica     Plan: No additional findings.   Meds & Orders:  Meds ordered this encounter  Medications  . methylPREDNISolone acetate (DEPO-MEDROL) injection 80 mg    Orders Placed This Encounter  Procedures  . Facet Injection  . XR C-ARM NO REPORT    Follow-up: Return if symptoms worsen or fail to improve.   Procedures: No procedures performed  Lumbar Facet Joint Intra-Articular Injection(s) with Fluoroscopic Guidance  Patient: Marie Farmer      Date of Birth: 09/15/1955 MRN: [id number] PCP: Patient, No Pcp Per      Visit Date: 11/13/2017   Universal Protocol:    Date/Time: 11/13/2017  Consent Given  By: the patient  Position: PRONE   Additional Comments: Vital signs were monitored before and after the procedure. Patient was prepped and draped in the usual sterile fashion. The correct patient, procedure, and site was verified.   Injection Procedure Details:  Procedure Site One Meds Administered:  Meds ordered this encounter  Medications  . methylPREDNISolone acetate (DEPO-MEDROL) injection 80 mg     Laterality: Bilateral  Location/Site:  L5-S1  Needle size: 22 guage  Needle type: Spinal  Needle Placement: Articular  Findings:  -Comments: Excellent flow of contrast producing a partial arthrogram.  Procedure Details: The fluoroscope beam is vertically oriented in AP, and the inferior recess is visualized beneath the lower pole of the inferior apophyseal process, which represents the target point for needle insertion. When direct visualization is difficult the target point is located at the medial projection of the vertebral pedicle. The region overlying each aforementioned target is locally anesthetized with a 1 to 2 ml. volume of 1% Lidocaine without Epinephrine.   The spinal needle was inserted into each of the above mentioned facet joints using biplanar fluoroscopic guidance. A 0.25 to 0.5 ml. volume of Isovue-250 was injected and a partial facet joint arthrogram was obtained. A single spot film was obtained of the resulting arthrogram.    One to 1.25 ml of the steroid/anesthetic solution was then injected into each of the facet joints noted above.   Additional Comments:  The patient tolerated the procedure  well No complications occurred Dressing: Band-Aid    Post-procedure details: Patient was observed during the procedure. Post-procedure instructions were reviewed.  Patient left the clinic in stable condition.    Clinical History: MRI LUMBAR SPINE WITHOUT CONTRAST 09/27/2012   Technique: Multiplanar and multiecho pulse sequences of the lumbar spine were  obtained without intravenous contrast.   Comparison: 05/19/2012   Findings: A transitional lumbosacral vertebra is assumed to represent the S1 level. If procedural intervention is to be performed, careful correlation with this numbering strategy is recommended. This is the numbering strategy used on the prior exam.   The conus medullaris appears unremarkable. Conus level: L1-2.   There is 3 mm of degenerative posterior subluxation of L5-S1.   Mild disc desiccation is noted at all levels between L2 and S1. Inversion recovery weighted images demonstrate no significant abnormal vertebral or periligamentous edema.   Small Schmorl's nodes are present along the endplates of L2. There is an inferior endplate Schmorl's node at T11.   A single right-sided T2 hyperintense, T1 hypointense renal lesion is noted; this is statistically likely to represent cyst but is not fully evaluated on today's lumbar spine MRI.   No change in appearance of suspected atypical hemangioma in the L5 vertebral body.   Mild disc bulge suspected at T11-12, without impingement. Additional findings at individual levels are as follows:   L1-2: Unremarkable.   L2-3: No impingement. Mild disc bulge.   L3-4: No impingement. Mild disc bulge.   L4-5: No impingement. Mild disc bulge.   L5-S1: No impingement. Diffuse disc bulge and facet arthropathy.   S1-2: No impingement. The disc space at this level is somewhat rudimentary.   IMPRESSION:   1. Degenerative disc disease and lower lumbar spondylosis, without impingement identified.  She reports that  has never smoked. she has never used smokeless tobacco. No results for input(s): HGBA1C, LABURIC in the last 8760 hours.  Objective:  VS:  HT:    WT:   BMI:     BP:(!) 147/85  HR:65bpm  TEMP:98.3 F (36.8 C)(Oral)  RESP:100 % Physical Exam  Musculoskeletal:  They stand and ambulate with a forward flexed lumbar spine.  There is low back pain with  extension of the lumbar spine.  There is good distal strength.    Ortho Exam Imaging: No results found.  Past Medical/Family/Surgical/Social History: Medications & Allergies reviewed per EMR Patient Active Problem List   Diagnosis Date Noted  . Dyspnea on exertion 10/16/2016  . Essential hypertension 10/16/2016  . Morbid obesity due to excess calories ([place]) 10/16/2016  . IBS (irritable bowel syndrome) 02/29/2012  . Tibial plateau fracture 05/10/2011  . HELICOBACTER PYLORI INFECTION 08/25/2009  . DEPRESSION 08/25/2009  . ASTHMA 08/25/2009  . GASTROESOPHAGEAL REFLUX DISEASE, CHRONIC 08/25/2009  . ALLERGY 08/25/2009  . CHOLELITHIASIS, HX OF 08/25/2009   Past Medical History:  Diagnosis Date  . Anxiety   . Asthma   . DDD (degenerative disc disease)    chronic back pain  . Fibromyalgia   . Foot pain   . GERD (gastroesophageal reflux disease)   . Hypertension   . IBS (irritable bowel syndrome)   . Mood disorder ([place])   . Venous insufficiency   . Wears glasses    Family History  Problem Relation Age of Onset  . Diabetes Mother   . Coronary artery disease Mother   . COPD Mother   . Colon polyps Mother        age 45  . Lung cancer Father  Past Surgical History:  Procedure Laterality Date  . CARPAL TUNNEL RELEASE Right 06/10/2013   Procedure: CARPAL TUNNEL RELEASE;  Surgeon: Wynonia Sours, MD;  Location: [facility];  Service: Orthopedics;  Laterality: Right;  . COLONOSCOPY  12/20/11   [name]-single diminutive sessile polyp in the sigmoid colon/small interenal hemorrhoids  . ESOPHAGOGASTRODUODENOSCOPY  04/10/2003   Normal esophagus/Antral erosions, as described above.  The remainder of the stomach appeared normal.  Normal first and second parts of the duodenum.  . ESOPHAGOGASTRODUODENOSCOPY  12/20/11   [name]-hiatus hernia/chronic gastritis/normal duodenumNEGATIVE H pylori, negative SB biopsy  . KNEE ARTHROPLASTY    . TIBIA FRACTURE SURGERY  2012     right   Social History   Occupational History  . Occupation: unemployed  Tobacco Use  . Smoking status: Never Smoker  . Smokeless tobacco: Never Used  Substance and Sexual Activity  . Alcohol use: Yes    Comment: socially, couple times per week  . Drug use: No    Comment: remote maijuana  . Sexual activity: No    Birth control/protection: None

## 2017-11-22 ENCOUNTER — Telehealth: Payer: Self-pay | Admitting: Internal Medicine

## 2017-11-22 NOTE — Telephone Encounter (Signed)
Pt was triaged recently and said that she forgot to tell the nurse that she has a c pap. She is schedule with RMR on 3/20. Please call her at [phone]

## 2017-11-23 NOTE — Telephone Encounter (Signed)
I spoke with the pt.  

## 2017-11-28 ENCOUNTER — Encounter (HOSPITAL_COMMUNITY): Payer: Self-pay

## 2017-11-28 ENCOUNTER — Ambulatory Visit (HOSPITAL_COMMUNITY)
Admission: RE | Admit: 2017-11-28 | Discharge: 2017-11-28 | Disposition: A | Discharge status: Home / Self Care | Payer: Medicare HMO | Source: Ambulatory Visit | Attending: Internal Medicine | Admitting: Internal Medicine

## 2017-11-28 ENCOUNTER — Encounter (HOSPITAL_COMMUNITY)
Admission: RE | Disposition: A | Discharge status: Home / Self Care | Payer: Self-pay | Source: Ambulatory Visit | Attending: Internal Medicine

## 2017-11-28 ENCOUNTER — Other Ambulatory Visit: Payer: Self-pay

## 2017-11-28 DIAGNOSIS — Z86010 Personal history of colonic polyps: Secondary | ICD-10-CM | POA: Diagnosis not present

## 2017-11-28 DIAGNOSIS — Z7982 Long term (current) use of aspirin: Secondary | ICD-10-CM | POA: Diagnosis not present

## 2017-11-28 DIAGNOSIS — K5730 Diverticulosis of large intestine without perforation or abscess without bleeding: Secondary | ICD-10-CM | POA: Insufficient documentation

## 2017-11-28 DIAGNOSIS — Z96659 Presence of unspecified artificial knee joint: Secondary | ICD-10-CM | POA: Insufficient documentation

## 2017-11-28 DIAGNOSIS — I10 Essential (primary) hypertension: Secondary | ICD-10-CM | POA: Diagnosis not present

## 2017-11-28 DIAGNOSIS — K219 Gastro-esophageal reflux disease without esophagitis: Secondary | ICD-10-CM | POA: Insufficient documentation

## 2017-11-28 DIAGNOSIS — Z1211 Encounter for screening for malignant neoplasm of colon: Secondary | ICD-10-CM | POA: Diagnosis not present

## 2017-11-28 DIAGNOSIS — Z79899 Other long term (current) drug therapy: Secondary | ICD-10-CM | POA: Diagnosis not present

## 2017-11-28 DIAGNOSIS — Z791 Long term (current) use of non-steroidal anti-inflammatories (NSAID): Secondary | ICD-10-CM | POA: Diagnosis not present

## 2017-11-28 DIAGNOSIS — M797 Fibromyalgia: Secondary | ICD-10-CM | POA: Insufficient documentation

## 2017-11-28 HISTORY — PX: COLONOSCOPY: SHX5424

## 2017-11-28 SURGERY — COLONOSCOPY
Anesthesia: Moderate Sedation

## 2017-11-28 MED ORDER — SODIUM CHLORIDE 0.9 % IV SOLN
INTRAVENOUS | Status: DC
  Administered 2017-11-28: 10:00:00 via INTRAVENOUS

## 2017-11-28 MED ORDER — MEPERIDINE HCL 100 MG/ML IJ SOLN
INTRAMUSCULAR | Status: DC
Start: 2017-11-28 — End: 2017-11-28
  Filled 2017-11-28: qty 2

## 2017-11-28 MED ORDER — ONDANSETRON HCL 4 MG/2ML IJ SOLN
INTRAMUSCULAR | Status: AC
  Administered 2017-11-28: 4 mg via INTRAVENOUS
  Filled 2017-11-28: qty 2

## 2017-11-28 MED ORDER — MEPERIDINE HCL 100 MG/ML IJ SOLN
INTRAMUSCULAR | Status: DC | PRN
  Administered 2017-11-28: 25 mg via INTRAVENOUS
  Administered 2017-11-28 (×2): 50 mg via INTRAVENOUS

## 2017-11-28 MED ORDER — MIDAZOLAM HCL 5 MG/5ML IJ SOLN
INTRAMUSCULAR | Status: DC | PRN
  Administered 2017-11-28: 1 mg via INTRAVENOUS
  Administered 2017-11-28 (×2): 2 mg via INTRAVENOUS

## 2017-11-28 MED ORDER — ONDANSETRON HCL 4 MG/2ML IJ SOLN
INTRAMUSCULAR | Status: DC | PRN
  Administered 2017-11-28: 4 mg via INTRAVENOUS

## 2017-11-28 MED ORDER — SIMETHICONE 40 MG/0.6ML PO SUSP
ORAL | Status: DC | PRN
  Administered 2017-11-28: 2.5 mL

## 2017-11-28 MED ORDER — MIDAZOLAM HCL 5 MG/5ML IJ SOLN
INTRAMUSCULAR | Status: AC
  Filled 2017-11-28: qty 10

## 2017-11-28 MED ORDER — ONDANSETRON HCL 4 MG/2ML IJ SOLN: 4.0000 mg | Freq: Once | INTRAMUSCULAR | Status: DC

## 2017-11-28 MED ORDER — ONDANSETRON HCL 4 MG/2ML IJ SOLN
INTRAMUSCULAR | Status: AC
  Filled 2017-11-28: qty 2

## 2017-11-28 NOTE — Progress Notes (Signed)
Patient states, "I am feeling better, I want to get dressed and go home".

## 2017-11-28 NOTE — H&P (Signed)
@LOGO @   Primary Care Physician:  Sandi Mealy, MD Primary Gastroenterologist:  Dr. Gala Romney  Pre-Procedure History & Physical: HPI:  Marie Farmer is a 62 y.o. female here for here for surveillance colonoscopy.  History colonic polyp removed in [place]. No GI symptoms currently.  Past Medical History:  Diagnosis Date  . Anxiety   . Asthma   . DDD (degenerative disc disease)    chronic back pain  . Fibromyalgia   . Foot pain   . GERD (gastroesophageal reflux disease)   . Hypertension   . IBS (irritable bowel syndrome)   . Mood disorder ([place])   . Venous insufficiency   . Wears glasses     Past Surgical History:  Procedure Laterality Date  . CARPAL TUNNEL RELEASE Right 06/10/2013   Procedure: CARPAL TUNNEL RELEASE;  Surgeon: Wynonia Sours, MD;  Location: [facility];  Service: Orthopedics;  Laterality: Right;  . COLONOSCOPY  12/20/11   [name]-single diminutive sessile polyp in the sigmoid colon/small interenal hemorrhoids  . ESOPHAGOGASTRODUODENOSCOPY  04/10/2003   Normal esophagus/Antral erosions, as described above.  The remainder of the stomach appeared normal.  Normal first and second parts of the duodenum.  . ESOPHAGOGASTRODUODENOSCOPY  12/20/11   [name]-hiatus hernia/chronic gastritis/normal duodenumNEGATIVE H pylori, negative SB biopsy  . KNEE ARTHROPLASTY    . TIBIA FRACTURE SURGERY  2012   right    Prior to Admission medications   Medication Sig Start Date End Date Taking? Authorizing Provider  albuterol (PROVENTIL HFA;VENTOLIN HFA) 108 (90 Base) MCG/ACT inhaler Inhale 1-2 puffs into the lungs every 4 (four) hours as needed for wheezing or shortness of breath.   Yes [provider]  aspirin EC 325 MG tablet Take 325 mg by mouth daily as needed for moderate pain.    Yes [provider]  clotrimazole-betamethasone (LOTRISONE) cream Apply 1 application topically 3 (three) times daily. To finger 10/12/17  Yes [provider]  dexlansoprazole (DEXILANT) 60 MG capsule Take 1 capsule (60 mg total) by mouth daily before breakfast. 03/29/17  Yes Mahala Menghini, PA-C  gabapentin (NEURONTIN) 300 MG capsule Take 300 mg by mouth 4 (four) times daily.  04/18/16  Yes [provider]  ibuprofen (ADVIL,MOTRIN) 200 MG tablet Take 800 mg by mouth every 8 (eight) hours as needed for mild pain or moderate pain.   Yes [provider]  mupirocin ointment (BACTROBAN) 2 % Apply 1 application topically 3 (three) times daily. To finger 10/12/17  Yes [provider]  TOLAK 4 % CREA Apply 1 application topically at bedtime. 10/19/17  Yes [provider]  valsartan-hydrochlorothiazide (DIOVAN HCT) 160-25 MG tablet Take 1 tablet by mouth daily. 10/19/16  Yes Tanda Rockers, MD    Allergies as of 10/15/2017 - Review Complete 10/15/2017  Allergen Reaction Noted  . Adhesive [tape]  05/12/2011  . Cymbalta [duloxetine hcl]  06/04/2013  . Duloxetine    . Latex  05/12/2011  . Nabumetone    . Azithromycin Rash 02/29/2012    Family History  Problem Relation Age of Onset  . Diabetes Mother   . Coronary artery disease Mother   . COPD Mother   . Colon polyps Mother        age 56  . Lung cancer Father     Social History   Socioeconomic History  . Marital status: Divorced    Spouse name: Not on file  . Number of children: 2  . Years of education: Not  on file  . Highest education level: Not on file  Social Needs  . Financial resource strain: Not on file  . Food insecurity - worry: Not on file  . Food insecurity - inability: Not on file  . Transportation needs - medical: Not on file  . Transportation needs - non-medical: Not on file  Occupational History  . Occupation: unemployed  Tobacco Use  . Smoking status: Never Smoker  . Smokeless tobacco: Never Used  Substance and Sexual Activity  . Alcohol use: Yes    Comment: socially, couple times per week  . Drug use: No    Comment: remote  maijuana  . Sexual activity: No    Birth control/protection: None  Other Topics Concern  . Not on file  Social History Narrative   Lives w/ sister & her husband & daughter    Review of Systems: See HPI, otherwise negative ROS  Physical Exam: BP 131/71   Pulse 75   Temp 98.3 F (36.8 C) (Oral)   Resp 11   Ht 5\' 3"  (1.6 m)   Wt 216 lb (98 kg)   SpO2 96%   BMI 38.26 kg/m  General:   Alert,  Well-developed, well-nourished, pleasant and cooperative in NAD Neck:  Supple; no masses or thyromegaly. No significant cervical adenopathy. Lungs:  Clear throughout to auscultation.   No wheezes, crackles, or rhonchi. No acute distress. Heart:  Regular rate and rhythm; no murmurs, clicks, rubs,  or gallops. Abdomen: Non-distended, normal bowel sounds.  Soft and nontender without appreciable mass or hepatosplenomegaly.  Pulses:  Normal pulses noted. Extremities:  Without clubbing or edema.  Impression:  History of colonic adenoma. Here for surveillance colonoscopy  Recommendations:    I have offered the patient surveillance colonoscopy today. The risks, benefits, limitations, alternatives and imponderables have been reviewed with the patient. Questions have been answered. All parties are agreeable.      Notice: This dictation was prepared with Dragon dictation along with smaller phrase technology. Any transcriptional errors that result from this process are unintentional and may not be corrected upon review.

## 2017-11-28 NOTE — Progress Notes (Signed)
Patient complains of nausea.  Dr. Gala Romney notified. Orders received. Patient tolerating sips of gingerale and saltine cracker.  Patient does report history of "motion sickness"

## 2017-11-28 NOTE — Op Note (Signed)
[facility] Patient Name: Marie Farmer Procedure Date: 11/28/2017 9:42 AM MRN: [id number] Date of Birth: March 06, 1956 Attending MD: Norvel Richards , MD CSN: [id number] Age: 62 Admit Type: Outpatient Procedure:                Colonoscopy Indications:              High risk colon cancer surveillance: Personal                            history of colonic polyps Providers:                Norvel Richards, MD, Lurline Del, RN, Nelma Rothman, Technician Referring MD:              Medicines:                Midazolam 5 mg IV, Meperidine 573 mg IV Complications:            No immediate complications. Estimated Blood Loss:     Estimated blood loss: none. Procedure:                Pre-Anesthesia Assessment:                           - Prior to the procedure, a History and Physical                            was performed, and patient medications and                            allergies were reviewed. The patient's tolerance of                            previous anesthesia was also reviewed. The risks                            and benefits of the procedure and the sedation                            options and risks were discussed with the patient.                            All questions were answered, and informed consent                            was obtained. Prior Anticoagulants: The patient has                            taken no previous anticoagulant or antiplatelet                            agents. ASA Grade Assessment: II - A patient with  mild systemic disease. After reviewing the risks                            and benefits, the patient was deemed in                            satisfactory condition to undergo the procedure.                           After obtaining informed consent, the colonoscope                            was passed under direct vision. Throughout the                            procedure, the  patient's blood pressure, pulse, and                            oxygen saturations were monitored continuously. The                            EC-[id number] ([id number]) scope was introduced through                            the anus and advanced to the the cecum, identified                            by appendiceal orifice and ileocecal valve. The                            colonoscopy was performed without difficulty. The                            patient tolerated the procedure well. The quality                            of the bowel preparation was adequate. The entire                            colon was well visualized. The quality of the bowel                            preparation was adequate. The terminal ileum,                            ileocecal valve, appendiceal orifice, and rectum                            and the ileocecal valve, appendiceal orifice, and                            rectum were photographed. Scope In: 9:53:18 AM Scope Out: 10:11:06 AM Scope Withdrawal Time: 0 hours 6 minutes 34 seconds  Total Procedure Duration:  0 hours 17 minutes 48 seconds  Findings:      The perianal and digital rectal examinations were normal.      Scattered medium-mouthed diverticula were found in the sigmoid colon and       descending colon.      The exam was otherwise without abnormality on direct and retroflexion       views. Impression:               - Diverticulosis in the sigmoid colon and in the                            descending colon.                           - The examination was otherwise normal on direct                            and retroflexion views.                           - No specimens collected. Moderate Sedation:      Moderate (conscious) sedation was administered by the endoscopy nurse       and supervised by the endoscopist. The following parameters were       monitored: oxygen saturation, heart rate, blood pressure, respiratory       rate, EKG, adequacy  of pulmonary ventilation, and response to care.       Total physician intraservice time was 21 minutes. Recommendation:           - Patient has a contact number available for                            emergencies. The signs and symptoms of potential                            delayed complications were discussed with the                            patient. Return to normal activities tomorrow.                            Written discharge instructions were provided to the                            patient.                           - Resume previous diet.                           - Continue present medications.                           - Since tiny polyp removed in 2013, we can return                            her to average screening pool. Repeat colonoscopy  in 10 years for screening purposes.                           - Return to GI clinic PRN. Procedure Code(s):        --- Professional ---                           [phone], Colonoscopy, flexible; diagnostic, including                            collection of specimen(s) by brushing or washing,                            when performed (separate procedure)                           99152, Moderate sedation services provided by the                            same physician or other qualified health care                            professional performing the diagnostic or                            therapeutic service that the sedation supports,                            requiring the presence of an independent trained                            observer to assist in the monitoring of the                            patient's level of consciousness and physiological                            status; initial 15 minutes of intraservice time,                            patient age 69 years or older Diagnosis Code(s):        --- Professional ---                           Z86.010, Personal history of colonic  polyps                           K57.30, Diverticulosis of large intestine without                            perforation or abscess without bleeding CPT copyright 2016 [employer]. All rights reserved. The codes documented in this report are preliminary and upon coder review may  be revised to meet current compliance requirements. Cristopher Estimable. Cristan Hout, MD Norvel Richards, MD 11/28/2017 10:22:32 AM This report has  been signed electronically. Number of Addenda: 0

## 2017-11-28 NOTE — Discharge Instructions (Signed)
Colonoscopy Discharge Instructions  Read the instructions outlined below and refer to this sheet in the next few weeks. These discharge instructions provide you with general information on caring for yourself after you leave the hospital. Your doctor may also give you specific instructions. While your treatment has been planned according to the most current medical practices available, unavoidable complications occasionally occur. If you have any problems or questions after discharge, call Dr. Gala Romney at [phone]. ACTIVITY  You may resume your regular activity, but move at a slower pace for the next 24 hours.   Take frequent rest periods for the next 24 hours.   Walking will help get rid of the air and reduce the bloated feeling in your belly (abdomen).   No driving for 24 hours (because of the medicine (anesthesia) used during the test).    Do not sign any important legal documents or operate any machinery for 24 hours (because of the anesthesia used during the test).  NUTRITION  Drink plenty of fluids.   You may resume your normal diet as instructed by your doctor.   Begin with a light meal and progress to your normal diet. Heavy or fried foods are harder to digest and may make you feel sick to your stomach (nauseated).   Avoid alcoholic beverages for 24 hours or as instructed.  MEDICATIONS  You may resume your normal medications unless your doctor tells you otherwise.  WHAT YOU CAN EXPECT TODAY  Some feelings of bloating in the abdomen.   Passage of more gas than usual.   Spotting of blood in your stool or on the toilet paper.  IF YOU HAD POLYPS REMOVED DURING THE COLONOSCOPY:  No aspirin products for 7 days or as instructed.   No alcohol for 7 days or as instructed.   Eat a soft diet for the next 24 hours.  FINDING OUT THE RESULTS OF YOUR TEST Not all test results are available during your visit. If your test results are not back during the visit, make an appointment  with your caregiver to find out the results. Do not assume everything is normal if you have not heard from your caregiver or the medical facility. It is important for you to follow up on all of your test results.  SEEK IMMEDIATE MEDICAL ATTENTION IF:  You have more than a spotting of blood in your stool.   Your belly is swollen (abdominal distention).   You are nauseated or vomiting.   You have a temperature over 101.   You have abdominal pain or discomfort that is severe or gets worse throughout the day.    Diverticulosis information provided  Repeat colonoscopy for screening purposes in 2 years     Diverticulosis Diverticulosis is a condition that develops when small pouches (diverticula) form in the wall of the large intestine (colon). The colon is where water is absorbed and stool is formed. The pouches form when the inside layer of the colon pushes through weak spots in the outer layers of the colon. You may have a few pouches or many of them. What are the causes? The cause of this condition is not known. What increases the risk? The following factors may make you more likely to develop this condition:  Being older than age 82. Your risk for this condition increases with age. Diverticulosis is rare among people younger than age 35. By age 55, many people have it.  Eating a low-fiber diet.  Having frequent constipation.  Being overweight.  Not  getting enough exercise.  Smoking.  Taking over-the-counter pain medicines, like aspirin and ibuprofen.  Having a family history of diverticulosis.  What are the signs or symptoms? In most people, there are no symptoms of this condition. If you do have symptoms, they may include:  Bloating.  Cramps in the abdomen.  Constipation or diarrhea.  Pain in the lower left side of the abdomen.  How is this diagnosed? This condition is most often diagnosed during an exam for other colon problems. Because diverticulosis usually  has no symptoms, it often cannot be diagnosed independently. This condition may be diagnosed by:  Using a flexible scope to examine the colon (colonoscopy).  Taking an X-ray of the colon after dye has been put into the colon (barium enema).  Doing a CT scan.  How is this treated? You may not need treatment for this condition if you have never developed an infection related to diverticulosis. If you have had an infection before, treatment may include:  Eating a high-fiber diet. This may include eating more fruits, vegetables, and grains.  Taking a fiber supplement.  Taking a live bacteria supplement (probiotic).  Taking medicine to relax your colon.  Taking antibiotic medicines.  Follow these instructions at home:  Drink 6-8 glasses of water or more each day to prevent constipation.  Try not to strain when you have a bowel movement.  If you have had an infection before: ? Eat more fiber as directed by your health care provider or your diet and nutrition specialist (dietitian). ? Take a fiber supplement or probiotic, if your health care provider approves.  Take over-the-counter and prescription medicines only as told by your health care provider.  If you were prescribed an antibiotic, take it as told by your health care provider. Do not stop taking the antibiotic even if you start to feel better.  Keep all follow-up visits as told by your health care provider. This is important. Contact a health care provider if:  You have pain in your abdomen.  You have bloating.  You have cramps.  You have not had a bowel movement in 3 days. Get help right away if:  Your pain gets worse.  Your bloating becomes very bad.  You have a fever or chills, and your symptoms suddenly get worse.  You vomit.  You have bowel movements that are bloody or black.  You have bleeding from your rectum. Summary  Diverticulosis is a condition that develops when small pouches (diverticula) form  in the wall of the large intestine (colon).  You may have a few pouches or many of them.  This condition is most often diagnosed during an exam for other colon problems.  If you have had an infection related to diverticulosis, treatment may include increasing the fiber in your diet, taking supplements, or taking medicines. This information is not intended to replace advice given to you by your health care provider. Make sure you discuss any questions you have with your health care provider. Document Released: 05/25/2004 Document Revised: 07/17/2016 Document Reviewed: 07/17/2016 Elsevier Interactive Patient Education  2017 [employer].     Hemorrhoids Hemorrhoids are swollen veins in and around the rectum or anus. There are two types of hemorrhoids:  Internal hemorrhoids. These occur in the veins that are just inside the rectum. They may poke through to the outside and become irritated and painful.  External hemorrhoids. These occur in the veins that are outside of the anus and can be felt as a painful  swelling or hard lump near the anus.  Most hemorrhoids do not cause serious problems, and they can be managed with home treatments such as diet and lifestyle changes. If home treatments do not help your symptoms, procedures can be done to shrink or remove the hemorrhoids. What are the causes? This condition is caused by increased pressure in the anal area. This pressure may result from various things, including:  Constipation.  Straining to have a bowel movement.  Diarrhea.  Pregnancy.  Obesity.  Sitting for long periods of time.  Heavy lifting or other activity that causes you to strain.  Anal sex.  What are the signs or symptoms? Symptoms of this condition include:  Pain.  Anal itching or irritation.  Rectal bleeding.  Leakage of stool (feces).  Anal swelling.  One or more lumps around the anus.  How is this diagnosed? This condition can often be diagnosed  through a visual exam. Other exams or tests may also be done, such as:  Examination of the rectal area with a gloved hand (digital rectal exam).  Examination of the anal canal using a small tube (anoscope).  A blood test, if you have lost a significant amount of blood.  A test to look inside the colon (sigmoidoscopy or colonoscopy).  How is this treated? This condition can usually be treated at home. However, various procedures may be done if dietary changes, lifestyle changes, and other home treatments do not help your symptoms. These procedures can help make the hemorrhoids smaller or remove them completely. Some of these procedures involve surgery, and others do not. Common procedures include:  Rubber band ligation. Rubber bands are placed at the base of the hemorrhoids to cut off the blood supply to them.  Sclerotherapy. Medicine is injected into the hemorrhoids to shrink them.  Infrared coagulation. A type of light energy is used to get rid of the hemorrhoids.  Hemorrhoidectomy surgery. The hemorrhoids are surgically removed, and the veins that supply them are tied off.  Stapled hemorrhoidopexy surgery. A circular stapling device is used to remove the hemorrhoids and use staples to cut off the blood supply to them.  Follow these instructions at home: Eating and drinking  Eat foods that have a lot of fiber in them, such as whole grains, beans, nuts, fruits, and vegetables. Ask your health care provider about taking products that have added fiber (fiber supplements).  Drink enough fluid to keep your urine clear or pale yellow. Managing pain and swelling  Take warm sitz baths for 20 minutes, 3-4 times a day to ease pain and discomfort.  If directed, apply ice to the affected area. Using ice packs between sitz baths may be helpful. ? Put ice in a plastic bag. ? Place a towel between your skin and the bag. ? Leave the ice on for 20 minutes, 2-3 times a day. General  instructions  Take over-the-counter and prescription medicines only as told by your health care provider.  Use medicated creams or suppositories as told.  Exercise regularly.  Go to the bathroom when you have the urge to have a bowel movement. Do not wait.  Avoid straining to have bowel movements.  Keep the anal area dry and clean. Use wet toilet paper or moist towelettes after a bowel movement.  Do not sit on the toilet for long periods of time. This increases blood pooling and pain. Contact a health care provider if:  You have increasing pain and swelling that are not controlled by treatment or  medicine.  You have uncontrolled bleeding.  You have difficulty having a bowel movement, or you are unable to have a bowel movement.  You have pain or inflammation outside the area of the hemorrhoids. This information is not intended to replace advice given to you by your health care provider. Make sure you discuss any questions you have with your health care provider. Document Released: 08/25/2000 Document Revised: 01/26/2016 Document Reviewed: 05/12/2015 Elsevier Interactive Patient Education  [employer].

## 2017-12-03 ENCOUNTER — Encounter (HOSPITAL_COMMUNITY): Payer: Self-pay | Admitting: Internal Medicine

## 2017-12-12 ENCOUNTER — Other Ambulatory Visit (INDEPENDENT_AMBULATORY_CARE_PROVIDER_SITE_OTHER): Payer: Self-pay | Admitting: Physical Medicine and Rehabilitation

## 2017-12-12 ENCOUNTER — Telehealth (INDEPENDENT_AMBULATORY_CARE_PROVIDER_SITE_OTHER): Payer: Self-pay | Admitting: *Deleted

## 2017-12-12 DIAGNOSIS — M5416 Radiculopathy, lumbar region: Secondary | ICD-10-CM

## 2017-12-12 DIAGNOSIS — M47816 Spondylosis without myelopathy or radiculopathy, lumbar region: Secondary | ICD-10-CM

## 2017-12-12 DIAGNOSIS — M5442 Lumbago with sciatica, left side: Secondary | ICD-10-CM

## 2017-12-12 DIAGNOSIS — G8929 Other chronic pain: Secondary | ICD-10-CM

## 2017-12-12 DIAGNOSIS — M545 Low back pain, unspecified: Secondary | ICD-10-CM

## 2017-12-12 NOTE — Telephone Encounter (Signed)
I put the referral in epic with the doctor's name and fax number and address in the section where you can type and comments.  Obviously epic will not let me someone directly there.  The notes are in the chart for her.  Also along with the referral should be anybody that sees her here in our practice.

## 2017-12-14 ENCOUNTER — Telehealth (INDEPENDENT_AMBULATORY_CARE_PROVIDER_SITE_OTHER): Payer: Self-pay | Admitting: Orthopedic Surgery

## 2017-12-14 NOTE — Telephone Encounter (Signed)
IC over to Dr. Walden Field office [place] and she advised me the fax number given was incorrect. I refaxed the order to [phone], she will call me within 30 mins to advise me if received fax and if so then she will contact pt to schedule.

## 2017-12-14 NOTE — Telephone Encounter (Signed)
IC pt and advised her of this and she will wait to hear back from someone to schedule

## 2017-12-14 NOTE — Telephone Encounter (Signed)
Ms. Bales called this morning to check on the status of her referral to Dr. Walden Field.  Can someone please take a look and get this started for her today.  Thank you!

## 2017-12-14 NOTE — Telephone Encounter (Signed)
Looks like the referral was faxed yesterday.

## 2017-12-17 ENCOUNTER — Telehealth (INDEPENDENT_AMBULATORY_CARE_PROVIDER_SITE_OTHER): Payer: Self-pay | Admitting: Radiology

## 2017-12-17 ENCOUNTER — Telehealth (INDEPENDENT_AMBULATORY_CARE_PROVIDER_SITE_OTHER): Payer: Self-pay | Admitting: Physical Medicine and Rehabilitation

## 2017-12-17 NOTE — Telephone Encounter (Signed)
Patient requests her x-rays be mailed to Dr. Walden Field in [place], [place]. Referral was placed by Dr. Ernestina Patches.  X-rays put on CD and mailed to  Dr. Kerby Moors [address] [place], [place]

## 2017-12-17 NOTE — Telephone Encounter (Signed)
Records faxed to Dr. Walden Field for referral

## 2018-01-28 ENCOUNTER — Telehealth (INDEPENDENT_AMBULATORY_CARE_PROVIDER_SITE_OTHER): Payer: Self-pay | Admitting: *Deleted

## 2018-01-28 ENCOUNTER — Other Ambulatory Visit (INDEPENDENT_AMBULATORY_CARE_PROVIDER_SITE_OTHER): Payer: Self-pay | Admitting: Physical Medicine and Rehabilitation

## 2018-01-28 DIAGNOSIS — M4306 Spondylolysis, lumbar region: Secondary | ICD-10-CM

## 2018-01-28 NOTE — Telephone Encounter (Signed)
Please advise 

## 2018-01-28 NOTE — Telephone Encounter (Signed)
Would be happy to order - need it here or [place] and if [place] where?

## 2018-01-28 NOTE — Telephone Encounter (Signed)
Pt called states she is wanting to get an MRI of her back, says she went to [place] and the doctor there stated she is needing one to and wants to know if you could order MRI. Please advise.

## 2018-01-28 NOTE — Telephone Encounter (Signed)
Patient is back in [place]. MRI ordered.

## 2018-02-01 ENCOUNTER — Ambulatory Visit (HOSPITAL_COMMUNITY)
Admission: RE | Admit: 2018-02-01 | Discharge: 2018-02-01 | Disposition: A | Discharge status: Home / Self Care | Payer: Medicare HMO | Source: Ambulatory Visit | Attending: Physical Medicine and Rehabilitation | Admitting: Physical Medicine and Rehabilitation

## 2018-02-01 DIAGNOSIS — G4733 Obstructive sleep apnea (adult) (pediatric): Secondary | ICD-10-CM | POA: Insufficient documentation

## 2018-02-01 DIAGNOSIS — M4306 Spondylolysis, lumbar region: Secondary | ICD-10-CM

## 2018-02-01 DIAGNOSIS — M47896 Other spondylosis, lumbar region: Secondary | ICD-10-CM | POA: Diagnosis not present

## 2018-02-01 DIAGNOSIS — M47897 Other spondylosis, lumbosacral region: Secondary | ICD-10-CM | POA: Insufficient documentation

## 2018-02-05 ENCOUNTER — Telehealth (INDEPENDENT_AMBULATORY_CARE_PROVIDER_SITE_OTHER): Payer: Self-pay | Admitting: Physical Medicine and Rehabilitation

## 2018-02-05 NOTE — Telephone Encounter (Signed)
Patient needs auth for [id number].

## 2018-02-05 NOTE — Telephone Encounter (Signed)
General L5-S1 interlam esi is ok but MRI with no stenosis, nerve compression or herniated discs, may do well with new approach to PT/Chiro etc

## 2018-02-06 ENCOUNTER — Telehealth (INDEPENDENT_AMBULATORY_CARE_PROVIDER_SITE_OTHER): Payer: Self-pay | Admitting: *Deleted

## 2018-02-06 NOTE — Telephone Encounter (Signed)
Auth No / Request ID [id number] Status Auto-Approved Decision Approved Effective Date 02/11/2018 Expiration Date 03/28/2018

## 2018-02-08 NOTE — Telephone Encounter (Signed)
Auth No / Request ID [id number] Status Auto-Approved Decision Approved Effective Date 02/11/2018 Expiration Date 03/28/2018

## 2018-02-17 ENCOUNTER — Other Ambulatory Visit: Payer: Self-pay | Admitting: Internal Medicine

## 2018-02-18 ENCOUNTER — Telehealth (INDEPENDENT_AMBULATORY_CARE_PROVIDER_SITE_OTHER): Payer: Self-pay

## 2018-02-18 NOTE — Telephone Encounter (Signed)
Patient called triage phone this morning with several questions in regards to the injection that she has scheduled with Dr Ernestina Patches on 02/27/18. 1-wants to know if he is planning on doing the same injection that was done in March, because if so it is pointless as the one she had in March did not help her at all. 2-wants to know how soon after injection she can travel/fly? Is asking for call back ASAP because she needs to book her flight if she is going to be able to travel Please call her to discuss/advise [phone]

## 2018-02-18 NOTE — Telephone Encounter (Signed)
Called patient and left a message advising that this injection is different from the one in March and that she can travel 24 hours after the injection.

## 2018-02-27 ENCOUNTER — Ambulatory Visit (INDEPENDENT_AMBULATORY_CARE_PROVIDER_SITE_OTHER): Payer: Medicare HMO

## 2018-02-27 ENCOUNTER — Encounter

## 2018-02-27 ENCOUNTER — Ambulatory Visit (INDEPENDENT_AMBULATORY_CARE_PROVIDER_SITE_OTHER): Payer: Medicare HMO | Admitting: Physical Medicine and Rehabilitation

## 2018-02-27 ENCOUNTER — Encounter (INDEPENDENT_AMBULATORY_CARE_PROVIDER_SITE_OTHER): Payer: Self-pay | Admitting: Physical Medicine and Rehabilitation

## 2018-02-27 VITALS — BP 139/77 | HR 67

## 2018-02-27 DIAGNOSIS — M7918 Myalgia, other site: Secondary | ICD-10-CM

## 2018-02-27 DIAGNOSIS — M47816 Spondylosis without myelopathy or radiculopathy, lumbar region: Secondary | ICD-10-CM

## 2018-02-27 DIAGNOSIS — M5416 Radiculopathy, lumbar region: Secondary | ICD-10-CM

## 2018-02-27 MED ORDER — BUPIVACAINE HCL 0.5 % IJ SOLN
3.0000 mL | Freq: Once | INTRAMUSCULAR | Status: AC
  Administered 2018-02-27: 3 mL

## 2018-02-27 MED ORDER — METHYLPREDNISOLONE ACETATE 80 MG/ML IJ SUSP
80.0000 mg | Freq: Once | INTRAMUSCULAR | Status: AC
  Administered 2018-02-27: 80 mg

## 2018-02-27 NOTE — Patient Instructions (Signed)

## 2018-02-27 NOTE — Progress Notes (Signed)
Marie Farmer - 62 y.o. female MRN [id number]  Date of birth: October 04, 1955  Office Visit Note: Visit Date: 02/27/2018 PCP: Sandi Mealy, MD Referred by: Sandi Mealy, MD  Subjective: Chief Complaint  Patient presents with  . Lower Back - Pain  . Right Leg - Pain  . Left Leg - Pain   HPI: Marie Farmer is a 62 year old female with history of chronic pain syndrome and chronic low back pain and radicular pain and fibromyalgia.  We started seeing her in February and I have not seen her since 2014.  She had followed with Dr. Durward Fortes for orthopedic care.  We have completed facet joint blocks which were not beneficial.  She has a complicated case from the standpoint that she is moving to [place] and recently went to [place] and we did send the referral for her to see a doctor in [place].  He is evidently a sports medicine physician of some sort whether his orthopedic or family practice.  He suggested repeating her MRI which we did send in order and to have the MRI repeated.  I am unsure again why he did not order the MRI.  Regardless the patient came in today for review of the MRI which is essentially normal MRI for her age with some facet arthropathy at L5-S1 with very minimal retrolisthesis and disc bulging but no disc herniation or annular tearing or focal nerve compression.  She does have a transitional segment.  We went over the images at length with her and her driver.  There is nothing at least on the MRI that would explain really radicular leg pain.  After reviewing the images with her and showing her the nature of anatomy of the lumbar spine and typical pain producing elements we decided to complete diagnostic medial branch blocks just to see once and for all if the facet joints were causing a lot of her pain.  I suspect that the arthritic changes may be causing her symptoms and then she is getting exacerbation and increased referral pattern to her fibromyalgia.  If she does not get  relief with the medial branch blocks and obviously we could try one time an epidural injection.  We also gave her a prescription for physical therapy and dry needling.  She can take that prescription to someone close to her house at this point and see if she can get dry needling otherwise she will come to [place] and we can arrange that.  Again she is moving to [facility] in August.  She gets worsening pain with standing more than sitting which is again consistent with facet arthropathy.   ROS Otherwise per HPI.  Assessment & Plan: Visit Diagnoses:  1. Lumbar radiculopathy   2. Myofascial pain syndrome   3. Spondylosis without myelopathy or radiculopathy, lumbar region     Plan: No additional findings.   Meds & Orders:  Meds ordered this encounter  Medications  . methylPREDNISolone acetate (DEPO-MEDROL) injection 80 mg  . bupivacaine (MARCAINE) 0.5 % (with pres) injection 3 mL    Orders Placed This Encounter  Procedures  . Facet Injection  . XR C-ARM NO REPORT    Follow-up: No follow-ups on file.   Procedures: No procedures performed  Lumbar Diagnostic Facet Joint Nerve Block with Fluoroscopic Guidance   Patient: Marie Farmer      Date of Birth: 1955-10-24 MRN: [id number] PCP: Sandi Mealy, MD      Visit Date: 02/27/2018   Universal Protocol:  Date/Time: 06/26/195:37 AM  Consent Given By: the patient  Position: PRONE  Additional Comments: Vital signs were monitored before and after the procedure. Patient was prepped and draped in the usual sterile fashion. The correct patient, procedure, and site was verified.   Injection Procedure Details:  Procedure Site One Meds Administered:  Meds ordered this encounter  Medications  . methylPREDNISolone acetate (DEPO-MEDROL) injection 80 mg  . bupivacaine (MARCAINE) 0.5 % (with pres) injection 3 mL     Laterality: Bilateral  Location/Site: Bilateral L4 medial branch and bilateral L5 dorsal rami L5-S1  Needle  size: 22 ga.  Needle type:spinal  Needle Placement: Oblique pedical  Findings:   -Comments: There was excellent flow of contrast along the articular pillars without intravascular flow.  Procedure Details: The fluoroscope beam is vertically oriented in AP and then obliqued 15 to 20 degrees to the ipsilateral side of the desired nerve to achieve the "Scotty dog" appearance.  The skin over the target area of the junction of the superior articulating process and the transverse process (sacral ala if blocking the L5 dorsal rami) was locally anesthetized with a 1 ml volume of 1% Lidocaine without Epinephrine.  The spinal needle was inserted and advanced in a trajectory view down to the target.   After contact with periosteum and negative aspirate for blood and CSF, correct placement without intravascular or epidural spread was confirmed by injecting 0.5 ml. of Isovue-250.  A spot radiograph was obtained of this image.    Next, a 0.5 ml. volume of the injectate described above was injected. The needle was then redirected to the other facet joint nerves mentioned above if needed.  Prior to the procedure, the patient was given a Pain Diary which was completed for baseline measurements.  After the procedure, the patient rated their pain every 30 minutes and will continue rating at this frequency for a total of 5 hours.  The patient has been asked to complete the Diary and return to [place] by mail, fax or hand delivered as soon as possible.   Additional Comments:  The patient tolerated the procedure well Dressing: Band-Aid    Post-procedure details: Patient was observed during the procedure. Post-procedure instructions were reviewed.  Patient left the clinic in stable condition.   Clinical History: MRI LUMBAR SPINE WITHOUT CONTRAST 09/27/2012   Technique: Multiplanar and multiecho pulse sequences of the lumbar spine were obtained without intravenous contrast.   Comparison: 05/19/2012    Findings: A transitional lumbosacral vertebra is assumed to represent the S1 level. If procedural intervention is to be performed, careful correlation with this numbering strategy is recommended. This is the numbering strategy used on the prior exam.   The conus medullaris appears unremarkable. Conus level: L1-2.   There is 3 mm of degenerative posterior subluxation of L5-S1.   Mild disc desiccation is noted at all levels between L2 and S1. Inversion recovery weighted images demonstrate no significant abnormal vertebral or periligamentous edema.   Small Schmorl's nodes are present along the endplates of L2. There is an inferior endplate Schmorl's node at T11.   A single right-sided T2 hyperintense, T1 hypointense renal lesion is noted; this is statistically likely to represent cyst but is not fully evaluated on today's lumbar spine MRI.   No change in appearance of suspected atypical hemangioma in the L5 vertebral body.   Mild disc bulge suspected at T11-12, without impingement. Additional findings at individual levels are as follows:   L1-2: Unremarkable.   L2-3: No impingement. Mild  disc bulge.   L3-4: No impingement. Mild disc bulge.   L4-5: No impingement. Mild disc bulge.   L5-S1: No impingement. Diffuse disc bulge and facet arthropathy.   S1-2: No impingement. The disc space at this level is somewhat rudimentary.   IMPRESSION:   1. Degenerative disc disease and lower lumbar spondylosis, without impingement identified.   She reports that she has never smoked. She has never used smokeless tobacco. No results for input(s): HGBA1C, LABURIC in the last 8760 hours.  Objective:  VS:  HT:    WT:   BMI:     BP:139/77  HR:67bpm  TEMP: ( )  RESP:  Physical Exam  Ortho Exam Imaging: No results found.  Past Medical/Family/Surgical/Social History: Medications & Allergies reviewed per EMR, new medications updated. Patient Active Problem List   Diagnosis Date  Noted  . Dyspnea on exertion 10/16/2016  . Essential hypertension 10/16/2016  . Morbid obesity due to excess calories ([place]) 10/16/2016  . IBS (irritable bowel syndrome) 02/29/2012  . Tibial plateau fracture 05/10/2011  . HELICOBACTER PYLORI INFECTION 08/25/2009  . DEPRESSION 08/25/2009  . ASTHMA 08/25/2009  . GASTROESOPHAGEAL REFLUX DISEASE, CHRONIC 08/25/2009  . ALLERGY 08/25/2009  . CHOLELITHIASIS, HX OF 08/25/2009   Past Medical History:  Diagnosis Date  . Anxiety   . Asthma   . DDD (degenerative disc disease)    chronic back pain  . Fibromyalgia   . Foot pain   . GERD (gastroesophageal reflux disease)   . Hypertension   . IBS (irritable bowel syndrome)   . Mood disorder ([place])   . Venous insufficiency   . Wears glasses    Family History  Problem Relation Age of Onset  . Diabetes Mother   . Coronary artery disease Mother   . COPD Mother   . Colon polyps Mother        age 71  . Lung cancer Father    Past Surgical History:  Procedure Laterality Date  . CARPAL TUNNEL RELEASE Right 06/10/2013   Procedure: CARPAL TUNNEL RELEASE;  Surgeon: Wynonia Sours, MD;  Location: [facility];  Service: Orthopedics;  Laterality: Right;  . COLONOSCOPY  12/20/11   [name]-single diminutive sessile polyp in the sigmoid colon/small interenal hemorrhoids  . COLONOSCOPY N/A 11/28/2017   Procedure: COLONOSCOPY;  Surgeon: Daneil Dolin, MD;  [facility];  Service: Endoscopy;  Laterality: N/A;  10:30  . ESOPHAGOGASTRODUODENOSCOPY  04/10/2003   Normal esophagus/Antral erosions, as described above.  The remainder of the stomach appeared normal.  Normal first and second parts of the duodenum.  . ESOPHAGOGASTRODUODENOSCOPY  12/20/11   [name]-hiatus hernia/chronic gastritis/normal duodenumNEGATIVE H pylori, negative SB biopsy  . KNEE ARTHROPLASTY    . TIBIA FRACTURE SURGERY  2012   right   Social History   Occupational History  . Occupation: unemployed   Tobacco Use  . Smoking status: Never Smoker  . Smokeless tobacco: Never Used  Substance and Sexual Activity  . Alcohol use: Yes    Comment: socially, couple times per week  . Drug use: No    Types: Marijuana    Comment: remote maijuana  . Sexual activity: Never    Birth control/protection: None

## 2018-02-28 ENCOUNTER — Telehealth (INDEPENDENT_AMBULATORY_CARE_PROVIDER_SITE_OTHER): Payer: Self-pay | Admitting: *Deleted

## 2018-03-01 NOTE — Telephone Encounter (Signed)
Yes, I meant to write one on generic rx pad, please remind me. And then we can mail or have her pick up.

## 2018-03-01 NOTE — Telephone Encounter (Signed)
Called pt and advised to pick up PT rx.

## 2018-03-06 NOTE — Progress Notes (Signed)
 .  Numeric Pain Rating Scale and Functional Assessment Average Pain 8   In the last MONTH (on 0-10 scale) has pain interfered with the following?  1. General activity like being  able to carry out your everyday physical activities such as walking, climbing stairs, carrying groceries, or moving a chair?  Rating(7)   +Driver, -BT, -Dye Allergies.  

## 2018-03-06 NOTE — Procedures (Signed)
Lumbar Diagnostic Facet Joint Nerve Block with Fluoroscopic Guidance   Patient: Marie Farmer      Date of Birth: Sep 25, 1955 MRN: [id number] PCP: Sandi Mealy, MD      Visit Date: 02/27/2018   Universal Protocol:    Date/Time: 06/26/195:37 AM  Consent Given By: the patient  Position: PRONE  Additional Comments: Vital signs were monitored before and after the procedure. Patient was prepped and draped in the usual sterile fashion. The correct patient, procedure, and site was verified.   Injection Procedure Details:  Procedure Site One Meds Administered:  Meds ordered this encounter  Medications  . methylPREDNISolone acetate (DEPO-MEDROL) injection 80 mg  . bupivacaine (MARCAINE) 0.5 % (with pres) injection 3 mL     Laterality: Bilateral  Location/Site: Bilateral L4 medial branch and bilateral L5 dorsal rami L5-S1  Needle size: 22 ga.  Needle type:spinal  Needle Placement: Oblique pedical  Findings:   -Comments: There was excellent flow of contrast along the articular pillars without intravascular flow.  Procedure Details: The fluoroscope beam is vertically oriented in AP and then obliqued 15 to 20 degrees to the ipsilateral side of the desired nerve to achieve the "Scotty dog" appearance.  The skin over the target area of the junction of the superior articulating process and the transverse process (sacral ala if blocking the L5 dorsal rami) was locally anesthetized with a 1 ml volume of 1% Lidocaine without Epinephrine.  The spinal needle was inserted and advanced in a trajectory view down to the target.   After contact with periosteum and negative aspirate for blood and CSF, correct placement without intravascular or epidural spread was confirmed by injecting 0.5 ml. of Isovue-250.  A spot radiograph was obtained of this image.    Next, a 0.5 ml. volume of the injectate described above was injected. The needle was then redirected to the other facet joint nerves  mentioned above if needed.  Prior to the procedure, the patient was given a Pain Diary which was completed for baseline measurements.  After the procedure, the patient rated their pain every 30 minutes and will continue rating at this frequency for a total of 5 hours.  The patient has been asked to complete the Diary and return to [place] by mail, fax or hand delivered as soon as possible.   Additional Comments:  The patient tolerated the procedure well Dressing: Band-Aid    Post-procedure details: Patient was observed during the procedure. Post-procedure instructions were reviewed.  Patient left the clinic in stable condition.

## 2018-03-11 ENCOUNTER — Telehealth (INDEPENDENT_AMBULATORY_CARE_PROVIDER_SITE_OTHER): Payer: Self-pay | Admitting: *Deleted

## 2018-03-11 NOTE — Telephone Encounter (Signed)
Submitted prior auth for RFA on [employer], pending. Case number [id number]

## 2018-03-22 ENCOUNTER — Telehealth (INDEPENDENT_AMBULATORY_CARE_PROVIDER_SITE_OTHER): Payer: Self-pay | Admitting: *Deleted

## 2018-03-22 NOTE — Telephone Encounter (Signed)
Auth No / Request ID [id number] Status Review Complete Decision Approved Effective Date 03/11/2018 Expiration Date 04/25/2018 Decision Date 03/12/2018

## 2018-03-22 NOTE — Telephone Encounter (Signed)
Called pt to schedule appt and vm was full and could not leave a vm.

## 2018-03-26 ENCOUNTER — Other Ambulatory Visit (INDEPENDENT_AMBULATORY_CARE_PROVIDER_SITE_OTHER): Payer: Self-pay | Admitting: Physical Medicine and Rehabilitation

## 2018-03-26 ENCOUNTER — Telehealth (INDEPENDENT_AMBULATORY_CARE_PROVIDER_SITE_OTHER): Payer: Self-pay | Admitting: *Deleted

## 2018-03-26 MED ORDER — TRIAZOLAM 0.25 MG PO TABS: ORAL_TABLET | ORAL | 0 refills | Status: DC

## 2018-03-26 NOTE — Progress Notes (Signed)
Pre-procedure triazolam ordered for pre-operative anxiety.  

## 2018-03-29 NOTE — Telephone Encounter (Signed)
error 

## 2018-04-01 ENCOUNTER — Telehealth (INDEPENDENT_AMBULATORY_CARE_PROVIDER_SITE_OTHER): Payer: Self-pay | Admitting: *Deleted

## 2018-04-01 NOTE — Telephone Encounter (Signed)
Pt called wanting more information about RFA and if her insurance approved procedure, advised those question.

## 2018-04-02 DIAGNOSIS — K120 Recurrent oral aphthae: Secondary | ICD-10-CM | POA: Insufficient documentation

## 2018-04-02 DIAGNOSIS — R457 State of emotional shock and stress, unspecified: Secondary | ICD-10-CM | POA: Insufficient documentation

## 2018-04-02 DIAGNOSIS — G479 Sleep disorder, unspecified: Secondary | ICD-10-CM | POA: Insufficient documentation

## 2018-04-18 ENCOUNTER — Telehealth: Payer: Self-pay | Admitting: *Deleted

## 2018-04-18 ENCOUNTER — Other Ambulatory Visit: Payer: Self-pay | Admitting: Podiatry

## 2018-04-18 ENCOUNTER — Ambulatory Visit: Payer: Medicare HMO | Admitting: Podiatry

## 2018-04-18 ENCOUNTER — Ambulatory Visit (INDEPENDENT_AMBULATORY_CARE_PROVIDER_SITE_OTHER): Payer: Medicare HMO

## 2018-04-18 ENCOUNTER — Encounter: Payer: Self-pay | Admitting: Podiatry

## 2018-04-18 DIAGNOSIS — M722 Plantar fascial fibromatosis: Secondary | ICD-10-CM

## 2018-04-18 DIAGNOSIS — G8929 Other chronic pain: Secondary | ICD-10-CM | POA: Diagnosis not present

## 2018-04-18 DIAGNOSIS — T148XXA Other injury of unspecified body region, initial encounter: Secondary | ICD-10-CM

## 2018-04-18 DIAGNOSIS — M79672 Pain in left foot: Principal | ICD-10-CM

## 2018-04-18 DIAGNOSIS — M79673 Pain in unspecified foot: Secondary | ICD-10-CM

## 2018-04-18 DIAGNOSIS — M79671 Pain in right foot: Secondary | ICD-10-CM

## 2018-04-18 NOTE — Telephone Encounter (Signed)
Orders to Gretta Arab, RN for pre-cert of the [facility] [facility]), faxed to [facility].

## 2018-04-18 NOTE — Progress Notes (Signed)
Subjective: 62 year old female presents the office today for concerns of chronic bilateral foot pain which is been ongoing for the last several years.  She states that she previously did see Dr. Amalia Hailey denies dysuria last year.  She also states that she has had pain to her feet since she was 62 years old.  She denies any recent injury.  She gets pain in the bottom of her heels as well as the top of her foot.  She denies any increase in swelling or redness no recent injury or falls.  No numbness or tingling.  She noticed today that she started getting ingrown toenail to the right big toe, pointing the medial aspect.  Denies any redness or drainage or any swelling or any pus or pain.  She has no other concerns. Denies any systemic complaints such as fevers, chills, nausea, vomiting. No acute changes since last appointment, and no other complaints at this time.   Objective: AAO x3, NAD DP/PT pulses palpable bilaterally, CRT less than 3 seconds There is tenderness palpation of the plantar medial tubercle of the calcaneus and insertion of plantar fashion the left foot.  There is no significant discomfort to the plantar aspect the right heel.  There is no area pinpoint tenderness identified bilaterally today there is no pain on exam today to the dorsal aspect of the foot.  There is no edema, erythema, increase in warmth. Mild incurvation present to the medial aspect the right hallux toenail with tenderness to palpation although minimal.  There is no drainage or pus identified today. No open lesions or pre-ulcerative lesions.  No pain with calf compression, swelling, warmth, erythema  Assessment: Chronic bilateral foot pain, plantar fasciitis  Plan: -All treatment options discussed with the patient including all alternatives, risks, complications.  -Repeat x-rays were obtained and reviewed.  Inferior calcaneal spurring present there is no evidence of acute fracture or stress fracture identified  today. -Given her continuation symptoms as well as chronic foot pain I do recommend an MRI of bilateral feet to rule out partial tear of the plantar fascia or other pathology.  Also will order blood work including ESR, CRP, anti-CCP, rheumatoid factor, ANA, HLA-B27 -The results the MRI and blood work for culture and discuss further treatment options.  Continue with supportive shoes that she just purchased and also discussed new orthotics that she is also wearing. -Patient encouraged to call the office with any questions, concerns, change in symptoms.   Trula Slade DPM

## 2018-04-18 NOTE — Telephone Encounter (Signed)
-----   Message from Trula Slade, DPM sent at 04/18/2018 12:09 PM EDT ----- Can you please order bilateral MRI to rule out plantar fascial tear but also chronic foot pain? Thanks.

## 2018-04-22 ENCOUNTER — Encounter (INDEPENDENT_AMBULATORY_CARE_PROVIDER_SITE_OTHER): Payer: Self-pay | Admitting: Physical Medicine and Rehabilitation

## 2018-04-22 ENCOUNTER — Ambulatory Visit (INDEPENDENT_AMBULATORY_CARE_PROVIDER_SITE_OTHER): Payer: Self-pay

## 2018-04-22 ENCOUNTER — Ambulatory Visit (INDEPENDENT_AMBULATORY_CARE_PROVIDER_SITE_OTHER): Payer: Medicare HMO | Admitting: Physical Medicine and Rehabilitation

## 2018-04-22 VITALS — BP 120/75 | HR 83

## 2018-04-22 DIAGNOSIS — M47816 Spondylosis without myelopathy or radiculopathy, lumbar region: Secondary | ICD-10-CM | POA: Diagnosis not present

## 2018-04-22 LAB — C-REACTIVE PROTEIN: CRP: 4.6 mg/L (ref <8.0)

## 2018-04-22 LAB — SEDIMENTATION RATE: SED RATE: 22 mm/h (ref 0–30)

## 2018-04-22 LAB — HLA-B27 ANTIGEN: HLA-B27 ANTIGEN: POSITIVE — AB

## 2018-04-22 LAB — RHEUMATOID FACTOR

## 2018-04-22 LAB — CYCLIC CITRUL PEPTIDE ANTIBODY, IGG: Cyclic Citrullin Peptide Ab: 46 UNITS — ABNORMAL HIGH

## 2018-04-22 LAB — ANA: Anti Nuclear Antibody(ANA): NEGATIVE

## 2018-04-22 MED ORDER — METHYLPREDNISOLONE ACETATE 80 MG/ML IJ SUSP
40.0000 mg | Freq: Once | INTRAMUSCULAR | Status: AC
  Administered 2018-04-22: 40 mg

## 2018-04-22 NOTE — Patient Instructions (Signed)

## 2018-04-22 NOTE — Progress Notes (Signed)
  .  Numeric Pain Rating Scale and Functional Assessment Average Pain 10   In the last MONTH (on 0-10 scale) has pain interfered with the following?  1. General activity like being  able to carry out your everyday physical activities such as walking, climbing stairs, carrying groceries, or moving a chair?  Rating(6)   +Driver

## 2018-04-23 ENCOUNTER — Telehealth: Payer: Self-pay

## 2018-04-23 DIAGNOSIS — M722 Plantar fascial fibromatosis: Secondary | ICD-10-CM

## 2018-04-23 DIAGNOSIS — M778 Other enthesopathies, not elsewhere classified: Secondary | ICD-10-CM

## 2018-04-23 DIAGNOSIS — M779 Enthesopathy, unspecified: Secondary | ICD-10-CM

## 2018-04-23 MED ORDER — TRIAZOLAM 0.25 MG PO TABS: ORAL_TABLET | ORAL | 0 refills | Status: DC

## 2018-04-23 NOTE — Progress Notes (Signed)
KEYOSHA TIEDT - 62 y.o. female MRN [id number]  Date of birth: [birth year]  Office Visit Note: Visit Date: 04/22/2018 PCP: Sandi Mealy, MD Referred by: Sandi Mealy, MD  Subjective: Chief Complaint  Patient presents with  . Lower Back - Pain   HPI: Mrs. Acrey is a 62 year old female with chronic worsening bilateral low back pain who comes in today for planned radiofrequency ablation of the L5-S1 facet joints bilaterally.  She is failed conservative care including medication management and therapy and time.  She has had updated MRI which is reviewed again below.  She has facet arthropathy at L4-5 and L5-S1 worse at L5-S1 with small listhesis.  She does have a transitional S1 segment.  She has some left more than right pain in general.  She is completed double diagnostic facet blocks with good relief of her symptoms but temporary.  She does have a good history for hopefully getting relief longer-term with radiofrequency ablation.  Her case is somewhat complicated and that she is moving to [place] in the next few months.  We are going to complete the left side today but after looking at the images with these transitional segment I would like to start with the right side first.   ROS Otherwise per HPI.  Assessment & Plan: Visit Diagnoses:  1. Spondylosis without myelopathy or radiculopathy, lumbar region     Plan: No additional findings.   Meds & Orders:  Meds ordered this encounter  Medications  . methylPREDNISolone acetate (DEPO-MEDROL) injection 40 mg    Orders Placed This Encounter  Procedures  . Radiofrequency,Lumbar  . XR C-ARM NO REPORT    Follow-up: Return if symptoms worsen or fail to improve.   Procedures: No procedures performed  Lumbar Facet Joint Nerve Denervation  Patient: LEAANN NEVILS      Date of Birth: 09-08-1956 MRN: [id number] PCP: Sandi Mealy, MD      Visit Date: 04/22/2018   Universal Protocol:    Date/Time: 08/13/196:21  AM  Consent Given By: the patient  Position: PRONE  Additional Comments: Vital signs were monitored before and after the procedure. Patient was prepped and draped in the usual sterile fashion. The correct patient, procedure, and site was verified.   Injection Procedure Details:  Procedure Site One Meds Administered:  Meds ordered this encounter  Medications  . methylPREDNISolone acetate (DEPO-MEDROL) injection 40 mg     Laterality: Right  Location/Site:  L5-S1  Needle size: 18 G  Needle type: Radiofrequency cannula  Needle Placement: Along juncture of superior articular process and transverse pocess  Findings:  -Comments:  Procedure Details: For each desired target nerve, the corresponding transverse process (sacral ala for the L5 dorsal rami) was identified and the fluoroscope was positioned to square off the endplates of the corresponding vertebral body to achieve a true AP midline view.  The beam was then obliqued 15 to 20 degrees and caudally tilted 15 to 20 degrees to line up a trajectory along the target nerves. The skin over the target of the junction of superior articulating process and transverse process (sacral ala for the L5 dorsal rami) was infiltrated with 81ml of 1% Lidocaine without Epinephrine.  The 18 gauge 59mm active tip outer cannula was advanced in trajectory view to the target.  This procedure was repeated for each target nerve.  Then, for all levels, the outer cannula placement was fine-tuned and the position was then confirmed with bi-planar imaging.    Test stimulation was done both  at sensory and motor levels to ensure there was no radicular stimulation. The target tissues were then infiltrated with 1 ml of 1% Lidocaine without Epinephrine. Subsequently, a percutaneous neurotomy was carried out for 60 seconds at 80 degrees Celsius. The procedure was repeated with the cannula rotated 90 degrees, for duration of 60 seconds, one additional time at each  level for a total of two lesions per level.  After the completion of the two lesions, 1 ml of injectate was delivered. It was then repeated for each facet joint nerve mentioned above. Appropriate radiographs were obtained to verify the probe placement during the neurotomy.   Additional Comments:  The patient tolerated the procedure well Dressing: Band-Aid    Post-procedure details: Patient was observed during the procedure. Post-procedure instructions were reviewed.  Patient left the clinic in stable condition.      Clinical History: [facility] LUMBAR SPINE WITHOUT CONTRAST  TECHNIQUE: Multiplanar, multisequence MR imaging of the lumbar spine was performed. No intravenous contrast was administered.  COMPARISON:  09/27/2012 lumbar spine MRI. 10/23/2017 lumbar spine radiographs.  FINDINGS: Segmentation:  Transitional S1 vertebral body.  Alignment:  Physiologic.  Vertebrae:  No fracture, evidence of discitis, or bone lesion.  Conus medullaris and cauda equina: Conus extends to the L1-2 level. Conus and cauda equina appear normal.  Paraspinal and other soft tissues: Negative.  Disc levels:  L1-2: No significant disc displacement, foraminal stenosis, or canal stenosis.  L2-3: No significant disc displacement, foraminal stenosis, or canal stenosis.  L3-4: No significant disc displacement, foraminal stenosis, or canal stenosis.  L4-5: Mild disc bulge and facet hypertrophy. No significant foraminal or canal stenosis.  L5-S1: Mild disc bulge and facet hypertrophy. No significant foraminal or canal stenosis.  IMPRESSION: 1. No acute osseous abnormality or malalignment of the lumbar spine. 2. Stable mild lumbar spondylosis with disc and facet degenerative changes at the L4-5 and L5-S1 levels. No significant foraminal or canal stenosis.   Electronically Signed   By: Kristine Garbe M.D.   On: 02/01/2018 20:03  1. Degenerative disc disease and  lower lumbar spondylosis, without impingement identified.   She reports that she has never smoked. She has never used smokeless tobacco. No results for input(s): HGBA1C, LABURIC in the last 8760 hours.  Objective:  VS:  HT:    WT:   BMI:     BP:120/75  HR:83bpm  TEMP: ( )  RESP:  Physical Exam  Ortho Exam Imaging: Xr C-arm No Report  Result Date: 04/22/2018 Please see Notes tab for imaging impression.   Past Medical/Family/Surgical/Social History: Medications & Allergies reviewed per EMR, new medications updated. Patient Active Problem List   Diagnosis Date Noted  . Plantar fasciitis 04/18/2018  . Dyspnea on exertion 10/16/2016  . Essential hypertension 10/16/2016  . Morbid obesity due to excess calories ([place]) 10/16/2016  . IBS (irritable bowel syndrome) 02/29/2012  . Tibial plateau fracture 05/10/2011  . HELICOBACTER PYLORI INFECTION 08/25/2009  . DEPRESSION 08/25/2009  . ASTHMA 08/25/2009  . GASTROESOPHAGEAL REFLUX DISEASE, CHRONIC 08/25/2009  . ALLERGY 08/25/2009  . CHOLELITHIASIS, HX OF 08/25/2009   Past Medical History:  Diagnosis Date  . Anxiety   . Asthma   . DDD (degenerative disc disease)    chronic back pain  . Fibromyalgia   . Foot pain   . GERD (gastroesophageal reflux disease)   . Hypertension   . IBS (irritable bowel syndrome)   . Mood disorder ([place])   . Venous insufficiency   . Wears glasses  Family History  Problem Relation Age of Onset  . Diabetes Mother   . Coronary artery disease Mother   . COPD Mother   . Colon polyps Mother        age 60  . Lung cancer Father    Past Surgical History:  Procedure Laterality Date  . CARPAL TUNNEL RELEASE Right 06/10/2013   Procedure: CARPAL TUNNEL RELEASE;  Surgeon: Wynonia Sours, MD;  Location: [facility];  Service: Orthopedics;  Laterality: Right;  . COLONOSCOPY  12/20/11   [name]-single diminutive sessile polyp in the sigmoid colon/small interenal hemorrhoids  . COLONOSCOPY N/A  11/28/2017   Procedure: COLONOSCOPY;  Surgeon: Daneil Dolin, MD;  [facility];  Service: Endoscopy;  Laterality: N/A;  10:30  . ESOPHAGOGASTRODUODENOSCOPY  04/10/2003   Normal esophagus/Antral erosions, as described above.  The remainder of the stomach appeared normal.  Normal first and second parts of the duodenum.  . ESOPHAGOGASTRODUODENOSCOPY  12/20/11   [name]-hiatus hernia/chronic gastritis/normal duodenumNEGATIVE H pylori, negative SB biopsy  . KNEE ARTHROPLASTY    . TIBIA FRACTURE SURGERY  2012   right   Social History   Occupational History  . Occupation: unemployed  Tobacco Use  . Smoking status: Never Smoker  . Smokeless tobacco: Never Used  Substance and Sexual Activity  . Alcohol use: Yes    Comment: socially, couple times per week  . Drug use: No    Types: Marijuana    Comment: remote maijuana  . Sexual activity: Never    Birth control/protection: None

## 2018-04-23 NOTE — Procedures (Signed)
Lumbar Facet Joint Nerve Denervation  Patient: Marie Farmer      Date of Birth: 02/14/1956 MRN: [id number] PCP: Sandi Mealy, MD      Visit Date: 04/22/2018   Universal Protocol:    Date/Time: 08/13/196:21 AM  Consent Given By: the patient  Position: PRONE  Additional Comments: Vital signs were monitored before and after the procedure. Patient was prepped and draped in the usual sterile fashion. The correct patient, procedure, and site was verified.   Injection Procedure Details:  Procedure Site One Meds Administered:  Meds ordered this encounter  Medications  . methylPREDNISolone acetate (DEPO-MEDROL) injection 40 mg     Laterality: Right  Location/Site:  L5-S1  Needle size: 18 G  Needle type: Radiofrequency cannula  Needle Placement: Along juncture of superior articular process and transverse pocess  Findings:  -Comments:  Procedure Details: For each desired target nerve, the corresponding transverse process (sacral ala for the L5 dorsal rami) was identified and the fluoroscope was positioned to square off the endplates of the corresponding vertebral body to achieve a true AP midline view.  The beam was then obliqued 15 to 20 degrees and caudally tilted 15 to 20 degrees to line up a trajectory along the target nerves. The skin over the target of the junction of superior articulating process and transverse process (sacral ala for the L5 dorsal rami) was infiltrated with 57ml of 1% Lidocaine without Epinephrine.  The 18 gauge 26mm active tip outer cannula was advanced in trajectory view to the target.  This procedure was repeated for each target nerve.  Then, for all levels, the outer cannula placement was fine-tuned and the position was then confirmed with bi-planar imaging.    Test stimulation was done both at sensory and motor levels to ensure there was no radicular stimulation. The target tissues were then infiltrated with 1 ml of 1% Lidocaine without  Epinephrine. Subsequently, a percutaneous neurotomy was carried out for 60 seconds at 80 degrees Celsius. The procedure was repeated with the cannula rotated 90 degrees, for duration of 60 seconds, one additional time at each level for a total of two lesions per level.  After the completion of the two lesions, 1 ml of injectate was delivered. It was then repeated for each facet joint nerve mentioned above. Appropriate radiographs were obtained to verify the probe placement during the neurotomy.   Additional Comments:  The patient tolerated the procedure well Dressing: Band-Aid    Post-procedure details: Patient was observed during the procedure. Post-procedure instructions were reviewed.  Patient left the clinic in stable condition.

## 2018-04-23 NOTE — Telephone Encounter (Signed)
-----   Message from Trula Slade, DPM sent at 04/23/2018  7:27 AM EDT ----- Janett Billow or Val- can you please let her know that her blood work was positive on 2 of the tests and I wold like to refer her to rheumatology given the chronic foot pain and the positive labs. Thanks.

## 2018-04-23 NOTE — Telephone Encounter (Signed)
Spoke with patient informing her of lab results, due to 2+ results, Dr. Jacqualyn Posey is referring her to rheumatologist.  I informed her to keep her appointment for bilateral MRIs.  Referral to Dr. Brock Bad rheumatologist: [address]  [place], [place] [address]  Phone number [phone] Fax [phone]

## 2018-04-26 ENCOUNTER — Ambulatory Visit (HOSPITAL_COMMUNITY)
Admission: RE | Admit: 2018-04-26 | Discharge: 2018-04-26 | Disposition: A | Discharge status: Home / Self Care | Payer: Medicare HMO | Source: Ambulatory Visit | Attending: Podiatry | Admitting: Podiatry

## 2018-04-26 DIAGNOSIS — S96812A Strain of other specified muscles and tendons at ankle and foot level, left foot, initial encounter: Secondary | ICD-10-CM | POA: Insufficient documentation

## 2018-04-26 DIAGNOSIS — M899 Disorder of bone, unspecified: Secondary | ICD-10-CM | POA: Insufficient documentation

## 2018-04-26 DIAGNOSIS — X58XXXA Exposure to other specified factors, initial encounter: Secondary | ICD-10-CM | POA: Insufficient documentation

## 2018-04-26 DIAGNOSIS — T148XXA Other injury of unspecified body region, initial encounter: Secondary | ICD-10-CM | POA: Diagnosis present

## 2018-04-26 DIAGNOSIS — M722 Plantar fascial fibromatosis: Secondary | ICD-10-CM | POA: Diagnosis not present

## 2018-04-29 ENCOUNTER — Encounter: Payer: Self-pay | Admitting: Podiatry

## 2018-04-29 ENCOUNTER — Telehealth: Payer: Self-pay | Admitting: Podiatry

## 2018-04-29 ENCOUNTER — Ambulatory Visit: Payer: Medicare HMO | Admitting: Podiatry

## 2018-04-29 DIAGNOSIS — G8929 Other chronic pain: Secondary | ICD-10-CM

## 2018-04-29 DIAGNOSIS — M949 Disorder of cartilage, unspecified: Secondary | ICD-10-CM

## 2018-04-29 DIAGNOSIS — M1990 Unspecified osteoarthritis, unspecified site: Secondary | ICD-10-CM

## 2018-04-29 DIAGNOSIS — M722 Plantar fascial fibromatosis: Secondary | ICD-10-CM

## 2018-04-29 DIAGNOSIS — M79673 Pain in unspecified foot: Principal | ICD-10-CM

## 2018-04-29 DIAGNOSIS — T148XXA Other injury of unspecified body region, initial encounter: Secondary | ICD-10-CM

## 2018-04-29 DIAGNOSIS — M899 Disorder of bone, unspecified: Secondary | ICD-10-CM

## 2018-04-29 DIAGNOSIS — M779 Enthesopathy, unspecified: Secondary | ICD-10-CM

## 2018-04-29 NOTE — Patient Instructions (Addendum)
I am going to refer you to physical therapy. We can do this at [facility] in [place].  You have inflammation in the tendons on the outside of your ankle with a small tear on the one on the right (this is from overuse and partial tear). You also have essentially some arthritis forming in your ankle as well.   I am going to follow-up on the rheumatologist appointment for you as well.   Have a great rest of your week!

## 2018-04-29 NOTE — Telephone Encounter (Signed)
She came in today and we disucssed

## 2018-04-29 NOTE — Telephone Encounter (Signed)
Pt wants to know if you received MRI results. Have not heard from anyone about referral for Rheumoid arthritis.

## 2018-04-30 NOTE — Progress Notes (Signed)
Subjective: 62 year old female presents the office today for follow-up evaluation of bilateral foot pain which is been ongoing since she was 62 years old.  She presents today to discuss MRI results as well as lab work.  She also states that she is been having some other issues with her leg and back and because that she is been able to use a wheelchair at times.  No acute changes since her last saw her she has no other new concerns. Denies any systemic complaints such as fevers, chills, nausea, vomiting. No acute changes since last appointment, and no other complaints at this time.   Objective: AAO x3, NAD DP/PT pulses palpable bilaterally, CRT less than 3 seconds To the majority tenderness along appears to be along the plantar fashion on the arch of the foot but overall the plantar fascia appears to be intact.  There is minimal tenderness palpation of the anterior medial aspect of her ankle joint upon palpation.  There is no significant discomfort along the course the peroneal tendons.  There is no significant edema, erythema identified bilaterally there is no other areas of tenderness identified.  No open lesions or pre-ulcerative lesions.  No pain with calf compression, swelling, warmth, erythema  Assessment: Bilateral chronic foot pain  Plan: -All treatment options discussed with the patient including all alternatives, risks, complications.  -I reviewed the blood work with her.  Her anti-CCP as well as HLA-B27 are both positive.  Because of this we will refer to rheumatology and will follow up on this for her as well. -I reviewed the MRI results with her as well.  She has no significant pain of the peroneal tendons we also discussed the inflammation of this area as well as a partial tear as well as osteochondral lesions.  This is probably a chronic issue for her as well.  At this point were instructed in physical therapy until we hear back about her rheumatology appointment.  I told her to continue  supportive shoes and discussed orthotics as well as.  At some point may consider surgical intervention.  She has no pain on the peroneal tendon and her pain is not specifically to the ankle joints are not sure that the surgery can be helpful for her as majority of tenderness is along the arch of the foot. -Patient encouraged to call the office with any questions, concerns, change in symptoms.   Trula Slade DPM

## 2018-05-02 ENCOUNTER — Encounter (INDEPENDENT_AMBULATORY_CARE_PROVIDER_SITE_OTHER): Payer: Self-pay | Admitting: Physical Medicine and Rehabilitation

## 2018-05-02 ENCOUNTER — Ambulatory Visit (INDEPENDENT_AMBULATORY_CARE_PROVIDER_SITE_OTHER): Payer: Self-pay

## 2018-05-02 ENCOUNTER — Ambulatory Visit (INDEPENDENT_AMBULATORY_CARE_PROVIDER_SITE_OTHER): Payer: Medicare HMO | Admitting: Physical Medicine and Rehabilitation

## 2018-05-02 VITALS — BP 126/80 | HR 80

## 2018-05-02 DIAGNOSIS — M47816 Spondylosis without myelopathy or radiculopathy, lumbar region: Secondary | ICD-10-CM

## 2018-05-02 MED ORDER — METHYLPREDNISOLONE ACETATE 80 MG/ML IJ SUSP
40.0000 mg | Freq: Once | INTRAMUSCULAR | Status: AC
  Administered 2018-05-02: 40 mg

## 2018-05-02 NOTE — Telephone Encounter (Signed)
-----   Message from Trula Slade, DPM sent at 05/01/2018 12:55 PM EDT ----- I had put in for a rheumatology consult (I think when you were out) and she came and and has not heard anything. I know it can take some time with them but just wanted to follow up to make sure it was complete. Thanks.

## 2018-05-02 NOTE — Patient Instructions (Signed)

## 2018-05-02 NOTE — Progress Notes (Signed)
 .  Numeric Pain Rating Scale and Functional Assessment Average Pain 10   In the last MONTH (on 0-10 scale) has pain interfered with the following?  1. General activity like being  able to carry out your everyday physical activities such as walking, climbing stairs, carrying groceries, or moving a chair?  Rating(6)   +Driver

## 2018-05-02 NOTE — Telephone Encounter (Signed)
[facility] states pt declined 05/2018 and 06/2018 appts offers, pt states she is moving.

## 2018-05-16 NOTE — Procedures (Signed)
Lumbar Facet Joint Nerve Denervation  Patient: Marie Farmer      Date of Birth: Sep 20, 1955 MRN: [id number] PCP: Loman Brooklyn, FNP      Visit Date: 05/02/2018   Universal Protocol:    Date/Time: 09/05/195:52 AM  Consent Given By: the patient  Position: PRONE  Additional Comments: Vital signs were monitored before and after the procedure. Patient was prepped and draped in the usual sterile fashion. The correct patient, procedure, and site was verified.   Injection Procedure Details:  Procedure Site One Meds Administered:  Meds ordered this encounter  Medications  . methylPREDNISolone acetate (DEPO-MEDROL) injection 40 mg     Laterality: Left  Location/Site:  L5-S1  Needle size: 18 G  Needle type: Radiofrequency cannula  Needle Placement: Along juncture of superior articular process and transverse pocess  Findings:  -Comments:  Procedure Details: For each desired target nerve, the corresponding transverse process (sacral ala for the L5 dorsal rami) was identified and the fluoroscope was positioned to square off the endplates of the corresponding vertebral body to achieve a true AP midline view.  The beam was then obliqued 15 to 20 degrees and caudally tilted 15 to 20 degrees to line up a trajectory along the target nerves. The skin over the target of the junction of superior articulating process and transverse process (sacral ala for the L5 dorsal rami) was infiltrated with 27ml of 1% Lidocaine without Epinephrine.  The 18 gauge 36mm active tip outer cannula was advanced in trajectory view to the target.  This procedure was repeated for each target nerve.  Then, for all levels, the outer cannula placement was fine-tuned and the position was then confirmed with bi-planar imaging.    Test stimulation was done both at sensory and motor levels to ensure there was no radicular stimulation. The target tissues were then infiltrated with 1 ml of 1% Lidocaine without  Epinephrine. Subsequently, a percutaneous neurotomy was carried out for 60 seconds at 80 degrees Celsius. The procedure was repeated with the cannula rotated 90 degrees, for duration of 60 seconds, one additional time at each level for a total of two lesions per level.  After the completion of the two lesions, 1 ml of injectate was delivered. It was then repeated for each facet joint nerve mentioned above. Appropriate radiographs were obtained to verify the probe placement during the neurotomy.   Additional Comments:  The patient tolerated the procedure well Dressing: Band-Aid    Post-procedure details: Patient was observed during the procedure. Post-procedure instructions were reviewed.  Patient left the clinic in stable condition.

## 2018-05-16 NOTE — Progress Notes (Signed)
Marie Farmer - 62 y.o. female MRN [id number]  Date of birth: 02/13/1956  Office Visit Note: Visit Date: 05/02/2018 PCP: Loman Brooklyn, FNP Referred by: Sandi Mealy, MD  Subjective: Chief Complaint  Patient presents with  . Lower Back - Pain   HPI: Ms. Smaltz is a 62 year old female with severe chronic low back pain axial in nature and consistent with L5-S1 facet joint mediated pain.  Exam shows pain with facet joint loading.  Diagnostic blocks were effective.  This is all reviewed in prior notes.  Failed conservative care including medication and physical therapy.  She has had this for a long time.  Does not tolerate medications as well has tried the Loxitane and other medications.  We have completed right-sided ablation and she has noticed some relief with this so we are going to proceed with left-sided ablation.  This is fluoroscopically guided radiofrequency ablation of the left L5-S1 facet joint.   ROS Otherwise per HPI.  Assessment & Plan: Visit Diagnoses:  1. Spondylosis without myelopathy or radiculopathy, lumbar region     Plan: No additional findings.   Meds & Orders:  Meds ordered this encounter  Medications  . methylPREDNISolone acetate (DEPO-MEDROL) injection 40 mg    Orders Placed This Encounter  Procedures  . Radiofrequency,Lumbar  . XR C-ARM NO REPORT    Follow-up: Return if symptoms worsen or fail to improve.   Procedures: No procedures performed  Lumbar Facet Joint Nerve Denervation  Patient: Marie Farmer      Date of Birth: 1955/12/20 MRN: [id number] PCP: Loman Brooklyn, FNP      Visit Date: 05/02/2018   Universal Protocol:    Date/Time: 09/05/195:52 AM  Consent Given By: the patient  Position: PRONE  Additional Comments: Vital signs were monitored before and after the procedure. Patient was prepped and draped in the usual sterile fashion. The correct patient, procedure, and site was verified.   Injection Procedure Details:    Procedure Site One Meds Administered:  Meds ordered this encounter  Medications  . methylPREDNISolone acetate (DEPO-MEDROL) injection 40 mg     Laterality: Left  Location/Site:  L5-S1  Needle size: 18 G  Needle type: Radiofrequency cannula  Needle Placement: Along juncture of superior articular process and transverse pocess  Findings:  -Comments:  Procedure Details: For each desired target nerve, the corresponding transverse process (sacral ala for the L5 dorsal rami) was identified and the fluoroscope was positioned to square off the endplates of the corresponding vertebral body to achieve a true AP midline view.  The beam was then obliqued 15 to 20 degrees and caudally tilted 15 to 20 degrees to line up a trajectory along the target nerves. The skin over the target of the junction of superior articulating process and transverse process (sacral ala for the L5 dorsal rami) was infiltrated with 80ml of 1% Lidocaine without Epinephrine.  The 18 gauge 20mm active tip outer cannula was advanced in trajectory view to the target.  This procedure was repeated for each target nerve.  Then, for all levels, the outer cannula placement was fine-tuned and the position was then confirmed with bi-planar imaging.    Test stimulation was done both at sensory and motor levels to ensure there was no radicular stimulation. The target tissues were then infiltrated with 1 ml of 1% Lidocaine without Epinephrine. Subsequently, a percutaneous neurotomy was carried out for 60 seconds at 80 degrees Celsius. The procedure was repeated with the cannula rotated 90 degrees, for  duration of 60 seconds, one additional time at each level for a total of two lesions per level.  After the completion of the two lesions, 1 ml of injectate was delivered. It was then repeated for each facet joint nerve mentioned above. Appropriate radiographs were obtained to verify the probe placement during the neurotomy.   Additional  Comments:  The patient tolerated the procedure well Dressing: Band-Aid    Post-procedure details: Patient was observed during the procedure. Post-procedure instructions were reviewed.  Patient left the clinic in stable condition.      Clinical History: [facility] LUMBAR SPINE WITHOUT CONTRAST  TECHNIQUE: Multiplanar, multisequence MR imaging of the lumbar spine was performed. No intravenous contrast was administered.  COMPARISON:  09/27/2012 lumbar spine MRI. 10/23/2017 lumbar spine radiographs.  FINDINGS: Segmentation:  Transitional S1 vertebral body.  Alignment:  Physiologic.  Vertebrae:  No fracture, evidence of discitis, or bone lesion.  Conus medullaris and cauda equina: Conus extends to the L1-2 level. Conus and cauda equina appear normal.  Paraspinal and other soft tissues: Negative.  Disc levels:  L1-2: No significant disc displacement, foraminal stenosis, or canal stenosis.  L2-3: No significant disc displacement, foraminal stenosis, or canal stenosis.  L3-4: No significant disc displacement, foraminal stenosis, or canal stenosis.  L4-5: Mild disc bulge and facet hypertrophy. No significant foraminal or canal stenosis.  L5-S1: Mild disc bulge and facet hypertrophy. No significant foraminal or canal stenosis.  IMPRESSION: 1. No acute osseous abnormality or malalignment of the lumbar spine. 2. Stable mild lumbar spondylosis with disc and facet degenerative changes at the L4-5 and L5-S1 levels. No significant foraminal or canal stenosis.   Electronically Signed   By: Kristine Garbe M.D.   On: 02/01/2018 20:03  1. Degenerative disc disease and lower lumbar spondylosis, without impingement identified.   She reports that she has never smoked. She has never used smokeless tobacco. No results for input(s): HGBA1C, LABURIC in the last 8760 hours.  Objective:  VS:  HT:    WT:   BMI:     BP:126/80  HR:80bpm  TEMP: ( )  RESP:   Physical Exam  Ortho Exam Imaging: No results found.  Past Medical/Family/Surgical/Social History: Medications & Allergies reviewed per EMR, new medications updated. Patient Active Problem List   Diagnosis Date Noted  . Plantar fasciitis 04/18/2018  . Dyspnea on exertion 10/16/2016  . Essential hypertension 10/16/2016  . Morbid obesity due to excess calories ([place]) 10/16/2016  . IBS (irritable bowel syndrome) 02/29/2012  . Tibial plateau fracture 05/10/2011  . HELICOBACTER PYLORI INFECTION 08/25/2009  . DEPRESSION 08/25/2009  . ASTHMA 08/25/2009  . GASTROESOPHAGEAL REFLUX DISEASE, CHRONIC 08/25/2009  . ALLERGY 08/25/2009  . CHOLELITHIASIS, HX OF 08/25/2009   Past Medical History:  Diagnosis Date  . Anxiety   . Asthma   . DDD (degenerative disc disease)    chronic back pain  . Fibromyalgia   . Foot pain   . GERD (gastroesophageal reflux disease)   . Hypertension   . IBS (irritable bowel syndrome)   . Mood disorder ([place])   . Venous insufficiency   . Wears glasses    Family History  Problem Relation Age of Onset  . Diabetes Mother   . Coronary artery disease Mother   . COPD Mother   . Colon polyps Mother        age 85  . Lung cancer Father    Past Surgical History:  Procedure Laterality Date  . CARPAL TUNNEL RELEASE Right 06/10/2013   Procedure:  CARPAL TUNNEL RELEASE;  Surgeon: Wynonia Sours, MD;  Location: [place];  Service: Orthopedics;  Laterality: Right;  . COLONOSCOPY  12/20/11   [name]-single diminutive sessile polyp in the sigmoid colon/small interenal hemorrhoids  . COLONOSCOPY N/A 11/28/2017   Procedure: COLONOSCOPY;  Surgeon: Daneil Dolin, MD;  [facility];  Service: Endoscopy;  Laterality: N/A;  10:30  . ESOPHAGOGASTRODUODENOSCOPY  04/10/2003   Normal esophagus/Antral erosions, as described above.  The remainder of the stomach appeared normal.  Normal first and second parts of the duodenum.  . ESOPHAGOGASTRODUODENOSCOPY   12/20/11   [name]-hiatus hernia/chronic gastritis/normal duodenumNEGATIVE H pylori, negative SB biopsy  . KNEE ARTHROPLASTY    . TIBIA FRACTURE SURGERY  2012   right   Social History   Occupational History  . Occupation: unemployed  Tobacco Use  . Smoking status: Never Smoker  . Smokeless tobacco: Never Used  Substance and Sexual Activity  . Alcohol use: Yes    Comment: socially, couple times per week  . Drug use: No    Types: Marijuana    Comment: remote maijuana  . Sexual activity: Never    Birth control/protection: None

## 2018-06-07 ENCOUNTER — Ambulatory Visit (INDEPENDENT_AMBULATORY_CARE_PROVIDER_SITE_OTHER): Payer: Medicare HMO | Admitting: Physical Medicine and Rehabilitation

## 2018-06-07 ENCOUNTER — Encounter (INDEPENDENT_AMBULATORY_CARE_PROVIDER_SITE_OTHER): Payer: Self-pay | Admitting: Physical Medicine and Rehabilitation

## 2018-06-07 ENCOUNTER — Ambulatory Visit (INDEPENDENT_AMBULATORY_CARE_PROVIDER_SITE_OTHER): Payer: Self-pay

## 2018-06-07 VITALS — BP 126/77 | HR 74

## 2018-06-07 DIAGNOSIS — M545 Low back pain, unspecified: Secondary | ICD-10-CM

## 2018-06-07 DIAGNOSIS — M47816 Spondylosis without myelopathy or radiculopathy, lumbar region: Secondary | ICD-10-CM | POA: Diagnosis not present

## 2018-06-07 DIAGNOSIS — M25552 Pain in left hip: Secondary | ICD-10-CM | POA: Diagnosis not present

## 2018-06-07 DIAGNOSIS — G8929 Other chronic pain: Secondary | ICD-10-CM

## 2018-06-07 DIAGNOSIS — M7062 Trochanteric bursitis, left hip: Secondary | ICD-10-CM

## 2018-06-07 NOTE — Progress Notes (Signed)
Marie Farmer - 62 y.o. female MRN [id number]  Date of birth: [birth year]  Office Visit Note: Visit Date: 06/07/2018 PCP: Loman Brooklyn, FNP Referred by: Loman Brooklyn, FNP  Subjective: Chief Complaint  Patient presents with  . Left Hip - Pain   HPI: Marie Farmer is a 62 year old female who comes in today for what was follow-up after radiofrequency ablation of the lower lumbar facet joints.  She is very ecstatic and happy with the results of the ablation.  She states that it has done wonders for her back and she does not use a wheelchair anymore to get around and she is really walking fairly normally at this point and can stand for a good length of time.  She still reports sitting makes it better.  She does report a new finding today which is pain in the left lateral hip over the greater trochanter.  No falls or trauma.  No radicular pain no paresthesias.  No focal weakness.  Pain is been ongoing now for several weeks and she can really notice it now more since her back is feeling better.  She rates her pain on average is a 5 out of 10.  This is intermittent sharp over the left hip area but otherwise dull constant pain of the lower back which is greatly improved.  She has improved her ability to do general activities.   Review of Systems  Constitutional: Negative for chills, fever, malaise/fatigue and weight loss.  HENT: Negative for hearing loss and sinus pain.   Eyes: Negative for blurred vision, double vision and photophobia.  Respiratory: Negative for cough and shortness of breath.   Cardiovascular: Negative for chest pain, palpitations and leg swelling.  Gastrointestinal: Negative for abdominal pain, nausea and vomiting.  Genitourinary: Negative for flank pain.  Musculoskeletal: Positive for back pain and joint pain. Negative for myalgias.  Skin: Negative for itching and rash.  Neurological: Negative for tremors, focal weakness and weakness.  Endo/Heme/Allergies: Negative.     Psychiatric/Behavioral: Negative for depression.  All other systems reviewed and are negative.  Otherwise per HPI.  Assessment & Plan: Visit Diagnoses:  1. Pain in left hip   2. Greater trochanteric bursitis, left   3. Spondylosis without myelopathy or radiculopathy, lumbar region   4. Chronic bilateral low back pain without sciatica     Plan: Findings:  Significantly improved low back pain Duda facet arthropathy.  Successful radiofrequency ablation at this point and she is pretty ecstatic with the results so far.  Now she has a newer finding of left greater trochanteric pain syndrome.  She has pretty exquisite pain to palpation over the left greater trochanter.  We are going to complete a diagnostic and hopefully therapeutic greater trochanteric injection today using fluoroscopic guidance to to body habitus.  Patient's BMI 38.3.  She is still moving to [facility] in the next couple weeks I hope she does well.  We can always find someone there to repeat the ablation or obviously if she were here for any reason we could do that if it is necessary.    Meds & Orders: No orders of the defined types were placed in this encounter.   Orders Placed This Encounter  Procedures  . Large Joint Inj: L greater trochanter  . XR C-ARM NO REPORT    Follow-up: Return if symptoms worsen or fail to improve.   Procedures: Large Joint Inj: L greater trochanter on 06/07/2018 10:34 AM Indications: pain and diagnostic evaluation Details: 22 G  3.5 in needle, fluoroscopy-guided lateral approach  Arthrogram: No  Medications: 4 mL lidocaine 2 %; 80 mg triamcinolone acetonide 40 MG/ML; 4 mL bupivacaine 0.25 % Outcome: tolerated well, no immediate complications  There was excellent flow of contrast outlined the greater trochanteric bursa without vascular uptake. Procedure, treatment alternatives, risks and benefits explained, specific risks discussed. Consent was given by the patient. Immediately prior to  procedure a time out was called to verify the correct patient, procedure, equipment, support staff and site/side marked as required. Patient was prepped and draped in the usual sterile fashion.      No notes on file   Clinical History: [facility]RI LUMBAR SPINE WITHOUT CONTRAST  TECHNIQUE: Multiplanar, multisequence MR imaging of the lumbar spine was performed. No intravenous contrast was administered.  COMPARISON:  09/27/2012 lumbar spine MRI. 10/23/2017 lumbar spine radiographs.  FINDINGS: Segmentation:  Transitional S1 vertebral body.  Alignment:  Physiologic.  Vertebrae:  No fracture, evidence of discitis, or bone lesion.  Conus medullaris and cauda equina: Conus extends to the L1-2 level. Conus and cauda equina appear normal.  Paraspinal and other soft tissues: Negative.  Disc levels:  L1-2: No significant disc displacement, foraminal stenosis, or canal stenosis.  L2-3: No significant disc displacement, foraminal stenosis, or canal stenosis.  L3-4: No significant disc displacement, foraminal stenosis, or canal stenosis.  L4-5: Mild disc bulge and facet hypertrophy. No significant foraminal or canal stenosis.  L5-S1: Mild disc bulge and facet hypertrophy. No significant foraminal or canal stenosis.  IMPRESSION: 1. No acute osseous abnormality or malalignment of the lumbar spine. 2. Stable mild lumbar spondylosis with disc and facet degenerative changes at the L4-5 and L5-S1 levels. No significant foraminal or canal stenosis.   Electronically Signed   By: Kristine Garbe M.D.   On: 02/01/2018 20:03  1. Degenerative disc disease and lower lumbar spondylosis, without impingement identified.   She reports that she has never smoked. She has never used smokeless tobacco. No results for input(s): HGBA1C, LABURIC in the last 8760 hours.  Objective:  VS:  HT:    WT:   BMI:     BP:126/77  HR:74bpm  TEMP: ( )  RESP:98 % Physical Exam   Constitutional: She is oriented to person, place, and time. She appears well-developed and well-nourished.  Eyes: Pupils are equal, round, and reactive to light. Conjunctivae and EOM are normal.  Cardiovascular: Normal rate and intact distal pulses.  Pulmonary/Chest: Effort normal.  Musculoskeletal:  Patient ambulates without aid she has less pain with extension and facet joint loading than before.  She does have exquisite pain over the left greater trochanter.  No groin pain with rotation.  Neurological: She is alert and oriented to person, place, and time. She exhibits normal muscle tone. Coordination normal.  Skin: Skin is warm and dry. No rash noted. No erythema.  Psychiatric: She has a normal mood and affect. Her behavior is normal.  Nursing note and vitals reviewed.   Ortho Exam Imaging: No results found.  Past Medical/Family/Surgical/Social History: Medications & Allergies reviewed per EMR, new medications updated. Patient Active Problem List   Diagnosis Date Noted  . Plantar fasciitis 04/18/2018  . Dyspnea on exertion 10/16/2016  . Essential hypertension 10/16/2016  . Morbid obesity due to excess calories ([place]) 10/16/2016  . IBS (irritable bowel syndrome) 02/29/2012  . Tibial plateau fracture 05/10/2011  . HELICOBACTER PYLORI INFECTION 08/25/2009  . DEPRESSION 08/25/2009  . ASTHMA 08/25/2009  . GASTROESOPHAGEAL REFLUX DISEASE, CHRONIC 08/25/2009  . ALLERGY 08/25/2009  .  CHOLELITHIASIS, HX OF 08/25/2009   Past Medical History:  Diagnosis Date  . Anxiety   . Asthma   . DDD (degenerative disc disease)    chronic back pain  . Fibromyalgia   . Foot pain   . GERD (gastroesophageal reflux disease)   . Hypertension   . IBS (irritable bowel syndrome)   . Mood disorder ([place])   . Venous insufficiency   . Wears glasses    Family History  Problem Relation Age of Onset  . Diabetes Mother   . Coronary artery disease Mother   . COPD Mother   . Colon polyps Mother         age 61  . Lung cancer Father    Past Surgical History:  Procedure Laterality Date  . CARPAL TUNNEL RELEASE Right 06/10/2013   Procedure: CARPAL TUNNEL RELEASE;  Surgeon: Wynonia Sours, MD;  Location: [place];  Service: Orthopedics;  Laterality: Right;  . COLONOSCOPY  12/20/11   [name]-single diminutive sessile polyp in the sigmoid colon/small interenal hemorrhoids  . COLONOSCOPY N/A 11/28/2017   Procedure: COLONOSCOPY;  Surgeon: Daneil Dolin, MD;  [facility];  Service: Endoscopy;  Laterality: N/A;  10:30  . ESOPHAGOGASTRODUODENOSCOPY  04/10/2003   Normal esophagus/Antral erosions, as described above.  The remainder of the stomach appeared normal.  Normal first and second parts of the duodenum.  . ESOPHAGOGASTRODUODENOSCOPY  12/20/11   [name]-hiatus hernia/chronic gastritis/normal duodenumNEGATIVE H pylori, negative SB biopsy  . KNEE ARTHROPLASTY    . TIBIA FRACTURE SURGERY  2012   right   Social History   Occupational History  . Occupation: unemployed  Tobacco Use  . Smoking status: Never Smoker  . Smokeless tobacco: Never Used  Substance and Sexual Activity  . Alcohol use: Yes    Comment: socially, couple times per week  . Drug use: No    Types: Marijuana    Comment: remote maijuana  . Sexual activity: Never    Birth control/protection: None

## 2018-06-07 NOTE — Progress Notes (Signed)
 .  Numeric Pain Rating Scale and Functional Assessment Average Pain 5 Pain Right Now 1 My pain is intermittent, constant and dull Pain is worse with: some activites Pain improves with: rest   In the last MONTH (on 0-10 scale) has pain interfered with the following?  1. General activity like being  able to carry out your everyday physical activities such as walking, climbing stairs, carrying groceries, or moving a chair?  Rating(3)  2. Relation with others like being able to carry out your usual social activities and roles such as  activities at home, at work and in your community. Rating(0)  3. Enjoyment of life such that you have  been bothered by emotional problems such as feeling anxious, depressed or irritable?  Rating(0)

## 2018-06-13 ENCOUNTER — Encounter (INDEPENDENT_AMBULATORY_CARE_PROVIDER_SITE_OTHER): Payer: Self-pay | Admitting: Physical Medicine and Rehabilitation

## 2018-06-13 MED ORDER — BUPIVACAINE HCL 0.25 % IJ SOLN
4.0000 mL | INTRAMUSCULAR | Status: AC | PRN
  Administered 2018-06-07: 4 mL via INTRA_ARTICULAR

## 2018-06-13 MED ORDER — LIDOCAINE HCL 2 % IJ SOLN
4.0000 mL | INTRAMUSCULAR | Status: AC | PRN
  Administered 2018-06-07: 4 mL

## 2018-06-13 MED ORDER — TRIAMCINOLONE ACETONIDE 40 MG/ML IJ SUSP
80.0000 mg | INTRAMUSCULAR | Status: AC | PRN
  Administered 2018-06-07: 80 mg via INTRA_ARTICULAR

## 2018-06-21 ENCOUNTER — Telehealth: Payer: Self-pay | Admitting: Podiatry

## 2018-06-21 NOTE — Telephone Encounter (Signed)
I need my medical records sent to two different doctors as I am now in [place]. Please call me back at [phone] and let me know what I need to do. Thank you.

## 2018-06-24 ENCOUNTER — Telehealth (INDEPENDENT_AMBULATORY_CARE_PROVIDER_SITE_OTHER): Payer: Self-pay | Admitting: *Deleted

## 2018-06-24 NOTE — Telephone Encounter (Signed)
I called the pt back in regards to her medical records. I gave the pt two fax numbers, [phone] or 2) [phone] for them to send a medical records release to [place]. Once received, I will fax requested records back to the doctors office in [place] where pt now lives.

## 2019-05-05 ENCOUNTER — Telehealth: Payer: Self-pay | Admitting: Physical Medicine and Rehabilitation

## 2019-05-05 NOTE — Telephone Encounter (Signed)
Received call from patient that she needs her records. She signed release 04/2018 and records have been ready since then. She never picked them up and needs them mailed to her where she now lives in [place]. I mailed to her new address [address] [place] [phone]

## 2019-05-06 ENCOUNTER — Telehealth: Payer: Self-pay | Admitting: Physical Medicine and Rehabilitation

## 2019-05-06 NOTE — Telephone Encounter (Signed)
RECORDS REFAXED DR [facility] Alfonse Spruce [phone], ORIGINALLY FAXED 07/15/2018

## 2019-05-06 NOTE — Telephone Encounter (Signed)
Made CD of xrays of left hip per faxed request. Placed in mail today.

## 2020-02-03 DIAGNOSIS — M25372 Other instability, left ankle: Secondary | ICD-10-CM | POA: Insufficient documentation

## 2020-03-25 DIAGNOSIS — M25872 Other specified joint disorders, left ankle and foot: Secondary | ICD-10-CM | POA: Insufficient documentation

## 2020-03-25 DIAGNOSIS — M93272 Osteochondritis dissecans, left ankle and joints of left foot: Secondary | ICD-10-CM | POA: Insufficient documentation

## 2021-05-10 ENCOUNTER — Ambulatory Visit: Payer: Medicare HMO | Admitting: Internal Medicine

## 2021-05-10 ENCOUNTER — Other Ambulatory Visit: Payer: Self-pay

## 2021-05-10 ENCOUNTER — Encounter: Payer: Self-pay | Admitting: Internal Medicine

## 2021-05-10 VITALS — BP 103/58 | HR 72 | Temp 97.5°F | Ht 63.0 in | Wt 204.0 lb

## 2021-05-10 DIAGNOSIS — G8929 Other chronic pain: Secondary | ICD-10-CM

## 2021-05-10 DIAGNOSIS — Z8371 Family history of colonic polyps: Secondary | ICD-10-CM

## 2021-05-10 DIAGNOSIS — K219 Gastro-esophageal reflux disease without esophagitis: Secondary | ICD-10-CM

## 2021-05-10 DIAGNOSIS — R1011 Right upper quadrant pain: Secondary | ICD-10-CM

## 2021-05-10 NOTE — Patient Instructions (Signed)
It was good to see you today.  Welcome back to [place]!  We will obtain a gallbladder ultrasound to further evaluate right upper quadrant abdominal pain  Continue omeprazole 20 mg daily as needed  As discussed, due to your family history of colon polyps, we need to go ahead and perform a high risk screening colonoscopy sometime between now and the end of the year.  We will set it up sometime for the fall.   "Family history of colon polyps".  ASA 3/propofol  Further recommendations to follow once gallbladder ultrasound report is available for review.

## 2021-05-10 NOTE — Progress Notes (Signed)
Primary Care Physician:  Glenda Chroman, MD Primary Gastroenterologist:  Dr. Gala Romney  Pre-Procedure History & Physical: HPI:  Marie Farmer is a 65 y.o. female here for further evaluation of intermittent right upper quadrant abdominal pain sometimes after eating sometimes not.  She has a history of a longstanding history of GERD. Interesting that she has moved back and forth from [place] to [place] on occasion she has been living in [place] for several years literally just came back into town this morning.  Had made an appointment yesterday to see me today.  In [place] she was evaluated for reflux and ultimately underwent sounds like antireflux surgery back in 2019.  She states this helped her reflux significantly.  Reflux got much better but she does take Prilosec 20 mg daily for breakthrough symptoms.  No dysphagia.  She has no bowel complaints.  Distant history of colonic adenoma ;negative colonoscopy 2013.  She states since her last colonoscopy (done here by me) her brother who is 53 has been diagnosed with precancerous polyps and gets colonoscopy "every 6 months".  She has not had a colonoscopy since 2013. She tells me she has had some rheumatological issues and is supposed to see a rheumatologist in the near future.  She has not reestablished care with Dr. Woody Seller but is planning to do so in the near future   Past Medical History:  Diagnosis Date   Anxiety    Asthma    DDD (degenerative disc disease)    chronic back pain   Fibromyalgia    Foot pain    GERD (gastroesophageal reflux disease)    Hypertension    IBS (irritable bowel syndrome)    Mood disorder ([facility])    Venous insufficiency    Wears glasses     Past Surgical History:  Procedure Laterality Date   CARPAL TUNNEL RELEASE Right 06/10/2013   Procedure: CARPAL TUNNEL RELEASE;  Surgeon: Wynonia Sours, MD;  Location: [facility];  Service: Orthopedics;  Laterality: Right;   COLONOSCOPY  12/20/11   [name]-single  diminutive sessile polyp in the sigmoid colon/small interenal hemorrhoids   COLONOSCOPY N/A 11/28/2017   Procedure: COLONOSCOPY;  Surgeon: Daneil Dolin, MD;  [facility];  Service: Endoscopy;  Laterality: N/A;  10:30   ESOPHAGOGASTRODUODENOSCOPY  04/10/2003   Normal esophagus/Antral erosions, as described above.  The remainder of the stomach appeared normal.  Normal first and second parts of the duodenum.   ESOPHAGOGASTRODUODENOSCOPY  12/20/11   [name]-hiatus hernia/chronic gastritis/normal duodenumNEGATIVE H pylori, negative SB biopsy   KNEE ARTHROPLASTY     TIBIA FRACTURE SURGERY  2012   right    Prior to Admission medications   Medication Sig Start Date End Date Taking? Authorizing Provider  acetaminophen (TYLENOL) 500 MG tablet Take 500 mg by mouth every 6 (six) hours as needed.   Yes [provider]  Biotin 5000 MCG TABS Take by mouth. 2 tabs daily   Yes [provider]  clotrimazole-betamethasone (LOTRISONE) cream Apply 1 application topically 3 (three) times daily. As needed 10/12/17  Yes [provider]  diclofenac Sodium (VOLTAREN) 1 % GEL Apply topically 4 (four) times daily. As needed   Yes [provider]  estradiol (ESTRACE) 0.1 MG/GM vaginal cream Place 1 Applicatorful vaginally at bedtime.   Yes [provider]  ferrous sulfate 325 (65 FE) MG tablet Take 325 mg by mouth daily with breakfast.   Yes [provider]  folic acid (FOLVITE) 1 MG  tablet Take 1 mg by mouth daily.   Yes [provider]  gabapentin (NEURONTIN) 600 MG tablet Take 300 mg by mouth 3 (three) times daily. 04/18/16  Yes [provider]  levothyroxine (SYNTHROID) 25 MCG tablet Take 25 mcg by mouth daily before breakfast.   Yes [provider]  omeprazole (PRILOSEC OTC) 20 MG tablet Take 20 mg by mouth daily.   Yes [provider]  sulfaDIAZINE 500 MG tablet Take 1,500 mg by mouth 3 (three) times daily.    Yes [provider]  tiZANidine (ZANAFLEX) 4 MG capsule Take 4 mg by mouth in the morning and at bedtime.   Yes [provider]  valsartan-hydrochlorothiazide (DIOVAN-HCT) 160-25 MG tablet TAKE 1 TABLET BY MOUTH DAILY. 02/18/18  Yes Tanda Rockers, MD  Vitamin D, Ergocalciferol, (DRISDOL) 1.25 MG (50000 UNIT) CAPS capsule Take 50,000 Units by mouth every 7 (seven) days.   Yes [provider]    Allergies as of 05/10/2021 - Review Complete 05/10/2021  Allergen Reaction Noted   Adhesive [tape]  05/12/2011   Cymbalta [duloxetine hcl]  06/04/2013   Duloxetine     Latex  05/12/2011   Nabumetone     Azithromycin Rash 02/29/2012    Family History  Problem Relation Age of Onset   Diabetes Mother    Coronary artery disease Mother    COPD Mother    Colon polyps Mother        age 16   Lung cancer Father     Social History   Socioeconomic History   Marital status: Divorced    Spouse name: Not on file   Number of children: 2   Years of education: Not on file   Highest education level: Not on file  Occupational History   Occupation: unemployed  Tobacco Use   Smoking status: Never   Smokeless tobacco: Never  Substance and Sexual Activity   Alcohol use: Yes    Comment: socially, couple times per week   Drug use: No    Types: Marijuana    Comment: remote maijuana   Sexual activity: Never    Birth control/protection: None  Other Topics Concern   Not on file  Social History Narrative   Lives w/ sister & her husband & daughter   Social Determinants of Health   Financial Resource Strain: Not on file  Food Insecurity: Not on file  Transportation Needs: Not on file  Physical Activity: Not on file  Stress: Not on file  Social Connections: Not on file  Intimate Partner Violence: Not on file    Review of Systems: See HPI, otherwise negative ROS  Physical Exam: BP (!) 103/58   Pulse 72   Temp (!) 97.5 F (36.4 C)   Ht '5\' 3"'$  (1.6 m)   Wt 204 lb  (92.5 kg)   BMI 36.14 kg/m  General:   Found her sleeping on the examining table.  Easily woke up.  Appears in no acute distress.   Skin:  Intact without significant lesions or rashes. Lungs:  Clear throughout to auscultation.   No wheezes, crackles, or rhonchi. No acute distress. Heart:  Regular rate and rhythm; no murmurs, clicks, rubs,  or gallops. Abdomen: Non-distended, normal bowel sounds.  Soft and nontender without appreciable mass or hepatosplenomegaly.  Pulses:  Normal pulses noted. Extremities:  Without clubbing or edema.  Impression/Plan: Pleasant 65 year old lady with longstanding GERD.  [place] has [place] of [place] and now is back.  Since this time she recall she has had antireflux surgery with a good result although she likes taking low-dose PPI therapy for perceived breakthrough symptoms.  Apparently she had a good result most part from her antireflux surgery  Intermittent right upper quadrant abdominal pain.  Gallbladder remains in situ.  May well be gallbladder issues at play here.  Further evaluation warranted  Family history of colon polyps now in a first-degree relative.;  This warrants updating her colonoscopy now.   Recommendations:  We will obtain a gallbladder ultrasound to further evaluate right upper quadrant abdominal pain  Continue omeprazole 20 mg daily as needed  As discussed, with a family history of colon polyps, we need to go ahead and perform a high risk screening colonoscopy sometime between now and the end of the year.  We will set it up sometime for the fall.   "Family history of colon polyps".  ASA 3/propofol  Further recommendations to follow once gallbladder ultrasound report is available for review.   Notice: This dictation was prepared with Dragon dictation along with smaller phrase technology. Any transcriptional errors that result from this process are unintentional and may not be corrected upon review.

## 2021-05-17 DIAGNOSIS — M4800 Spinal stenosis, site unspecified: Secondary | ICD-10-CM | POA: Diagnosis not present

## 2021-05-17 DIAGNOSIS — E039 Hypothyroidism, unspecified: Secondary | ICD-10-CM | POA: Diagnosis not present

## 2021-05-17 DIAGNOSIS — M1990 Unspecified osteoarthritis, unspecified site: Secondary | ICD-10-CM | POA: Diagnosis not present

## 2021-05-17 DIAGNOSIS — Z789 Other specified health status: Secondary | ICD-10-CM | POA: Diagnosis not present

## 2021-05-17 DIAGNOSIS — Z299 Encounter for prophylactic measures, unspecified: Secondary | ICD-10-CM | POA: Diagnosis not present

## 2021-05-17 DIAGNOSIS — Z6835 Body mass index (BMI) 35.0-35.9, adult: Secondary | ICD-10-CM | POA: Diagnosis not present

## 2021-05-17 DIAGNOSIS — I10 Essential (primary) hypertension: Secondary | ICD-10-CM | POA: Diagnosis not present

## 2021-05-18 ENCOUNTER — Ambulatory Visit (HOSPITAL_COMMUNITY)
Admission: RE | Admit: 2021-05-18 | Discharge: 2021-05-18 | Disposition: A | Discharge status: Home / Self Care | Payer: Medicare HMO | Source: Ambulatory Visit | Attending: Internal Medicine | Admitting: Internal Medicine

## 2021-05-18 ENCOUNTER — Other Ambulatory Visit: Payer: Self-pay

## 2021-05-18 DIAGNOSIS — R1011 Right upper quadrant pain: Secondary | ICD-10-CM | POA: Diagnosis not present

## 2021-05-18 DIAGNOSIS — G8929 Other chronic pain: Secondary | ICD-10-CM | POA: Diagnosis not present

## 2021-05-18 DIAGNOSIS — K760 Fatty (change of) liver, not elsewhere classified: Secondary | ICD-10-CM | POA: Diagnosis not present

## 2021-05-18 DIAGNOSIS — K8020 Calculus of gallbladder without cholecystitis without obstruction: Secondary | ICD-10-CM | POA: Diagnosis not present

## 2021-05-23 ENCOUNTER — Other Ambulatory Visit: Payer: Self-pay

## 2021-05-23 ENCOUNTER — Other Ambulatory Visit: Payer: Self-pay | Admitting: Internal Medicine

## 2021-05-23 DIAGNOSIS — G8929 Other chronic pain: Secondary | ICD-10-CM

## 2021-05-23 DIAGNOSIS — K8020 Calculus of gallbladder without cholecystitis without obstruction: Secondary | ICD-10-CM

## 2021-05-23 MED ORDER — OMEPRAZOLE 40 MG PO CPDR: 40.0000 mg | DELAYED_RELEASE_CAPSULE | Freq: Every day | ORAL | 3 refills | Status: DC

## 2021-05-23 MED ORDER — PEG 3350-KCL-NA BICARB-NACL 420 G PO SOLR: 4000.0000 mL | ORAL | 0 refills | Status: DC

## 2021-05-24 DIAGNOSIS — M9903 Segmental and somatic dysfunction of lumbar region: Secondary | ICD-10-CM | POA: Diagnosis not present

## 2021-05-24 DIAGNOSIS — M47816 Spondylosis without myelopathy or radiculopathy, lumbar region: Secondary | ICD-10-CM | POA: Diagnosis not present

## 2021-05-24 DIAGNOSIS — M48061 Spinal stenosis, lumbar region without neurogenic claudication: Secondary | ICD-10-CM | POA: Diagnosis not present

## 2021-05-24 DIAGNOSIS — M47814 Spondylosis without myelopathy or radiculopathy, thoracic region: Secondary | ICD-10-CM | POA: Diagnosis not present

## 2021-05-24 DIAGNOSIS — M9904 Segmental and somatic dysfunction of sacral region: Secondary | ICD-10-CM | POA: Diagnosis not present

## 2021-05-24 DIAGNOSIS — M9902 Segmental and somatic dysfunction of thoracic region: Secondary | ICD-10-CM | POA: Diagnosis not present

## 2021-05-26 DIAGNOSIS — M48061 Spinal stenosis, lumbar region without neurogenic claudication: Secondary | ICD-10-CM | POA: Diagnosis not present

## 2021-05-26 DIAGNOSIS — M9902 Segmental and somatic dysfunction of thoracic region: Secondary | ICD-10-CM | POA: Diagnosis not present

## 2021-05-26 DIAGNOSIS — M9904 Segmental and somatic dysfunction of sacral region: Secondary | ICD-10-CM | POA: Diagnosis not present

## 2021-05-26 DIAGNOSIS — M47816 Spondylosis without myelopathy or radiculopathy, lumbar region: Secondary | ICD-10-CM | POA: Diagnosis not present

## 2021-05-26 DIAGNOSIS — M47814 Spondylosis without myelopathy or radiculopathy, thoracic region: Secondary | ICD-10-CM | POA: Diagnosis not present

## 2021-05-26 DIAGNOSIS — M9903 Segmental and somatic dysfunction of lumbar region: Secondary | ICD-10-CM | POA: Diagnosis not present

## 2021-05-28 DIAGNOSIS — M7070 Other bursitis of hip, unspecified hip: Secondary | ICD-10-CM | POA: Diagnosis not present

## 2021-05-28 DIAGNOSIS — M150 Primary generalized (osteo)arthritis: Secondary | ICD-10-CM | POA: Diagnosis not present

## 2021-06-01 DIAGNOSIS — M25552 Pain in left hip: Secondary | ICD-10-CM | POA: Diagnosis not present

## 2021-06-01 DIAGNOSIS — M48061 Spinal stenosis, lumbar region without neurogenic claudication: Secondary | ICD-10-CM | POA: Diagnosis not present

## 2021-06-01 DIAGNOSIS — M47814 Spondylosis without myelopathy or radiculopathy, thoracic region: Secondary | ICD-10-CM | POA: Diagnosis not present

## 2021-06-01 DIAGNOSIS — M47816 Spondylosis without myelopathy or radiculopathy, lumbar region: Secondary | ICD-10-CM | POA: Diagnosis not present

## 2021-06-01 DIAGNOSIS — M545 Low back pain, unspecified: Secondary | ICD-10-CM | POA: Diagnosis not present

## 2021-06-01 DIAGNOSIS — M9903 Segmental and somatic dysfunction of lumbar region: Secondary | ICD-10-CM | POA: Diagnosis not present

## 2021-06-01 DIAGNOSIS — M9902 Segmental and somatic dysfunction of thoracic region: Secondary | ICD-10-CM | POA: Diagnosis not present

## 2021-06-01 DIAGNOSIS — M9904 Segmental and somatic dysfunction of sacral region: Secondary | ICD-10-CM | POA: Diagnosis not present

## 2021-06-01 DIAGNOSIS — M6281 Muscle weakness (generalized): Secondary | ICD-10-CM | POA: Diagnosis not present

## 2021-06-06 DIAGNOSIS — M069 Rheumatoid arthritis, unspecified: Secondary | ICD-10-CM | POA: Diagnosis not present

## 2021-06-06 DIAGNOSIS — Z6835 Body mass index (BMI) 35.0-35.9, adult: Secondary | ICD-10-CM | POA: Diagnosis not present

## 2021-06-06 DIAGNOSIS — M4800 Spinal stenosis, site unspecified: Secondary | ICD-10-CM | POA: Diagnosis not present

## 2021-06-06 DIAGNOSIS — D649 Anemia, unspecified: Secondary | ICD-10-CM | POA: Diagnosis not present

## 2021-06-06 DIAGNOSIS — Z299 Encounter for prophylactic measures, unspecified: Secondary | ICD-10-CM | POA: Diagnosis not present

## 2021-06-06 DIAGNOSIS — R590 Localized enlarged lymph nodes: Secondary | ICD-10-CM | POA: Diagnosis not present

## 2021-06-07 DIAGNOSIS — M545 Low back pain, unspecified: Secondary | ICD-10-CM | POA: Diagnosis not present

## 2021-06-07 DIAGNOSIS — M25552 Pain in left hip: Secondary | ICD-10-CM | POA: Diagnosis not present

## 2021-06-07 DIAGNOSIS — M6281 Muscle weakness (generalized): Secondary | ICD-10-CM | POA: Diagnosis not present

## 2021-06-09 DIAGNOSIS — M6281 Muscle weakness (generalized): Secondary | ICD-10-CM | POA: Diagnosis not present

## 2021-06-09 DIAGNOSIS — M25552 Pain in left hip: Secondary | ICD-10-CM | POA: Diagnosis not present

## 2021-06-09 DIAGNOSIS — M545 Low back pain, unspecified: Secondary | ICD-10-CM | POA: Diagnosis not present

## 2021-06-10 DIAGNOSIS — K219 Gastro-esophageal reflux disease without esophagitis: Secondary | ICD-10-CM | POA: Diagnosis not present

## 2021-06-10 DIAGNOSIS — E669 Obesity, unspecified: Secondary | ICD-10-CM | POA: Diagnosis not present

## 2021-06-10 DIAGNOSIS — I10 Essential (primary) hypertension: Secondary | ICD-10-CM | POA: Diagnosis not present

## 2021-06-14 DIAGNOSIS — M545 Low back pain, unspecified: Secondary | ICD-10-CM | POA: Diagnosis not present

## 2021-06-14 DIAGNOSIS — M6281 Muscle weakness (generalized): Secondary | ICD-10-CM | POA: Diagnosis not present

## 2021-06-14 DIAGNOSIS — M25552 Pain in left hip: Secondary | ICD-10-CM | POA: Diagnosis not present

## 2021-06-16 DIAGNOSIS — M6281 Muscle weakness (generalized): Secondary | ICD-10-CM | POA: Diagnosis not present

## 2021-06-16 DIAGNOSIS — M25552 Pain in left hip: Secondary | ICD-10-CM | POA: Diagnosis not present

## 2021-06-16 DIAGNOSIS — M545 Low back pain, unspecified: Secondary | ICD-10-CM | POA: Diagnosis not present

## 2021-06-21 DIAGNOSIS — M6281 Muscle weakness (generalized): Secondary | ICD-10-CM | POA: Diagnosis not present

## 2021-06-21 DIAGNOSIS — M545 Low back pain, unspecified: Secondary | ICD-10-CM | POA: Diagnosis not present

## 2021-06-21 DIAGNOSIS — M25552 Pain in left hip: Secondary | ICD-10-CM | POA: Diagnosis not present

## 2021-06-23 DIAGNOSIS — M545 Low back pain, unspecified: Secondary | ICD-10-CM | POA: Diagnosis not present

## 2021-06-23 DIAGNOSIS — M25552 Pain in left hip: Secondary | ICD-10-CM | POA: Diagnosis not present

## 2021-06-23 DIAGNOSIS — M6281 Muscle weakness (generalized): Secondary | ICD-10-CM | POA: Diagnosis not present

## 2021-06-27 DIAGNOSIS — Z299 Encounter for prophylactic measures, unspecified: Secondary | ICD-10-CM | POA: Diagnosis not present

## 2021-06-27 DIAGNOSIS — M7070 Other bursitis of hip, unspecified hip: Secondary | ICD-10-CM | POA: Diagnosis not present

## 2021-06-27 DIAGNOSIS — M150 Primary generalized (osteo)arthritis: Secondary | ICD-10-CM | POA: Diagnosis not present

## 2021-06-27 DIAGNOSIS — M069 Rheumatoid arthritis, unspecified: Secondary | ICD-10-CM | POA: Diagnosis not present

## 2021-06-27 DIAGNOSIS — M48061 Spinal stenosis, lumbar region without neurogenic claudication: Secondary | ICD-10-CM | POA: Diagnosis not present

## 2021-06-27 DIAGNOSIS — I10 Essential (primary) hypertension: Secondary | ICD-10-CM | POA: Diagnosis not present

## 2021-06-27 DIAGNOSIS — M25551 Pain in right hip: Secondary | ICD-10-CM | POA: Diagnosis not present

## 2021-06-28 DIAGNOSIS — M25552 Pain in left hip: Secondary | ICD-10-CM | POA: Diagnosis not present

## 2021-06-28 DIAGNOSIS — M545 Low back pain, unspecified: Secondary | ICD-10-CM | POA: Diagnosis not present

## 2021-06-28 DIAGNOSIS — M6281 Muscle weakness (generalized): Secondary | ICD-10-CM | POA: Diagnosis not present

## 2021-06-30 DIAGNOSIS — M6281 Muscle weakness (generalized): Secondary | ICD-10-CM | POA: Diagnosis not present

## 2021-06-30 DIAGNOSIS — M545 Low back pain, unspecified: Secondary | ICD-10-CM | POA: Diagnosis not present

## 2021-06-30 DIAGNOSIS — M25552 Pain in left hip: Secondary | ICD-10-CM | POA: Diagnosis not present

## 2021-07-01 NOTE — Patient Instructions (Signed)
   Your procedure is scheduled on: 07/07/2021  Report to [facility] at    10:30 AM.  Call this number if you have problems the morning of surgery: [phone]   Remember:              Follow Directions on the letter you received from Your Physician's office regarding the Bowel Prep              No Smoking the day of Procedure :   Take these medicines the morning of surgery with A SIP OF WATER: levothyroxine, gabapentin, and omeprazole   Do not wear jewelry, make-up or nail polish.    Do not bring valuables to the hospital.  Contacts, dentures or bridgework may not be worn into surgery.  .   Patients discharged the day of surgery will not be allowed to drive home.     Colonoscopy, Adult, Care After This sheet gives you information about how to care for yourself after your procedure. Your health care provider may also give you more specific instructions. If you have problems or questions, contact your health care provider. What can I expect after the procedure? After the procedure, it is common to have: A small amount of blood in your stool for 24 hours after the procedure. Some gas. Mild abdominal cramping or bloating.  Follow these instructions at home: General instructions  For the first 24 hours after the procedure: Do not drive or use machinery. Do not sign important documents. Do not drink alcohol. Do your regular daily activities at a slower pace than normal. Eat soft, easy-to-digest foods. Rest often. Take over-the-counter or prescription medicines only as told by your health care provider. It is up to you to get the results of your procedure. Ask your health care provider, or the department performing the procedure, when your results will be ready. Relieving cramping and bloating Try walking around when you have cramps or feel bloated. Apply heat to your abdomen as told by your health care provider. Use a heat source that your health care provider recommends, such  as a moist heat pack or a heating pad. Place a towel between your skin and the heat source. Leave the heat on for 20-30 minutes. Remove the heat if your skin turns bright red. This is especially important if you are unable to feel pain, heat, or cold. You may have a greater risk of getting burned. Eating and drinking Drink enough fluid to keep your urine clear or pale yellow. Resume your normal diet as instructed by your health care provider. Avoid heavy or fried foods that are hard to digest. Avoid drinking alcohol for as long as instructed by your health care provider. Contact a health care provider if: You have blood in your stool 2-3 days after the procedure. Get help right away if: You have more than a small spotting of blood in your stool. You pass large blood clots in your stool. Your abdomen is swollen. You have nausea or vomiting. You have a fever. You have increasing abdominal pain that is not relieved with medicine. This information is not intended to replace advice given to you by your health care provider. Make sure you discuss any questions you have with your health care provider. Document Released: 04/11/2004 Document Revised: 05/22/2016 Document Reviewed: 11/09/2015 Elsevier Interactive Patient Education  [employer].

## 2021-07-03 DIAGNOSIS — M25552 Pain in left hip: Secondary | ICD-10-CM | POA: Diagnosis not present

## 2021-07-03 DIAGNOSIS — M545 Low back pain, unspecified: Secondary | ICD-10-CM | POA: Diagnosis not present

## 2021-07-03 DIAGNOSIS — M6281 Muscle weakness (generalized): Secondary | ICD-10-CM | POA: Diagnosis not present

## 2021-07-04 ENCOUNTER — Other Ambulatory Visit: Payer: Self-pay

## 2021-07-04 ENCOUNTER — Encounter (HOSPITAL_COMMUNITY): Payer: Self-pay

## 2021-07-04 ENCOUNTER — Encounter (HOSPITAL_COMMUNITY)
Admission: RE | Admit: 2021-07-04 | Discharge: 2021-07-04 | Disposition: A | Discharge status: Home / Self Care | Payer: Medicare HMO | Source: Ambulatory Visit | Attending: Internal Medicine | Admitting: Internal Medicine

## 2021-07-04 DIAGNOSIS — Z01818 Encounter for other preprocedural examination: Secondary | ICD-10-CM | POA: Insufficient documentation

## 2021-07-04 HISTORY — DX: Other specified postprocedural states: Z98.890

## 2021-07-04 HISTORY — DX: Nausea with vomiting, unspecified: R11.2

## 2021-07-04 LAB — BASIC METABOLIC PANEL
Anion gap: 6 (ref 5–15)
BUN: 11 mg/dL (ref 8–23)
CO2: 29 mmol/L (ref 22–32)
Calcium: 9.2 mg/dL (ref 8.9–10.3)
Chloride: 104 mmol/L (ref 98–111)
Creatinine, Ser: 0.63 mg/dL (ref 0.44–1.00)
GFR, Estimated: >60 mL/min (ref >60)
Glucose, Bld: 65 mg/dL — ABNORMAL LOW (ref 70–99)
Potassium: 3.3 mmol/L — ABNORMAL LOW (ref 3.5–5.1)
Sodium: 139 mmol/L (ref 135–145)

## 2021-07-05 ENCOUNTER — Telehealth: Payer: Self-pay | Admitting: Internal Medicine

## 2021-07-05 ENCOUNTER — Other Ambulatory Visit: Payer: Self-pay | Admitting: Internal Medicine

## 2021-07-05 DIAGNOSIS — M6281 Muscle weakness (generalized): Secondary | ICD-10-CM | POA: Diagnosis not present

## 2021-07-05 DIAGNOSIS — M25552 Pain in left hip: Secondary | ICD-10-CM | POA: Diagnosis not present

## 2021-07-05 DIAGNOSIS — M545 Low back pain, unspecified: Secondary | ICD-10-CM | POA: Diagnosis not present

## 2021-07-05 MED ORDER — POTASSIUM CHLORIDE CRYS ER 20 MEQ PO TBCR: 20.0000 meq | EXTENDED_RELEASE_TABLET | Freq: Two times a day (BID) | ORAL | 0 refills | Status: DC

## 2021-07-05 NOTE — Telephone Encounter (Signed)
Pt is scheduled procedure with Dr Gala Romney on 10/27 and wanted to let the nurse know that she was taking prednisone.

## 2021-07-05 NOTE — Telephone Encounter (Signed)
See result note as well.

## 2021-07-05 NOTE — Telephone Encounter (Signed)
Informed Marie Farmer while pt was on phone it's ok for her to be on Prednisone with procedure.  FYI to Dr. Gala Romney.

## 2021-07-06 DIAGNOSIS — K219 Gastro-esophageal reflux disease without esophagitis: Secondary | ICD-10-CM | POA: Diagnosis not present

## 2021-07-06 DIAGNOSIS — I10 Essential (primary) hypertension: Secondary | ICD-10-CM | POA: Diagnosis not present

## 2021-07-06 DIAGNOSIS — E669 Obesity, unspecified: Secondary | ICD-10-CM | POA: Diagnosis not present

## 2021-07-07 ENCOUNTER — Ambulatory Visit (HOSPITAL_COMMUNITY): Payer: Medicare HMO | Admitting: Anesthesiology

## 2021-07-07 ENCOUNTER — Encounter (HOSPITAL_COMMUNITY)
Admission: RE | Disposition: A | Discharge status: Home / Self Care | Payer: Self-pay | Source: Home / Self Care | Attending: Internal Medicine

## 2021-07-07 ENCOUNTER — Ambulatory Visit (HOSPITAL_COMMUNITY)
Admission: RE | Admit: 2021-07-07 | Discharge: 2021-07-07 | Disposition: A | Discharge status: Home / Self Care | Payer: Medicare HMO | Attending: Internal Medicine | Admitting: Internal Medicine

## 2021-07-07 ENCOUNTER — Encounter (HOSPITAL_COMMUNITY): Payer: Self-pay | Admitting: Internal Medicine

## 2021-07-07 DIAGNOSIS — Z1211 Encounter for screening for malignant neoplasm of colon: Secondary | ICD-10-CM | POA: Insufficient documentation

## 2021-07-07 DIAGNOSIS — Z86010 Personal history of colonic polyps: Secondary | ICD-10-CM

## 2021-07-07 DIAGNOSIS — Z79899 Other long term (current) drug therapy: Secondary | ICD-10-CM | POA: Diagnosis not present

## 2021-07-07 DIAGNOSIS — Z801 Family history of malignant neoplasm of trachea, bronchus and lung: Secondary | ICD-10-CM | POA: Insufficient documentation

## 2021-07-07 DIAGNOSIS — K5730 Diverticulosis of large intestine without perforation or abscess without bleeding: Secondary | ICD-10-CM | POA: Diagnosis not present

## 2021-07-07 DIAGNOSIS — K621 Rectal polyp: Secondary | ICD-10-CM

## 2021-07-07 DIAGNOSIS — D128 Benign neoplasm of rectum: Secondary | ICD-10-CM | POA: Insufficient documentation

## 2021-07-07 DIAGNOSIS — Z8371 Family history of colonic polyps: Secondary | ICD-10-CM | POA: Insufficient documentation

## 2021-07-07 DIAGNOSIS — J45909 Unspecified asthma, uncomplicated: Secondary | ICD-10-CM | POA: Diagnosis not present

## 2021-07-07 DIAGNOSIS — K8020 Calculus of gallbladder without cholecystitis without obstruction: Secondary | ICD-10-CM | POA: Insufficient documentation

## 2021-07-07 HISTORY — PX: COLONOSCOPY WITH PROPOFOL: SHX5780

## 2021-07-07 HISTORY — PX: POLYPECTOMY: SHX5525

## 2021-07-07 SURGERY — COLONOSCOPY WITH PROPOFOL
Anesthesia: General

## 2021-07-07 MED ORDER — ONDANSETRON HCL 4 MG/2ML IJ SOLN
INTRAMUSCULAR | Status: AC
  Filled 2021-07-07: qty 2

## 2021-07-07 MED ORDER — PROPOFOL 10 MG/ML IV BOLUS
INTRAVENOUS | Status: DC | PRN
  Administered 2021-07-07: 100 ug/kg/min via INTRAVENOUS
  Administered 2021-07-07: 100 mg via INTRAVENOUS

## 2021-07-07 MED ORDER — LACTATED RINGERS IV SOLN
INTRAVENOUS | Status: DC
  Administered 2021-07-07: 1000 mL via INTRAVENOUS

## 2021-07-07 MED ORDER — ONDANSETRON HCL 4 MG/2ML IJ SOLN
4.0000 mg | Freq: Once | INTRAMUSCULAR | Status: AC
  Administered 2021-07-07: 4 mg via INTRAVENOUS

## 2021-07-07 NOTE — Discharge Instructions (Addendum)
  Colonoscopy Discharge Instructions  Read the instructions outlined below and refer to this sheet in the next few weeks. These discharge instructions provide you with general information on caring for yourself after you leave the hospital. Your doctor may also give you specific instructions. While your treatment has been planned according to the most current medical practices available, unavoidable complications occasionally occur. If you have any problems or questions after discharge, call Dr. Gala Romney at [phone]. ACTIVITY You may resume your regular activity, but move at a slower pace for the next 24 hours.  Take frequent rest periods for the next 24 hours.  Walking will help get rid of the air and reduce the bloated feeling in your belly (abdomen).  No driving for 24 hours (because of the medicine (anesthesia) used during the test).   Do not sign any important legal documents or operate any machinery for 24 hours (because of the anesthesia used during the test).  NUTRITION Drink plenty of fluids.  You may resume your normal diet as instructed by your doctor.  Begin with a light meal and progress to your normal diet. Heavy or fried foods are harder to digest and may make you feel sick to your stomach (nauseated).  Avoid alcoholic beverages for 24 hours or as instructed.  MEDICATIONS You may resume your normal medications unless your doctor tells you otherwise.  WHAT YOU CAN EXPECT TODAY Some feelings of bloating in the abdomen.  Passage of more gas than usual.  Spotting of blood in your stool or on the toilet paper.  IF YOU HAD POLYPS REMOVED DURING THE COLONOSCOPY: No aspirin products for 7 days or as instructed.  No alcohol for 7 days or as instructed.  Eat a soft diet for the next 24 hours.  FINDING OUT THE RESULTS OF YOUR TEST Not all test results are available during your visit. If your test results are not back during the visit, make an appointment with your caregiver to find out the  results. Do not assume everything is normal if you have not heard from your caregiver or the medical facility. It is important for you to follow up on all of your test results.  SEEK IMMEDIATE MEDICAL ATTENTION IF: You have more than a spotting of blood in your stool.  Your belly is swollen (abdominal distention).  You are nauseated or vomiting.  You have a temperature over 101.  You have abdominal pain or discomfort that is severe or gets worse throughout the day.     1 polyp removed from your rectum today  Polyp and diverticulosis information provided  Further recommendations to follow pending review of pathology report   at patient request, I called her brother at [phone] findings and recommendations   patient should continue taking omeprazole 40 mg daily for GERD.  As discussed previously, patient has symptomatic cholelithiasis and needs to be referred to the general surgeon for consideration of elective cholecystectomy.

## 2021-07-07 NOTE — H&P (Signed)
@LOGO @   Primary Care Physician:  Glenda Chroman, MD Primary Gastroenterologist:  Dr.   Pre-Procedure History & Physical: HPI:  Marie Farmer is a 65 y.o. female here for  surveillance colonoscopy.  Distant history of colonic adenoma.  Patient with intermittent epigastric right upper quadrant abdominal pain belching.  Cholelithiasis on ultrasound.  Reflux symptoms well controlled on increased omeprazole (i.e. 40 mg daily) not having any dyspeptic GERD symptoms.  Denies dysphagia.  Past Medical History:  Diagnosis Date   Anxiety    Asthma    DDD (degenerative disc disease)    chronic back pain   Fibromyalgia    Foot pain    GERD (gastroesophageal reflux disease)    Hypertension    IBS (irritable bowel syndrome)    Mood disorder ([facility])    PONV (postoperative nausea and vomiting)    Venous insufficiency    Wears glasses     Past Surgical History:  Procedure Laterality Date   CARPAL TUNNEL RELEASE Right 06/10/2013   Procedure: CARPAL TUNNEL RELEASE;  Surgeon: Wynonia Sours, MD;  Location: [facility];  Service: Orthopedics;  Laterality: Right;   COLONOSCOPY  12/20/2011   [name]-single diminutive sessile polyp in the sigmoid colon/small interenal hemorrhoids   COLONOSCOPY N/A 11/28/2017   Procedure: COLONOSCOPY;  Surgeon: Daneil Dolin, MD;  [facility];  Service: Endoscopy;  Laterality: N/A;  10:30   ESOPHAGOGASTRODUODENOSCOPY  04/10/2003   Normal esophagus/Antral erosions, as described above.  The remainder of the stomach appeared normal.  Normal first and second parts of the duodenum.   ESOPHAGOGASTRODUODENOSCOPY  12/20/2011   [name]-hiatus hernia/chronic gastritis/normal duodenumNEGATIVE H pylori, negative SB biopsy   TIBIA FRACTURE SURGERY  09/11/2010   right    Prior to Admission medications   Medication Sig Start Date End Date Taking? Authorizing Provider  acetaminophen (TYLENOL) 500 MG tablet Take 500 mg by mouth every 6 (six) hours  as needed for moderate pain.   Yes [provider]  clotrimazole-betamethasone (LOTRISONE) cream Apply 1 application topically daily as needed (pain). 10/12/17  Yes [provider]  diclofenac Sodium (VOLTAREN) 1 % GEL Apply 2 g topically daily as needed (pain).   Yes [provider]  Ergocalciferol (VITAMIN D2 PO) Take 1,250 mcg by mouth daily.   Yes [provider]  estradiol (ESTRACE) 0.1 MG/GM vaginal cream Place 1 Applicatorful vaginally daily as needed (Menopause).   Yes [provider]  ferrous sulfate 325 (65 FE) MG tablet Take 325 mg by mouth daily with breakfast.   Yes [provider]  folic acid (FOLVITE) 1 MG tablet Take 1 mg by mouth daily.   Yes [provider]  gabapentin (NEURONTIN) 600 MG tablet Take 600 mg by mouth 3 (three) times daily. 08/09/20  Yes [provider]  levothyroxine (SYNTHROID) 25 MCG tablet Take 25 mcg by mouth daily before breakfast.   Yes [provider]  methocarbamol (ROBAXIN) 500 MG tablet Take 500 mg by mouth 2 (two) times daily. 06/06/21  Yes [provider]  montelukast (SINGULAIR) 10 MG tablet Take 10 mg by mouth daily. 06/16/21  Yes [provider]  omeprazole (PRILOSEC) 40 MG capsule Take 1 capsule (40 mg total) by mouth daily. 05/23/21 05/23/22 Yes Aniella Wandrey, Cristopher Estimable, MD  polyethylene glycol-electrolytes (TRILYTE) 420 g solution Take 4,000 mLs by mouth as directed. 05/23/21  Yes Sabreen Kitchen, Cristopher Estimable, MD  potassium chloride SA (KLOR-CON) 20 MEQ tablet Take 1 tablet (20 mEq total) by mouth  2 (two) times daily for 2 days. 07/05/21 07/07/21 Yes Jordin Vicencio, Cristopher Estimable, MD  sulfaDIAZINE 500 MG tablet Take 1,500 mg by mouth 2 (two) times daily.   Yes [provider]  tiZANidine (ZANAFLEX) 4 MG capsule Take 4 mg by mouth daily.   Yes [provider]  valsartan-hydrochlorothiazide (DIOVAN-HCT) 160-25 MG tablet TAKE 1 TABLET BY MOUTH DAILY. 02/18/18  Yes Tanda Rockers,  MD    Allergies as of 05/23/2021 - Review Complete 05/10/2021  Allergen Reaction Noted   Adhesive [tape]  05/12/2011   Cymbalta [duloxetine hcl]  06/04/2013   Duloxetine     Latex  05/12/2011   Nabumetone     Azithromycin Rash 02/29/2012    Family History  Problem Relation Age of Onset   Diabetes Mother    Coronary artery disease Mother    COPD Mother    Colon polyps Mother        age 2   Lung cancer Father     Social History   Socioeconomic History   Marital status: Divorced    Spouse name: Not on file   Number of children: 2   Years of education: Not on file   Highest education level: Not on file  Occupational History   Occupation: unemployed  Tobacco Use   Smoking status: Never   Smokeless tobacco: Never  Substance and Sexual Activity   Alcohol use: Yes    Comment: socially, couple times per week   Drug use: No    Types: Marijuana    Comment: remote maijuana   Sexual activity: Never    Birth control/protection: None  Other Topics Concern   Not on file  Social History Narrative   Lives w/ sister & her husband & daughter   Social Determinants of Health   Financial Resource Strain: Not on file  Food Insecurity: Not on file  Transportation Needs: Not on file  Physical Activity: Not on file  Stress: Not on file  Social Connections: Not on file  Intimate Partner Violence: Not on file    Review of Systems: See HPI, otherwise negative ROS  Physical Exam: BP (!) 154/81   Temp 98.5 F (36.9 C) (Oral)   Resp 18   LMP 05/12/2007   SpO2 96%  General:   Alert,  Well-developed, well-nourished, pleasant and cooperative in NAD SNeck:  Supple; no masses or thyromegaly. No significant cervical adenopathy. Lungs:  Clear throughout to auscultation.   No wheezes, crackles, or rhonchi. No acute distress. Heart:  Regular rate and rhythm; no murmurs, clicks, rubs,  or gallops. Abdomen: Non-distended, normal bowel sounds.  Soft and nontender without appreciable  mass or hepatosplenomegaly.  Pulses:  Normal pulses noted. Extremities:  Without clubbing or edema.  Impression/Plan:    65 year old lady with a history colonic adenoma; here for surveillance colonoscopy.  Having intermittent epigastric right upper quadrant symptoms in setting of cholelithiasis most consistent with  stuttering biliary colic.  No need for an EGD at this time.  GERD well controlled.  Recommendations:  I have offered the patient a surveillance colonoscopy today.  The risks, benefits, limitations, alternatives and imponderables have been reviewed with the patient. Questions have been answered. All parties are agreeable.      Elective surgery consultation for cholecystectomy in the near future.   Notice: This dictation was prepared with Dragon dictation along with smaller phrase technology. Any transcriptional errors that result from this process are unintentional and may not be corrected upon review.

## 2021-07-07 NOTE — Op Note (Signed)
[facility] Patient Name: Marie Farmer Procedure Date: 07/07/2021 10:30 AM MRN: [id number] Date of Birth: Feb 14, 1956 Attending MD: Norvel Richards , MD CSN: [id number] Age: 65 Admit Type: Outpatient Procedure:                Colonoscopy Indications:              High risk colon cancer surveillance: Personal                            history of colonic polyps Providers:                Norvel Richards, MD, Janeece Riggers, RN, Nelma Rothman, Technician Referring MD:              Medicines:                Propofol per Anesthesia Complications:            No immediate complications. Estimated Blood Loss:     Estimated blood loss: none. Procedure:                Pre-Anesthesia Assessment:                           - Prior to the procedure, a History and Physical                            was performed, and patient medications and                            allergies were reviewed. The patient's tolerance of                            previous anesthesia was also reviewed. The risks                            and benefits of the procedure and the sedation                            options and risks were discussed with the patient.                            All questions were answered, and informed consent                            was obtained. Prior Anticoagulants: The patient has                            taken no previous anticoagulant or antiplatelet                            agents. ASA Grade Assessment: III - A patient with  severe systemic disease. After reviewing the risks                            and benefits, the patient was deemed in                            satisfactory condition to undergo the procedure.                           After obtaining informed consent, the colonoscope                            was passed under direct vision. Throughout the                            procedure, the patient's  blood pressure, pulse, and                            oxygen saturations were monitored continuously. The                            [id number]-HQ190L ([id number]) scope was introduced through                            the anus and advanced to the the cecum, identified                            by appendiceal orifice and ileocecal valve. The                            ileocecal valve, appendiceal orifice, and rectum                            were photographed. Scope In: 10:54:25 AM Scope Out: 15:40:08 AM Scope Withdrawal Time: 0 hours 16 minutes 33 seconds  Total Procedure Duration: 0 hours 24 minutes 53 seconds  Findings:      The perianal and digital rectal examinations were normal.      Scattered small-mouthed diverticula were found in the sigmoid colon.      A 9 mm polyp was found in the rectum (Distal segment adjacent to the       anorectal junction). The polyp was sessile. The polyp was removed with a       hot snare. Resection and retrieval were complete. Estimated blood loss:       none.      The exam was otherwise without abnormality on direct and retroflexion       views. Impression:               - Diverticulosis in the sigmoid colon.                           - One 9 mm polyp in the rectum, removed with a hot                            snare. Resected and  retrieved.                           - The examination was otherwise normal on direct                            and retroflexion views. Moderate Sedation:      Moderate (conscious) sedation was personally administered by an       anesthesia professional. The following parameters were monitored: oxygen       saturation, heart rate, blood pressure, respiratory rate, EKG, adequacy       of pulmonary ventilation, and response to care. Recommendation:           - Patient has a contact number available for                            emergencies. The signs and symptoms of potential                            delayed complications were  discussed with the                            patient. Return to normal activities tomorrow.                            Written discharge instructions were provided to the                            patient.                           - Resume previous diet.                           - Continue present medications.                           - Repeat colonoscopy date to be determined after                            pending pathology results are reviewed for                            surveillance.                           - Return to GI office in 3 months. patient's GERD                            symptoms are well controlled. She has biliary colic                            symptoms from time to time. She has new diagnosis                            of cholelithiasis. She ought to proceed with  elective surgical consultation as previously                            recommended. Continue omeprazole 40 mg daily. Procedure Code(s):        --- Professional ---                           [phone], Colonoscopy, flexible; with removal of                            tumor(s), polyp(s), or other lesion(s) by snare                            technique Diagnosis Code(s):        --- Professional ---                           Z86.010, Personal history of colonic polyps                           K62.1, Rectal polyp                           K57.30, Diverticulosis of large intestine without                            perforation or abscess without bleeding CPT copyright 2019 [employer]. All rights reserved. The codes documented in this report are preliminary and upon coder review may  be revised to meet current compliance requirements. Cristopher Estimable. Mance Vallejo, MD Norvel Richards, MD 07/07/2021 11:29:05 AM This report has been signed electronically. Number of Addenda: 0

## 2021-07-07 NOTE — Transfer of Care (Signed)
Immediate Anesthesia Transfer of Care Note  Patient: Marie Farmer  Procedure(s) Performed: COLONOSCOPY WITH PROPOFOL POLYPECTOMY  Patient Location: Short Stay  Anesthesia Type:General  Level of Consciousness: awake, alert , oriented and patient cooperative  Airway & Oxygen Therapy: Patient Spontanous Breathing  Post-op Assessment: Report given to RN, Post -op Vital signs reviewed and stable and Patient moving all extremities X 4  Post vital signs: Reviewed and stable  Last Vitals:  Vitals Value Taken Time  BP    Temp    Pulse    Resp    SpO2      Last Pain:  Vitals:   07/07/21 1103  TempSrc:   PainSc: 0-No pain      Patients Stated Pain Goal: 7 (66/06/30 1601)  Complications: No notable events documented.

## 2021-07-07 NOTE — Anesthesia Preprocedure Evaluation (Signed)
Anesthesia Evaluation  Patient identified by MRN, date of birth, ID band Patient awake    Reviewed: Allergy & Precautions, NPO status , Patient's Chart, lab work & pertinent test results  History of Anesthesia Complications (+) PONV and history of anesthetic complications  Airway Mallampati: II  TM Distance: >3 FB Neck ROM: Full    Dental  (+) Dental Advisory Given, Missing Crowns :   Pulmonary asthma ,    Pulmonary exam normal breath sounds clear to auscultation       Cardiovascular hypertension, Pt. on medications Normal cardiovascular exam Rhythm:Regular Rate:Normal     Neuro/Psych PSYCHIATRIC DISORDERS Anxiety Depression  Neuromuscular disease    GI/Hepatic Neg liver ROS, GERD  Medicated,  Endo/Other  negative endocrine ROS  Renal/GU negative Renal ROS  negative genitourinary   Musculoskeletal  (+) Arthritis , Osteoarthritis,  Fibromyalgia -  Abdominal   Peds negative pediatric ROS (+)  Hematology negative hematology ROS (+)   Anesthesia Other Findings   Reproductive/Obstetrics negative OB ROS                             Anesthesia Physical Anesthesia Plan  ASA: 2  Anesthesia Plan: General   Post-op Pain Management:    Induction: Intravenous  PONV Risk Score and Plan: TIVA  Airway Management Planned: Nasal Cannula and Natural Airway  Additional Equipment:   Intra-op Plan:   Post-operative Plan:   Informed Consent: I have reviewed the patients History and Physical, chart, labs and discussed the procedure including the risks, benefits and alternatives for the proposed anesthesia with the patient or authorized representative who has indicated his/her understanding and acceptance.     Dental advisory given  Plan Discussed with: CRNA and Surgeon  Anesthesia Plan Comments:         Anesthesia Quick Evaluation

## 2021-07-07 NOTE — Anesthesia Postprocedure Evaluation (Signed)
Anesthesia Post Note  Patient: Marie Farmer  Procedure(s) Performed: COLONOSCOPY WITH PROPOFOL POLYPECTOMY  Patient location during evaluation: Phase II Anesthesia Type: General Level of consciousness: awake and alert and oriented Pain management: pain level controlled Vital Signs Assessment: post-procedure vital signs reviewed and stable Respiratory status: spontaneous breathing, nonlabored ventilation and respiratory function stable Cardiovascular status: blood pressure returned to baseline and stable Postop Assessment: no apparent nausea or vomiting Anesthetic complications: no   No notable events documented.   Last Vitals:  Vitals:   07/07/21 1028 07/07/21 1124  BP: (!) 154/81 (!) 109/58  Resp: 18 16  Temp: 36.9 C 36.6 C  SpO2: 96% 99%    Last Pain:  Vitals:   07/07/21 1145  TempSrc:   PainSc: 0-No pain                 Brendt Dible C Taimi Towe

## 2021-07-08 LAB — SURGICAL PATHOLOGY

## 2021-07-09 ENCOUNTER — Encounter: Payer: Self-pay | Admitting: Internal Medicine

## 2021-07-09 NOTE — Progress Notes (Signed)
done

## 2021-07-11 ENCOUNTER — Encounter (HOSPITAL_COMMUNITY): Payer: Self-pay | Admitting: Internal Medicine

## 2021-07-12 ENCOUNTER — Other Ambulatory Visit: Payer: Self-pay

## 2021-07-12 ENCOUNTER — Encounter: Payer: Self-pay | Admitting: General Surgery

## 2021-07-12 ENCOUNTER — Ambulatory Visit: Payer: Medicare HMO | Admitting: General Surgery

## 2021-07-12 VITALS — BP 115/75 | HR 85 | Temp 98.7°F | Resp 14 | Ht 63.0 in | Wt 190.0 lb

## 2021-07-12 DIAGNOSIS — M545 Low back pain, unspecified: Secondary | ICD-10-CM | POA: Diagnosis not present

## 2021-07-12 DIAGNOSIS — K8020 Calculus of gallbladder without cholecystitis without obstruction: Secondary | ICD-10-CM

## 2021-07-12 DIAGNOSIS — M25552 Pain in left hip: Secondary | ICD-10-CM | POA: Diagnosis not present

## 2021-07-12 DIAGNOSIS — M6281 Muscle weakness (generalized): Secondary | ICD-10-CM | POA: Diagnosis not present

## 2021-07-12 NOTE — Progress Notes (Signed)
[facility] History and Physical  Reason for Referral: Gallstones  Referring Physician: Dr. Gala Romney   Chief Complaint   New Patient (Initial Visit)     Marie Farmer is a 65 y.o. female.  HPI: Marie Farmer is a 65 yo who has had pain in the epigastric region and radiated pain to her back with associated nausea and 1 episode of vomiting.  She had been having this issue with heavy foods and greasy foods. She had a prior history of a hiatal hernia repair in [place] years back. She has had a recent colonoscopy for anemia that she has had some time now. She was referred by Dr. Gala Romney for her most recent symptoms.   Past Medical History:  Diagnosis Date   Anxiety    Asthma    DDD (degenerative disc disease)    chronic back pain   Fibromyalgia    Foot pain    GERD (gastroesophageal reflux disease)    Hypertension    IBS (irritable bowel syndrome)    Mood disorder (HCC)    PONV (postoperative nausea and vomiting)    Venous insufficiency    Wears glasses     Past Surgical History:  Procedure Laterality Date   CARPAL TUNNEL RELEASE Right 06/10/2013   Procedure: CARPAL TUNNEL RELEASE;  Surgeon: Wynonia Sours, MD;  Location: [place];  Service: Orthopedics;  Laterality: Right;   COLONOSCOPY  12/20/2011   [name]-single diminutive sessile polyp in the sigmoid colon/small interenal hemorrhoids   COLONOSCOPY N/A 11/28/2017   Procedure: COLONOSCOPY;  Surgeon: Daneil Dolin, MD;  [facility];  Service: Endoscopy;  Laterality: N/A;  10:30   COLONOSCOPY WITH PROPOFOL N/A 07/07/2021   Procedure: COLONOSCOPY WITH PROPOFOL;  Surgeon: Daneil Dolin, MD;  Location: [facility];  Service: Endoscopy;  Laterality: N/A;  12:15pm   ESOPHAGOGASTRODUODENOSCOPY  04/10/2003   Normal esophagus/Antral erosions, as described above.  The remainder of the stomach appeared normal.  Normal first and second parts of the duodenum.   ESOPHAGOGASTRODUODENOSCOPY   12/20/2011   [name]-hiatus hernia/chronic gastritis/normal duodenumNEGATIVE H pylori, negative SB biopsy   POLYPECTOMY  07/07/2021   Procedure: POLYPECTOMY;  Surgeon: Daneil Dolin, MD;  [facility];  Service: Endoscopy;;   TIBIA FRACTURE SURGERY  09/11/2010   right    Family History  Problem Relation Age of Onset   Diabetes Mother    Coronary artery disease Mother    COPD Mother    Colon polyps Mother        age 51   Lung cancer Father     Social History   Tobacco Use   Smoking status: Never   Smokeless tobacco: Never  Substance Use Topics   Alcohol use: Yes    Comment: socially, couple times per week   Drug use: No    Types: Marijuana    Comment: remote maijuana    Medications: I have reviewed the patient's current medications. Allergies as of 07/12/2021       Reactions   Ace Inhibitors Other (See Comments)   Shortness of breath   Adhesive [tape]    Peels skin   Cymbalta [duloxetine Hcl]    Hot mouth   Duloxetine    REACTION: UNKNOWN REACTION   Latex    Sensitive skin   Methylprednisolone    Redness all over   Prednisone Other (See Comments)   unknown   Azithromycin Rash   Nabumetone Rash, Other (See Comments)  REACTION: UNKNOWN REACTION Other reaction(s): lips swollen        Medication List        Accurate as of July 12, 2021 10:21 AM. If you have any questions, ask your nurse or doctor.          acetaminophen 500 MG tablet Commonly known as: TYLENOL Take 500 mg by mouth every 6 (six) hours as needed for moderate pain.   clotrimazole-betamethasone cream Commonly known as: LOTRISONE Apply 1 application topically daily as needed (pain).   diclofenac Sodium 1 % Gel Commonly known as: VOLTAREN Apply 2 g topically daily as needed (pain).   estradiol 0.1 MG/GM vaginal cream Commonly known as: ESTRACE Place 1 Applicatorful vaginally daily as needed (Menopause).   ferrous sulfate 325 (65 FE) MG tablet Take 325  mg by mouth daily with breakfast.   folic acid 1 MG tablet Commonly known as: FOLVITE Take 1 mg by mouth daily.   gabapentin 600 MG tablet Commonly known as: NEURONTIN Take 600 mg by mouth 3 (three) times daily.   levothyroxine 25 MCG tablet Commonly known as: SYNTHROID Take 25 mcg by mouth daily before breakfast.   methocarbamol 500 MG tablet Commonly known as: ROBAXIN Take 500 mg by mouth 2 (two) times daily.   montelukast 10 MG tablet Commonly known as: SINGULAIR Take 10 mg by mouth daily.   omeprazole 40 MG capsule Commonly known as: PRILOSEC Take 1 capsule (40 mg total) by mouth daily.   polyethylene glycol-electrolytes 420 g solution Commonly known as: TriLyte Take 4,000 mLs by mouth as directed.   potassium chloride SA 20 MEQ tablet Commonly known as: KLOR-CON Take 1 tablet (20 mEq total) by mouth 2 (two) times daily for 2 days.   sulfaDIAZINE 500 MG tablet Take 1,500 mg by mouth 2 (two) times daily.   tiZANidine 4 MG capsule Commonly known as: ZANAFLEX Take 4 mg by mouth daily.   valsartan-hydrochlorothiazide 160-25 MG tablet Commonly known as: DIOVAN-HCT TAKE 1 TABLET BY MOUTH DAILY.   VITAMIN D2 PO Take 1,250 mcg by mouth daily.         ROS:  A comprehensive review of systems was negative except for: Gastrointestinal: positive for abdominal pain, nausea, and reflux symptoms Musculoskeletal: positive for back pain, neck pain, and joint pain Neurological: positive for headaches  Blood pressure 115/75, pulse 85, temperature 98.7 F (37.1 C), temperature source Other (Comment), resp. rate 14, height 5\' 3"  (1.6 m), weight 190 lb (86.2 kg), last menstrual period 05/12/2007, SpO2 93 %. Physical Exam Vitals reviewed.  Constitutional:      Appearance: Normal appearance.  HENT:     Head: Normocephalic.     Nose: Nose normal.     Mouth/Throat:     Mouth: Mucous membranes are moist.  Eyes:     Extraocular Movements: Extraocular movements intact.   Cardiovascular:     Rate and Rhythm: Normal rate and regular rhythm.  Pulmonary:     Effort: Pulmonary effort is normal.     Breath sounds: Normal breath sounds.  Abdominal:     General: There is no distension.     Palpations: Abdomen is soft.     Tenderness: There is abdominal tenderness in the right upper quadrant.  Musculoskeletal:        General: Normal range of motion.     Cervical back: Normal range of motion.  Skin:    General: Skin is warm.  Neurological:     General: No focal deficit present.  Mental Status: She is alert and oriented to person, place, and time.  Psychiatric:        Mood and Affect: Mood normal.        Behavior: Behavior normal.        Thought Content: Thought content normal.        Judgment: Judgment normal.    Results: CLINICAL DATA:  Right upper quadrant pain   EXAM: ULTRASOUND ABDOMEN LIMITED RIGHT UPPER QUADRANT   COMPARISON:  None.   FINDINGS: Gallbladder:   Gallstones noted measuring up to 18 mm. No wall thickening or sonographic Murphy sign.   Common bile duct:   Diameter: Normal caliber, 4 mm   Liver:   Increased echotexture compatible with fatty infiltration. No focal abnormality or biliary ductal dilatation. Portal vein is patent on color Doppler imaging with normal direction of blood flow towards the liver.   Other: None.   IMPRESSION: Hepatic steatosis.   Cholelithiasis.  No sonographic evidence of acute cholecystitis.     Electronically Signed   By: Rolm Baptise M.D.   On: 05/19/2021 03:49   Assessment & Plan:  KALEIA LONGHI is a 65 y.o. female with gallstone that are becoming symptomatic. Discussed that if she continued to have symptoms after the cholecystectomy then she might need an EGD.   PLAN: I counseled the patient about the indication, risks and benefits of laparoscopic cholecystectomy.  She understands there is a very small chance for bleeding, infection, injury to normal structures (including  common bile duct), conversion to open surgery, persistent symptoms, evolution of postcholecystectomy diarrhea, need for secondary interventions, anesthesia reaction, cardiopulmonary issues and other risks not specifically detailed here. I described the expected recovery, the plan for follow-up and the restrictions during the recovery phase.  All questions were answered.   All questions were answered to the satisfaction of the patient.   Virl Cagey 07/12/2021, 10:21 AM

## 2021-07-12 NOTE — Patient Instructions (Signed)
Cholelithiasis Cholelithiasis is a disease in which gallstones form in the gallbladder. The gallbladder is an organ that stores bile. Bile is a fluid that helps to digest fats. Gallstones begin as small crystals and can slowly grow into stones. They may cause no symptoms until they block the gallbladder duct, or cystic duct, when the gallbladder tightens (contracts) after food is eaten. This can cause pain and is known as a gallbladder attack, or biliary colic. There are two main types of gallstones: Cholesterol stones. These are the most common type of gallstone. These stones are made of hardened cholesterol and are usually yellow-green in color. Cholesterol is a fat-like substance that is made in the liver. Pigment stones. These are dark in color and are made of a red-yellow substance, called bilirubin,that forms when hemoglobin from red blood cells breaks down. What are the causes? This condition may be caused by an imbalance in the different parts that make bile. This can happen if the bile: Has too much bilirubin. This can happen in certain blood diseases, such as sickle cell anemia. Has too much cholesterol. Does not have enough bile salts. These salts help the body absorb and digest fats. In some cases, this condition can also be caused by the gallbladder not emptying completely or often enough. This is common during pregnancy. What increases the risk? The following factors may make you more likely to develop this condition: Being female. Having multiple pregnancies. Health care providers sometimes advise removing diseased gallbladders before future pregnancies. Eating a diet that is heavy in fried foods, fat, and refined carbohydrates, such as white bread and white rice. Being obese. Being older than age 56. Using medicines that contain female hormones (estrogen) for a long time. Losing weight quickly. Having a family history of gallstones. Having certain medical problems, such  as: Diabetes mellitus. Cystic fibrosis. Crohn's disease. Cirrhosis or other long-term (chronic) liver disease. Certain blood diseases, such as sickle cell anemia or leukemia. What are the signs or symptoms? In many cases, having gallstones causes no symptoms. When you have gallstones but do not have symptoms, you have silent gallstones. If a gallstone blocks your bile duct, it can cause a gallbladder attack. The main symptom of a gallbladder attack is sudden pain in the upper right part of the abdomen. The pain: Usually comes at night or after eating. Can last for one hour or more. Can spread to your right shoulder, back, or chest. Can feel like indigestion. This is discomfort, burning, or fullness in your upper abdomen. If the bile duct is blocked for more than a few hours, it can cause an infection or inflammation of your gallbladder (cholecystitis), liver, or pancreas. This can cause: Nausea or vomiting. Bloating. Pain in your abdomen that lasts for 5 hours or longer. Tenderness in your upper abdomen, often in the upper right section and under your rib cage. Fever or chills. Skin or the white parts of your eyes turning yellow (jaundice). This usually happens when a stone has blocked bile from passing through the common bile duct. Dark urine or light-colored stools. How is this diagnosed? This condition may be diagnosed based on: A physical exam. Your medical history. Ultrasound. CT scan. MRI. You may also have other tests, including: Blood tests to check for signs of an infection or inflammation. Cholescintigraphy, or HIDA scan. This is a scan of your gallbladder and bile ducts (biliary system) using non-harmful radioactive material and special cameras that can see the radioactive material. Endoscopic retrograde cholangiopancreatogram. This involves  inserting a small tube with a camera on the end (endoscope) through your mouth to look at bile ducts and check for blockages. How is  this treated? Treatment for this condition depends on the severity of the condition. Silent gallstones do not need treatment. Treatment may be needed if a blockage causes a gallbladder attack or other symptoms. Treatment may include: Home care, if symptoms are not severe. During a simple gallbladder attack, stop eating and drinking for 12-24 hours (except for water and clear liquids). This helps to "cool down" your gallbladder. After 1 or 2 days, you can start to eat a diet of simple or clear foods, such as broths and crackers. You may also need medicines for pain or nausea or both. If you have cholecystitis and an infection, you will need antibiotics. A hospital stay, if needed for pain control or for cholecystitis with severe infection. Cholecystectomy, or surgery to remove your gallbladder. This is the most common treatment if all other treatments have not worked. Medicines to break up gallstones. These are most effective at treating small gallstones. Medicines may be used for up to 6-12 months. Endoscopic retrograde cholangiopancreatogram. A small basket can be attached to the endoscope and used to capture and remove gallstones, mainly those that are in the common bile duct. Follow these instructions at home: Medicines Take over-the-counter and prescription medicines only as told by your health care provider. If you were prescribed an antibiotic medicine, take it as told by your health care provider. Do not stop taking the antibiotic even if you start to feel better. Ask your health care provider if the medicine prescribed to you requires you to avoid driving or using machinery. Eating and drinking Drink enough fluid to keep your urine pale yellow. This is important during a gallbladder attack. Water and clear liquids are preferred. Follow a healthy diet. This includes: Reducing fatty foods, such as fried food and foods high in cholesterol. Reducing refined carbohydrates, such as white bread  and white rice. Eating more fiber. Aim for foods such as almonds, fruit, and beans. Alcohol use If you drink alcohol: Limit how much you use to: 0-1 drink a day for nonpregnant women. 0-2 drinks a day for men. Be aware of how much alcohol is in your drink. In the [place] one drink equals one 12 oz bottle of beer (355 mL), one 5 oz glass of wine (148 mL), or one 1 oz glass of hard liquor (44 mL). General instructions Do not use any products that contain nicotine or tobacco, such as cigarettes, e-cigarettes, and chewing tobacco. If you need help quitting, ask your health care provider. Maintain a healthy weight. Keep all follow-up visits as told by your health care provider. These may include consultations with a surgeon or specialist. This is important. Where to find more information [employer] of Diabetes and Digestive and Kidney Diseases: [email or web address] Contact a health care provider if: You think you have had a gallbladder attack. You have been diagnosed with silent gallstones and you develop pain in your abdomen or indigestion. You begin to have attacks more often. You have dark urine or light-colored stools. Get help right away if: You have pain from a gallbladder attack that lasts for more than 2 hours. You have pain in your abdomen that lasts for more than 5 hours or is getting worse. You have a fever or chills. You have nausea and vomiting that do not go away. You develop jaundice. Summary Cholelithiasis is a disease in which gallstones  form in the gallbladder. This condition may be caused by an imbalance in the different parts that make bile. This can happen if your bile has too much bilirubin or cholesterol, or does not have enough bile salts. Treatment for gallstones depends on the severity of the condition. Silent gallstones do not need treatment. If gallstones cause a gallbladder attack or other symptoms, treatment usually involves not eating or drinking anything.  Treatment may also include pain medicines and antibiotics, and it sometimes includes a hospital stay. Surgery to remove the gallbladder is common if all other treatments have not worked. This information is not intended to replace advice given to you by your health care provider. Make sure you discuss any questions you have with your health care provider.  Minimally Invasive Cholecystectomy Minimally invasive cholecystectomy is surgery to remove the gallbladder. The gallbladder is a pear-shaped organ that lies beneath the liver on the right side of the body. The gallbladder stores bile, which is a fluid that helps the body digest fats. Cholecystectomy is often done to treat inflammation of the gallbladder (cholecystitis). This condition is usually caused by a buildup of gallstones (cholelithiasis) in the gallbladder. Gallstones can block the flow of bile, which can result in inflammation and pain. In severe cases, emergency surgery may be required. This procedure is done though small incisions in the abdomen, instead of one large incision. It is also called laparoscopic surgery. A thin scope with a camera (laparoscope) is inserted through one incision. Then surgical instruments are inserted through the other incisions. In some cases, a minimally invasive surgery may need to be changed to a surgery that is done through a larger incision. This is called open surgery. Tell a health care provider about: Any allergies you have. All medicines you are taking, including vitamins, herbs, eye drops, creams, and over-the-counter medicines. Any problems you or family members have had with anesthetic medicines. Any blood disorders you have. Any surgeries you have had. Any medical conditions you have. Whether you are pregnant or may be pregnant. What are the risks? Generally, this is a safe procedure. However, problems may occur, including: Infection. Bleeding. Allergic reactions to medicines. Damage to nearby  structures or organs. A stone remaining in the common bile duct. The common bile duct carries bile from the gallbladder into the small intestine. A bile leak from the cyst duct that is clipped when your gallbladder is removed. What happens before the procedure? Medicines Ask your health care provider about: Changing or stopping your regular medicines. This is especially important if you are taking diabetes medicines or blood thinners. Taking medicines such as aspirin and ibuprofen. These medicines can thin your blood. Do not take these medicines unless your health care provider tells you to take them. Taking over-the-counter medicines, vitamins, herbs, and supplements. General instructions Let your health care provider know if you develop a cold or an infection before surgery. Plan to have someone take you home from the hospital or clinic. If you will be going home right after the procedure, plan to have someone with you for 24 hours. Ask your health care provider: How your surgery site will be marked. What steps will be taken to help prevent infection. These may include: Removing hair at the surgery site. Washing skin with a germ-killing soap. Taking antibiotic medicine. What happens during the procedure?  An IV will be inserted into one of your veins. You will be given one or both of the following: A medicine to help you relax (sedative).  A medicine to make you fall asleep (general anesthetic). A breathing tube will be placed in your mouth. Your surgeon will make several small incisions in your abdomen. The laparoscope will be inserted through one of the small incisions. The camera on the laparoscope will send images to a monitor in the operating room. This lets your surgeon see inside your abdomen. A gas will be pumped into your abdomen. This will expand your abdomen to give the surgeon more room to perform the surgery. Other tools that are needed for the procedure will be inserted  through the other incisions. The gallbladder will be removed through one of the incisions. Your common bile duct may be examined. If stones are found in the common bile duct, they may be removed. After your gallbladder has been removed, the incisions will be closed with stitches (sutures), staples, or skin glue. Your incisions may be covered with a bandage (dressing). The procedure may vary among health care providers and hospitals. What happens after the procedure? Your blood pressure, heart rate, breathing rate, and blood oxygen level will be monitored until you leave the hospital or clinic. You will be given medicines as needed to control your pain. If you were given a sedative during the procedure, it can affect you for several hours. Do not drive or operate machinery until your health care provider says that it is safe. Summary Minimally invasive cholecystectomy, also called laparoscopic cholecystectomy, is surgery to remove the gallbladder using small incisions. Tell your health care provider about all the medical conditions you have and all the medicines you are taking for those conditions. Before the procedure, follow instructions about eating or drinking restrictions and changing or stopping medicines. If you were given a sedative during the procedure, it can affect you for several hours. Do not drive or operate machinery until your health care provider says that it is safe. This information is not intended to replace advice given to you by your health care provider. Make sure you discuss any questions you have with your health care provider. Document Revised: 06/02/2019 Document Reviewed: 06/02/2019 Elsevier Patient Education  [place] Revised: 07/21/2019 Document Reviewed: 07/21/2019 Elsevier Patient Education  2022 [employer].

## 2021-07-13 NOTE — H&P (Signed)
[facility] History and Physical   Reason for Referral: Gallstones  Referring Physician: Dr. Gala Romney    Chief Complaint   New Patient (Initial Visit)        Marie Farmer is a 65 y.o. female.  HPI: Marie Farmer is a 65 yo who has had pain in the epigastric region and radiated pain to her back with associated nausea and 1 episode of vomiting.  She had been having this issue with heavy foods and greasy foods. She had a prior history of a hiatal hernia repair in [place] years back. She has had a recent colonoscopy for anemia that she has had some time now. She was referred by Dr. Gala Romney for her most recent symptoms.        Past Medical History:  Diagnosis Date   Anxiety     Asthma     DDD (degenerative disc disease)      chronic back pain   Fibromyalgia     Foot pain     GERD (gastroesophageal reflux disease)     Hypertension     IBS (irritable bowel syndrome)     Mood disorder (HCC)     PONV (postoperative nausea and vomiting)     Venous insufficiency     Wears glasses             Past Surgical History:  Procedure Laterality Date   CARPAL TUNNEL RELEASE Right 06/10/2013    Procedure: CARPAL TUNNEL RELEASE;  Surgeon: Marie Sours, MD;  Location: [facility];  Service: Orthopedics;  Laterality: Right;   COLONOSCOPY   12/20/2011    Marie Farmer-single diminutive sessile polyp in the sigmoid colon/small interenal hemorrhoids   COLONOSCOPY N/A 11/28/2017    Procedure: COLONOSCOPY;  Surgeon: Marie Dolin, MD;  [facility];  Service: Endoscopy;  Laterality: N/A;  10:30   COLONOSCOPY WITH PROPOFOL N/A 07/07/2021    Procedure: COLONOSCOPY WITH PROPOFOL;  Surgeon: Marie Dolin, MD;  Location: [facility];  Service: Endoscopy;  Laterality: N/A;  12:15pm   ESOPHAGOGASTRODUODENOSCOPY   04/10/2003    Normal esophagus/Antral erosions, as described above.  The remainder of the stomach appeared normal.  Normal first and second parts of the duodenum.    ESOPHAGOGASTRODUODENOSCOPY   12/20/2011    Marie Farmer([name]-hiatus hernia/chronic gastritis/normal duodenumNEGATIVE H pylori, negative SB biopsy   POLYPECTOMY   07/07/2021    Procedure: POLYPECTOMY;  Surgeon: Marie Dolin, MD;  [facility];  Service: Endoscopy;;   TIBIA FRACTURE SURGERY   09/11/2010    right           Family History  Problem Relation Age of Onset   Diabetes Mother     Coronary artery disease Mother     COPD Mother     Colon polyps Mother          age 36   Lung cancer Father        Social History         Tobacco Use   Smoking status: Never   Smokeless tobacco: Never  Substance Use Topics   Alcohol use: Yes      Comment: socially, couple times per week   Drug use: No      Types: Marijuana      Comment: remote maijuana      Medications: I have reviewed the patient's current medications. Allergies as of 07/12/2021         Reactions    Ace Inhibitors  Other (See Comments)    Shortness of breath    Adhesive [tape]      Peels skin    Cymbalta [duloxetine Hcl]      Hot mouth    Duloxetine      REACTION: UNKNOWN REACTION    Latex      Sensitive skin    Methylprednisolone      Redness all over    Prednisone Other (See Comments)    unknown    Azithromycin Rash    Nabumetone Rash, Other (See Comments)    REACTION: UNKNOWN REACTION Other reaction(s): lips swollen            Medication List           Accurate as of July 12, 2021 10:21 AM. If you have any questions, ask your nurse or doctor.              acetaminophen 500 MG tablet Commonly known as: TYLENOL Take 500 mg by mouth every 6 (six) hours as needed for moderate pain.    clotrimazole-betamethasone cream Commonly known as: LOTRISONE Apply 1 application topically daily as needed (pain).    diclofenac Sodium 1 % Gel Commonly known as: VOLTAREN Apply 2 g topically daily as needed (pain).    estradiol 0.1 MG/GM vaginal cream Commonly known as: ESTRACE Place 1  Applicatorful vaginally daily as needed (Menopause).    ferrous sulfate 325 (65 FE) MG tablet Take 325 mg by mouth daily with breakfast.    folic acid 1 MG tablet Commonly known as: FOLVITE Take 1 mg by mouth daily.    gabapentin 600 MG tablet Commonly known as: NEURONTIN Take 600 mg by mouth 3 (three) times daily.    levothyroxine 25 MCG tablet Commonly known as: SYNTHROID Take 25 mcg by mouth daily before breakfast.    methocarbamol 500 MG tablet Commonly known as: ROBAXIN Take 500 mg by mouth 2 (two) times daily.    montelukast 10 MG tablet Commonly known as: SINGULAIR Take 10 mg by mouth daily.    omeprazole 40 MG capsule Commonly known as: PRILOSEC Take 1 capsule (40 mg total) by mouth daily.    polyethylene glycol-electrolytes 420 g solution Commonly known as: TriLyte Take 4,000 mLs by mouth as directed.    potassium chloride SA 20 MEQ tablet Commonly known as: KLOR-CON Take 1 tablet (20 mEq total) by mouth 2 (two) times daily for 2 days.    sulfaDIAZINE 500 MG tablet Take 1,500 mg by mouth 2 (two) times daily.    tiZANidine 4 MG capsule Commonly known as: ZANAFLEX Take 4 mg by mouth daily.    valsartan-hydrochlorothiazide 160-25 MG tablet Commonly known as: DIOVAN-HCT TAKE 1 TABLET BY MOUTH DAILY.    VITAMIN D2 PO Take 1,250 mcg by mouth daily.               ROS:  A comprehensive review of systems was negative except for: Gastrointestinal: positive for abdominal pain, nausea, and reflux symptoms Musculoskeletal: positive for back pain, neck pain, and joint pain Neurological: positive for headaches   Blood pressure 115/75, pulse 85, temperature 98.7 F (37.1 C), temperature source Other (Comment), resp. rate 14, height 5\' 3"  (1.6 m), weight 190 lb (86.2 kg), last menstrual period 05/12/2007, SpO2 93 %. Physical Exam Vitals reviewed.  Constitutional:      Appearance: Normal appearance.  HENT:     Head: Normocephalic.     Nose: Nose normal.      Mouth/Throat:  Mouth: Mucous membranes are moist.  Eyes:     Extraocular Movements: Extraocular movements intact.  Cardiovascular:     Rate and Rhythm: Normal rate and regular rhythm.  Pulmonary:     Effort: Pulmonary effort is normal.     Breath sounds: Normal breath sounds.  Abdominal:     General: There is no distension.     Palpations: Abdomen is soft.     Tenderness: There is abdominal tenderness in the right upper quadrant.  Musculoskeletal:        General: Normal range of motion.     Cervical back: Normal range of motion.  Skin:    General: Skin is warm.  Neurological:     General: No focal deficit present.     Mental Status: She is alert and oriented to person, place, and time.  Psychiatric:        Mood and Affect: Mood normal.        Behavior: Behavior normal.        Thought Content: Thought content normal.        Judgment: Judgment normal.      Results: CLINICAL DATA:  Right upper quadrant pain   EXAM: ULTRASOUND ABDOMEN LIMITED RIGHT UPPER QUADRANT   COMPARISON:  None.   FINDINGS: Gallbladder:   Gallstones noted measuring up to 18 mm. No wall thickening or sonographic Murphy sign.   Common bile duct:   Diameter: Normal caliber, 4 mm   Liver:   Increased echotexture compatible with fatty infiltration. No focal abnormality or biliary ductal dilatation. Portal vein is patent on color Doppler imaging with normal direction of blood flow towards the liver.   Other: None.   IMPRESSION: Hepatic steatosis.   Cholelithiasis.  No sonographic evidence of acute cholecystitis.     Electronically Signed   By: Rolm Baptise M.D.   On: 05/19/2021 03:49     Assessment & Plan:  TESSA SEABERRY is a 65 y.o. female with gallstone that are becoming symptomatic. Discussed that if she continued to have symptoms after the cholecystectomy then she might need an EGD.    PLAN: I counseled the patient about the indication, risks and benefits of laparoscopic  cholecystectomy.  She understands there is a very small chance for bleeding, infection, injury to normal structures (including common bile duct), conversion to open surgery, persistent symptoms, evolution of postcholecystectomy diarrhea, need for secondary interventions, anesthesia reaction, cardiopulmonary issues and other risks not specifically detailed here. I described the expected recovery, the plan for follow-up and the restrictions during the recovery phase.  All questions were answered.     All questions were answered to the satisfaction of the patient.     Virl Cagey 07/12/2021, 10:21 AM

## 2021-07-18 DIAGNOSIS — M25551 Pain in right hip: Secondary | ICD-10-CM | POA: Diagnosis not present

## 2021-07-18 DIAGNOSIS — S76311A Strain of muscle, fascia and tendon of the posterior muscle group at thigh level, right thigh, initial encounter: Secondary | ICD-10-CM | POA: Diagnosis not present

## 2021-07-18 DIAGNOSIS — D649 Anemia, unspecified: Secondary | ICD-10-CM | POA: Diagnosis not present

## 2021-07-21 DIAGNOSIS — M25552 Pain in left hip: Secondary | ICD-10-CM | POA: Diagnosis not present

## 2021-07-21 DIAGNOSIS — S76311A Strain of muscle, fascia and tendon of the posterior muscle group at thigh level, right thigh, initial encounter: Secondary | ICD-10-CM | POA: Diagnosis not present

## 2021-07-21 DIAGNOSIS — M545 Low back pain, unspecified: Secondary | ICD-10-CM | POA: Diagnosis not present

## 2021-07-21 DIAGNOSIS — M6281 Muscle weakness (generalized): Secondary | ICD-10-CM | POA: Diagnosis not present

## 2021-07-25 DIAGNOSIS — S76311A Strain of muscle, fascia and tendon of the posterior muscle group at thigh level, right thigh, initial encounter: Secondary | ICD-10-CM | POA: Diagnosis not present

## 2021-07-25 DIAGNOSIS — M25552 Pain in left hip: Secondary | ICD-10-CM | POA: Diagnosis not present

## 2021-07-25 DIAGNOSIS — M6281 Muscle weakness (generalized): Secondary | ICD-10-CM | POA: Diagnosis not present

## 2021-07-25 DIAGNOSIS — M545 Low back pain, unspecified: Secondary | ICD-10-CM | POA: Diagnosis not present

## 2021-07-25 DIAGNOSIS — S76311D Strain of muscle, fascia and tendon of the posterior muscle group at thigh level, right thigh, subsequent encounter: Secondary | ICD-10-CM | POA: Diagnosis not present

## 2021-07-28 DIAGNOSIS — M7070 Other bursitis of hip, unspecified hip: Secondary | ICD-10-CM | POA: Diagnosis not present

## 2021-07-28 DIAGNOSIS — M150 Primary generalized (osteo)arthritis: Secondary | ICD-10-CM | POA: Diagnosis not present

## 2021-08-01 DIAGNOSIS — M47814 Spondylosis without myelopathy or radiculopathy, thoracic region: Secondary | ICD-10-CM | POA: Diagnosis not present

## 2021-08-01 DIAGNOSIS — M9903 Segmental and somatic dysfunction of lumbar region: Secondary | ICD-10-CM | POA: Diagnosis not present

## 2021-08-01 DIAGNOSIS — M9904 Segmental and somatic dysfunction of sacral region: Secondary | ICD-10-CM | POA: Diagnosis not present

## 2021-08-01 DIAGNOSIS — S76311D Strain of muscle, fascia and tendon of the posterior muscle group at thigh level, right thigh, subsequent encounter: Secondary | ICD-10-CM | POA: Diagnosis not present

## 2021-08-01 DIAGNOSIS — M6281 Muscle weakness (generalized): Secondary | ICD-10-CM | POA: Diagnosis not present

## 2021-08-01 DIAGNOSIS — M25552 Pain in left hip: Secondary | ICD-10-CM | POA: Diagnosis not present

## 2021-08-01 DIAGNOSIS — M47816 Spondylosis without myelopathy or radiculopathy, lumbar region: Secondary | ICD-10-CM | POA: Diagnosis not present

## 2021-08-01 DIAGNOSIS — M48061 Spinal stenosis, lumbar region without neurogenic claudication: Secondary | ICD-10-CM | POA: Diagnosis not present

## 2021-08-01 DIAGNOSIS — M9902 Segmental and somatic dysfunction of thoracic region: Secondary | ICD-10-CM | POA: Diagnosis not present

## 2021-08-01 DIAGNOSIS — M545 Low back pain, unspecified: Secondary | ICD-10-CM | POA: Diagnosis not present

## 2021-08-02 DIAGNOSIS — K8020 Calculus of gallbladder without cholecystitis without obstruction: Secondary | ICD-10-CM | POA: Diagnosis not present

## 2021-08-02 DIAGNOSIS — D638 Anemia in other chronic diseases classified elsewhere: Secondary | ICD-10-CM | POA: Diagnosis not present

## 2021-08-02 DIAGNOSIS — M069 Rheumatoid arthritis, unspecified: Secondary | ICD-10-CM | POA: Diagnosis not present

## 2021-08-03 DIAGNOSIS — M545 Low back pain, unspecified: Secondary | ICD-10-CM | POA: Diagnosis not present

## 2021-08-03 DIAGNOSIS — M47816 Spondylosis without myelopathy or radiculopathy, lumbar region: Secondary | ICD-10-CM | POA: Diagnosis not present

## 2021-08-03 DIAGNOSIS — M25552 Pain in left hip: Secondary | ICD-10-CM | POA: Diagnosis not present

## 2021-08-03 DIAGNOSIS — M9904 Segmental and somatic dysfunction of sacral region: Secondary | ICD-10-CM | POA: Diagnosis not present

## 2021-08-03 DIAGNOSIS — M48061 Spinal stenosis, lumbar region without neurogenic claudication: Secondary | ICD-10-CM | POA: Diagnosis not present

## 2021-08-03 DIAGNOSIS — M9903 Segmental and somatic dysfunction of lumbar region: Secondary | ICD-10-CM | POA: Diagnosis not present

## 2021-08-03 DIAGNOSIS — M6281 Muscle weakness (generalized): Secondary | ICD-10-CM | POA: Diagnosis not present

## 2021-08-03 DIAGNOSIS — M47814 Spondylosis without myelopathy or radiculopathy, thoracic region: Secondary | ICD-10-CM | POA: Diagnosis not present

## 2021-08-03 DIAGNOSIS — S76311D Strain of muscle, fascia and tendon of the posterior muscle group at thigh level, right thigh, subsequent encounter: Secondary | ICD-10-CM | POA: Diagnosis not present

## 2021-08-03 DIAGNOSIS — M9902 Segmental and somatic dysfunction of thoracic region: Secondary | ICD-10-CM | POA: Diagnosis not present

## 2021-08-08 ENCOUNTER — Telehealth: Payer: Self-pay | Admitting: Physical Medicine and Rehabilitation

## 2021-08-08 DIAGNOSIS — M25552 Pain in left hip: Secondary | ICD-10-CM | POA: Diagnosis not present

## 2021-08-08 DIAGNOSIS — M6281 Muscle weakness (generalized): Secondary | ICD-10-CM | POA: Diagnosis not present

## 2021-08-08 DIAGNOSIS — S76311D Strain of muscle, fascia and tendon of the posterior muscle group at thigh level, right thigh, subsequent encounter: Secondary | ICD-10-CM | POA: Diagnosis not present

## 2021-08-08 DIAGNOSIS — M545 Low back pain, unspecified: Secondary | ICD-10-CM | POA: Diagnosis not present

## 2021-08-08 NOTE — Telephone Encounter (Signed)
Patient called she would like an appointment with Dr.Newton.  

## 2021-08-10 DIAGNOSIS — M545 Low back pain, unspecified: Secondary | ICD-10-CM | POA: Diagnosis not present

## 2021-08-10 DIAGNOSIS — S76311D Strain of muscle, fascia and tendon of the posterior muscle group at thigh level, right thigh, subsequent encounter: Secondary | ICD-10-CM | POA: Diagnosis not present

## 2021-08-10 DIAGNOSIS — M25552 Pain in left hip: Secondary | ICD-10-CM | POA: Diagnosis not present

## 2021-08-10 DIAGNOSIS — M6281 Muscle weakness (generalized): Secondary | ICD-10-CM | POA: Diagnosis not present

## 2021-08-11 DIAGNOSIS — Z299 Encounter for prophylactic measures, unspecified: Secondary | ICD-10-CM | POA: Diagnosis not present

## 2021-08-11 DIAGNOSIS — E041 Nontoxic single thyroid nodule: Secondary | ICD-10-CM | POA: Diagnosis not present

## 2021-08-11 DIAGNOSIS — M48061 Spinal stenosis, lumbar region without neurogenic claudication: Secondary | ICD-10-CM | POA: Diagnosis not present

## 2021-08-11 DIAGNOSIS — M47814 Spondylosis without myelopathy or radiculopathy, thoracic region: Secondary | ICD-10-CM | POA: Diagnosis not present

## 2021-08-11 DIAGNOSIS — M9903 Segmental and somatic dysfunction of lumbar region: Secondary | ICD-10-CM | POA: Diagnosis not present

## 2021-08-11 DIAGNOSIS — I10 Essential (primary) hypertension: Secondary | ICD-10-CM | POA: Diagnosis not present

## 2021-08-11 DIAGNOSIS — M47816 Spondylosis without myelopathy or radiculopathy, lumbar region: Secondary | ICD-10-CM | POA: Diagnosis not present

## 2021-08-11 DIAGNOSIS — M9904 Segmental and somatic dysfunction of sacral region: Secondary | ICD-10-CM | POA: Diagnosis not present

## 2021-08-11 DIAGNOSIS — M069 Rheumatoid arthritis, unspecified: Secondary | ICD-10-CM | POA: Diagnosis not present

## 2021-08-11 DIAGNOSIS — M542 Cervicalgia: Secondary | ICD-10-CM | POA: Diagnosis not present

## 2021-08-11 DIAGNOSIS — M9902 Segmental and somatic dysfunction of thoracic region: Secondary | ICD-10-CM | POA: Diagnosis not present

## 2021-08-12 DIAGNOSIS — E039 Hypothyroidism, unspecified: Secondary | ICD-10-CM | POA: Diagnosis not present

## 2021-08-12 DIAGNOSIS — E7800 Pure hypercholesterolemia, unspecified: Secondary | ICD-10-CM | POA: Diagnosis not present

## 2021-08-15 DIAGNOSIS — L84 Corns and callosities: Secondary | ICD-10-CM | POA: Diagnosis not present

## 2021-08-15 DIAGNOSIS — Z299 Encounter for prophylactic measures, unspecified: Secondary | ICD-10-CM | POA: Diagnosis not present

## 2021-08-15 DIAGNOSIS — S76311D Strain of muscle, fascia and tendon of the posterior muscle group at thigh level, right thigh, subsequent encounter: Secondary | ICD-10-CM | POA: Diagnosis not present

## 2021-08-15 DIAGNOSIS — M79672 Pain in left foot: Secondary | ICD-10-CM | POA: Diagnosis not present

## 2021-08-15 DIAGNOSIS — D492 Neoplasm of unspecified behavior of bone, soft tissue, and skin: Secondary | ICD-10-CM | POA: Diagnosis not present

## 2021-08-15 DIAGNOSIS — M6281 Muscle weakness (generalized): Secondary | ICD-10-CM | POA: Diagnosis not present

## 2021-08-15 DIAGNOSIS — M79671 Pain in right foot: Secondary | ICD-10-CM | POA: Diagnosis not present

## 2021-08-15 DIAGNOSIS — M545 Low back pain, unspecified: Secondary | ICD-10-CM | POA: Diagnosis not present

## 2021-08-15 DIAGNOSIS — M25552 Pain in left hip: Secondary | ICD-10-CM | POA: Diagnosis not present

## 2021-08-16 NOTE — Patient Instructions (Signed)
Marie Farmer  08/16/2021     @PREFPERIOPPHARMACY @   Your procedure is scheduled on  08/19/2021.   Report to [facility] at  [phone]  A.M.   Call this number if you have problems the morning of surgery:  [phone]   Remember:  Do not eat or drink after midnight.     Use your inhaler before your       Take these medicines the morning of surgery with A SIP OF WATER         gabapentin, levothyroxine, robaxin, singulair, prilosec.     Do not wear jewelry, make-up or nail polish.  Do not wear lotions, powders, or perfumes, or deodorant.  Do not shave 48 hours prior to surgery.  Men may shave face and neck.  Do not bring valuables to the hospital.  [facility] is not responsible for any belongings or valuables.  Contacts, dentures or bridgework may not be worn into surgery.  Leave your suitcase in the car.  After surgery it may be brought to your room.  For patients admitted to the hospital, discharge time will be determined by your treatment team.  Patients discharged the day of surgery will not be allowed to drive home and must have someone with them for 24 hours.    Special instructions:   DO NOT smoke tobacco or vape for 24 hours before your procedure.  Please read over the following fact sheets that you were given. Coughing and Deep Breathing, Surgical Site Infection Prevention, Anesthesia Post-op Instructions, and Care and Recovery After Surgery      Minimally Invasive Cholecystectomy, Care After The following information offers guidance on how to care for yourself after your procedure. Your health care provider may also give you more specific instructions. If you have problems or questions, contact your health care provider. What can I expect after the procedure? After the procedure, it is common to have: Pain at your incision sites. You will be given medicines to control this pain. Mild nausea or vomiting. Bloating and possible shoulder pain from the  gas that was used during the procedure. Follow these instructions at home: Medicines Take over-the-counter and prescription medicines only as told by your health care provider. If you were prescribed an antibiotic medicine, take it as told by your health care provider. Do not stop using the antibiotic even if you start to feel better. Ask your health care provider if the medicine prescribed to you: Requires you to avoid driving or using machinery. Can cause constipation. You may need to take these actions to prevent or treat constipation: Drink enough fluid to keep your urine pale yellow. Take over-the-counter or prescription medicines. Eat foods that are high in fiber, such as beans, whole grains, and fresh fruits and vegetables. Limit foods that are high in fat and processed sugars, such as fried or sweet foods. Incision care  Follow instructions from your health care provider about how to take care of your incisions. Make sure you: Wash your hands with soap and water for at least 20 seconds before and after you change your bandage (dressing). If soap and water are not available, use hand sanitizer. Change your dressing as told by your health care provider. Leave stitches (sutures), skin glue, or adhesive strips in place. These skin closures may need to be in place for 2 weeks or longer. If adhesive strip edges start to loosen and curl up, you may trim the loose edges.  Do not remove adhesive strips completely unless your health care provider tells you to do that. Do not take baths, swim, or use a hot tub until your health care provider approves. Ask your health care provider if you may take showers. You may only be allowed to take sponge baths. Check your incision area every day for signs of infection. Check for: More redness, swelling, or pain. Fluid or blood. Warmth. Pus or a bad smell. Activity Rest as told by your health care provider. Do not do activities that require a lot of  effort. Avoid sitting for a long time without moving. Get up to take short walks every 1-2 hours. This is important to improve blood flow and breathing. Ask for help if you feel weak or unsteady. Do not lift anything that is heavier than 10 lb (4.5 kg), or the limit that you are told, until your health care provider says that it is safe. Do not play contact sports until your health care provider approves. Do not return to work or school until your health care provider approves. Return to your normal activities as told by your health care provider. Ask your health care provider what activities are safe for you. General instructions If you were given a sedative during the procedure, it can affect you for several hours. Do not drive or operate machinery until your health care provider says that it is safe. Keep all follow-up visits. This is important. Contact a health care provider if: You develop a rash. You have more redness, swelling, or pain around your incisions. You have fluid or blood coming from your incisions. Your incisions feel warm to the touch. You have pus or a bad smell coming from your incisions. You have a fever. One or more of your incisions breaks open. Get help right away if: You have trouble breathing. You have chest pain. You have more pain in your shoulders. You faint or feel dizzy when you stand. You have severe pain in your abdomen. You have nausea or vomiting that lasts for more than one day. You have leg pain that is new or unusual, or if it is localized to one specific spot. These symptoms may represent a serious problem that is an emergency. Do not wait to see if the symptoms will go away. Get medical help right away. Call your local emergency services (911 in the [place] Do not drive yourself to the hospital. Summary After your procedure, it is common to have pain at the incision sites. You may also have nausea or bloating. Follow your health care provider's  instructions about medicine, activity restrictions, and caring for your incision areas. Do not do activities that require a lot of effort. Contact a health care provider if you have a fever or other signs of infection, such as more redness, swelling, or pain around the incisions. Get help right away if you have chest pain, increasing pain in the shoulders, or trouble breathing. This information is not intended to replace advice given to you by your health care provider. Make sure you discuss any questions you have with your health care provider. Document Revised: 03/01/2021 Document Reviewed: 03/01/2021 Elsevier Patient Education  [place] Anesthesia, Adult, Care After This sheet gives you information about how to care for yourself after your procedure. Your health care provider may also give you more specific instructions. If you have problems or questions, contact your health care provider. What can I expect after the procedure? After the procedure, the  following side effects are common: Pain or discomfort at the IV site. Nausea. Vomiting. Sore throat. Trouble concentrating. Feeling cold or chills. Feeling weak or tired. Sleepiness and fatigue. Soreness and body aches. These side effects can affect parts of the body that were not involved in surgery. Follow these instructions at home: For the time period you were told by your health care provider:  Rest. Do not participate in activities where you could fall or become injured. Do not drive or use machinery. Do not drink alcohol. Do not take sleeping pills or medicines that cause drowsiness. Do not make important decisions or sign legal documents. Do not take care of children on your own. Eating and drinking Follow any instructions from your health care provider about eating or drinking restrictions. When you feel hungry, start by eating small amounts of foods that are soft and easy to digest (bland), such as toast.  Gradually return to your regular diet. Drink enough fluid to keep your urine pale yellow. If you vomit, rehydrate by drinking water, juice, or clear broth. General instructions If you have sleep apnea, surgery and certain medicines can increase your risk for breathing problems. Follow instructions from your health care provider about wearing your sleep device: Anytime you are sleeping, including during daytime naps. While taking prescription pain medicines, sleeping medicines, or medicines that make you drowsy. Have a responsible adult stay with you for the time you are told. It is important to have someone help care for you until you are awake and alert. Return to your normal activities as told by your health care provider. Ask your health care provider what activities are safe for you. Take over-the-counter and prescription medicines only as told by your health care provider. If you smoke, do not smoke without supervision. Keep all follow-up visits as told by your health care provider. This is important. Contact a health care provider if: You have nausea or vomiting that does not get better with medicine. You cannot eat or drink without vomiting. You have pain that does not get better with medicine. You are unable to pass urine. You develop a skin rash. You have a fever. You have redness around your IV site that gets worse. Get help right away if: You have difficulty breathing. You have chest pain. You have blood in your urine or stool, or you vomit blood. Summary After the procedure, it is common to have a sore throat or nausea. It is also common to feel tired. Have a responsible adult stay with you for the time you are told. It is important to have someone help care for you until you are awake and alert. When you feel hungry, start by eating small amounts of foods that are soft and easy to digest (bland), such as toast. Gradually return to your regular diet. Drink enough fluid to keep  your urine pale yellow. Return to your normal activities as told by your health care provider. Ask your health care provider what activities are safe for you. This information is not intended to replace advice given to you by your health care provider. Make sure you discuss any questions you have with your health care provider. Document Revised: 05/13/2020 Document Reviewed: 12/11/2019 Elsevier Patient Education  2022 [place]. How to Use Chlorhexidine for Bathing Chlorhexidine gluconate (CHG) is a germ-killing (antiseptic) solution that is used to clean the skin. It can get rid of the bacteria that normally live on the skin and can keep them away for about 24  hours. To clean your skin with CHG, you may be given: A CHG solution to use in the shower or as part of a sponge bath. A prepackaged cloth that contains CHG. Cleaning your skin with CHG may help lower the risk for infection: While you are staying in the intensive care unit of the hospital. If you have a vascular access, such as a central line, to provide short-term or long-term access to your veins. If you have a catheter to drain urine from your bladder. If you are on a ventilator. A ventilator is a machine that helps you breathe by moving air in and out of your lungs. After surgery. What are the risks? Risks of using CHG include: A skin reaction. Hearing loss, if CHG gets in your ears and you have a perforated eardrum. Eye injury, if CHG gets in your eyes and is not rinsed out. The CHG product catching fire. Make sure that you avoid smoking and flames after applying CHG to your skin. Do not use CHG: If you have a chlorhexidine allergy or have previously reacted to chlorhexidine. On babies younger than 52 months of age. How to use CHG solution Use CHG only as told by your health care provider, and follow the instructions on the label. Use the full amount of CHG as directed. Usually, this is one bottle. During a shower Follow  these steps when using CHG solution during a shower (unless your health care provider gives you different instructions): Start the shower. Use your normal soap and shampoo to wash your face and hair. Turn off the shower or move out of the shower stream. Pour the CHG onto a clean washcloth. Do not use any type of brush or rough-edged sponge. Starting at your neck, lather your body down to your toes. Make sure you follow these instructions: If you will be having surgery, pay special attention to the part of your body where you will be having surgery. Scrub this area for at least 1 minute. Do not use CHG on your head or face. If the solution gets into your ears or eyes, rinse them well with water. Avoid your genital area. Avoid any areas of skin that have broken skin, cuts, or scrapes. Scrub your back and under your arms. Make sure to wash skin folds. Let the lather sit on your skin for 1-2 minutes or as long as told by your health care provider. Thoroughly rinse your entire body in the shower. Make sure that all body creases and crevices are rinsed well. Dry off with a clean towel. Do not put any substances on your body afterward--such as powder, lotion, or perfume--unless you are told to do so by your health care provider. Only use lotions that are recommended by the manufacturer. Put on clean clothes or pajamas. If it is the night before your surgery, sleep in clean sheets.  During a sponge bath Follow these steps when using CHG solution during a sponge bath (unless your health care provider gives you different instructions): Use your normal soap and shampoo to wash your face and hair. Pour the CHG onto a clean washcloth. Starting at your neck, lather your body down to your toes. Make sure you follow these instructions: If you will be having surgery, pay special attention to the part of your body where you will be having surgery. Scrub this area for at least 1 minute. Do not use CHG on your  head or face. If the solution gets into your ears or eyes,  rinse them well with water. Avoid your genital area. Avoid any areas of skin that have broken skin, cuts, or scrapes. Scrub your back and under your arms. Make sure to wash skin folds. Let the lather sit on your skin for 1-2 minutes or as long as told by your health care provider. Using a different clean, wet washcloth, thoroughly rinse your entire body. Make sure that all body creases and crevices are rinsed well. Dry off with a clean towel. Do not put any substances on your body afterward--such as powder, lotion, or perfume--unless you are told to do so by your health care provider. Only use lotions that are recommended by the manufacturer. Put on clean clothes or pajamas. If it is the night before your surgery, sleep in clean sheets. How to use CHG prepackaged cloths Only use CHG cloths as told by your health care provider, and follow the instructions on the label. Use the CHG cloth on clean, dry skin. Do not use the CHG cloth on your head or face unless your health care provider tells you to. When washing with the CHG cloth: Avoid your genital area. Avoid any areas of skin that have broken skin, cuts, or scrapes. Before surgery Follow these steps when using a CHG cloth to clean before surgery (unless your health care provider gives you different instructions): Using the CHG cloth, vigorously scrub the part of your body where you will be having surgery. Scrub using a back-and-forth motion for 3 minutes. The area on your body should be completely wet with CHG when you are done scrubbing. Do not rinse. Discard the cloth and let the area air-dry. Do not put any substances on the area afterward, such as powder, lotion, or perfume. Put on clean clothes or pajamas. If it is the night before your surgery, sleep in clean sheets.  For general bathing Follow these steps when using CHG cloths for general bathing (unless your health care  provider gives you different instructions). Use a separate CHG cloth for each area of your body. Make sure you wash between any folds of skin and between your fingers and toes. Wash your body in the following order, switching to a new cloth after each step: The front of your neck, shoulders, and chest. Both of your arms, under your arms, and your hands. Your stomach and groin area, avoiding the genitals. Your right leg and foot. Your left leg and foot. The back of your neck, your back, and your buttocks. Do not rinse. Discard the cloth and let the area air-dry. Do not put any substances on your body afterward--such as powder, lotion, or perfume--unless you are told to do so by your health care provider. Only use lotions that are recommended by the manufacturer. Put on clean clothes or pajamas. Contact a health care provider if: Your skin gets irritated after scrubbing. You have questions about using your solution or cloth. You swallow any chlorhexidine. Call your local poison control center (1-(934)195-8201 in the [place] Get help right away if: Your eyes itch badly, or they become very red or swollen. Your skin itches badly and is red or swollen. Your hearing changes. You have trouble seeing. You have swelling or tingling in your mouth or throat. You have trouble breathing. These symptoms may represent a serious problem that is an emergency. Do not wait to see if the symptoms will go away. Get medical help right away. Call your local emergency services (911 in the [place] Do not drive yourself to the  hospital. Summary Chlorhexidine gluconate (CHG) is a germ-killing (antiseptic) solution that is used to clean the skin. Cleaning your skin with CHG may help to lower your risk for infection. You may be given CHG to use for bathing. It may be in a bottle or in a prepackaged cloth to use on your skin. Carefully follow your health care provider's instructions and the instructions on the product  label. Do not use CHG if you have a chlorhexidine allergy. Contact your health care provider if your skin gets irritated after scrubbing. This information is not intended to replace advice given to you by your health care provider. Make sure you discuss any questions you have with your health care provider. Document Revised: 11/08/2020 Document Reviewed: 11/08/2020 Elsevier Patient Education  2022 [employer].

## 2021-08-17 ENCOUNTER — Encounter (HOSPITAL_COMMUNITY)
Admission: RE | Admit: 2021-08-17 | Discharge: 2021-08-17 | Disposition: A | Discharge status: Home / Self Care | Payer: Medicare HMO | Source: Ambulatory Visit | Attending: General Surgery | Admitting: General Surgery

## 2021-08-17 ENCOUNTER — Encounter: Payer: Self-pay | Admitting: Physical Medicine and Rehabilitation

## 2021-08-17 ENCOUNTER — Encounter (HOSPITAL_COMMUNITY): Payer: Self-pay

## 2021-08-17 ENCOUNTER — Other Ambulatory Visit: Payer: Self-pay

## 2021-08-17 ENCOUNTER — Ambulatory Visit: Payer: Medicare HMO | Admitting: Physical Medicine and Rehabilitation

## 2021-08-17 VITALS — BP 142/87 | HR 79

## 2021-08-17 DIAGNOSIS — R1031 Right lower quadrant pain: Secondary | ICD-10-CM

## 2021-08-17 DIAGNOSIS — M7071 Other bursitis of hip, right hip: Secondary | ICD-10-CM

## 2021-08-17 DIAGNOSIS — M7918 Myalgia, other site: Secondary | ICD-10-CM | POA: Diagnosis not present

## 2021-08-17 HISTORY — DX: Sleep apnea, unspecified: G47.30

## 2021-08-17 NOTE — Progress Notes (Signed)
[name] - 65 y.o. female MRN [id number]  Date of birth: 01-02-1956  Office Visit Note: Visit Date: 08/17/2021 PCP: Glenda Chroman, MD Referred by: Glenda Chroman, MD  Subjective: Chief Complaint  Patient presents with   Lower Back - Pain   HPI: Marie Farmer is a 65 y.o. female who comes in today for evaluation of right sided buttock pain. Patient reports pain has been ongoing for several months. Patient was previously treated in 2019 by Dr. Magnus Sinning for chronic lower back pain and did undergo lumbar facet joint injections and ultimately radiofrequency ablation with good success. Patient also received left greater trochanteric bursa injection in 2019 and reports good pain relief with this procedure. Patient recently moved back to [place] from [place] in September.   Patient reports right buttock pain is exacerbated by sitting and sleeping. Patient states she is now using a doughnut pillow when sitting and is not able to sleep on right buttock due to severe pain. Patient appears to be uncomfortable in the office today and has to change positions frequently while sitting in chair. Patient reports some pain relief with standing and stretching exercises. Patient also reports intermittent use of Zanaflex and Robaxin. Patient reports she is currently attending formal physical therapy in [place], [place] and has also attended PT in [place] in the past for chronic lower back issues. Patient's recent lumbar MRI from February of 2022 exhibits mild degenerative changes of the lumbar spine. No high grade spinal canal stenosis noted. Patient states she had consult with Dr. Cristine Polio a neurosurgeon at [facility] in March of 2022 whom recommended left L4-L5 laminectomy. Patient states she did not want to proceed with surgery at that time.   Incidentally, patient also mentioned recent onset of right sided groin pain. Patient states she had bilateral hips evaluated by  orthopedist in [place] in February of 2022 and was told that her hips looked good. Bilateral hip x-rays at that time did not reveal any degenerative changes, fractures or dislocations.   Patient denies focal weakness, numbness and tingling. Patient denies recent trauma or falls.  Review of Systems  Musculoskeletal:        Pt reports right sided buttock pain for several weeks.  Neurological:  Negative for tingling, sensory change, focal weakness and weakness.  All other systems reviewed and are negative. Otherwise per HPI.  Assessment & Plan: Visit Diagnoses:    ICD-10-CM   1. Ischial bursitis, right  M70.71 Ambulatory referral to Physical Medicine Rehab    2. Pain in right buttock  M79.18     3. Groin pain, right  R10.31        Plan: Findings:  Chronic, worsening and severe right sided buttock pain. Patient continues to have severe pain despite good conservative therapies such as formal physical therapy, use of supportive doughnut pillow and medications. Patient's clinical presentation and exam are consistent with ischial bursitis. She does have severe pain upon palpation of right buttock region. We feel the next step is to perform diagnostic and hopefully therapeutic right ischial bursa steroid injection under fluorsoscopic guidance. We feel that we can get patient in quickly for injection.   We did speak with patient in detail about recent right sided groin pain. We feel that if she continues to have pain it would be appropriate to have her formally evaluated by one of our orthopedic providers. We did recommend Dr. Rodell Perna since he is treating patients in the [facility]  office. We informed patient that we are happy to place referral for her if needed.   No red flag symptoms noted upon exam today.    Meds & Orders: No orders of the defined types were placed in this encounter.   Orders Placed This Encounter  Procedures   Ambulatory referral to Physical Medicine Rehab    Follow-up: Return  for Right ischial bursa steroid injection.   Procedures: No procedures performed      Clinical History: [facility] LUMBAR SPINE WITHOUT CONTRAST   TECHNIQUE: Multiplanar, multisequence MR imaging of the lumbar spine was performed. No intravenous contrast was administered.   COMPARISON:  09/27/2012 lumbar spine MRI. 10/23/2017 lumbar spine radiographs.   FINDINGS: Segmentation:  Transitional S1 vertebral body.   Alignment:  Physiologic.   Vertebrae:  No fracture, evidence of discitis, or bone lesion.   Conus medullaris and cauda equina: Conus extends to the L1-2 level. Conus and cauda equina appear normal.   Paraspinal and other soft tissues: Negative.   Disc levels:   L1-2: No significant disc displacement, foraminal stenosis, or canal stenosis.   L2-3: No significant disc displacement, foraminal stenosis, or canal stenosis.   L3-4: No significant disc displacement, foraminal stenosis, or canal stenosis.   L4-5: Mild disc bulge and facet hypertrophy. No significant foraminal or canal stenosis.   L5-S1: Mild disc bulge and facet hypertrophy. No significant foraminal or canal stenosis.   IMPRESSION: 1. No acute osseous abnormality or malalignment of the lumbar spine. 2. Stable mild lumbar spondylosis with disc and facet degenerative changes at the L4-5 and L5-S1 levels. No significant foraminal or canal stenosis.     Electronically Signed   By: Kristine Garbe M.D.   On: 02/01/2018 20:03  1. Degenerative disc disease and lower lumbar spondylosis, without impingement identified.   She reports that she has never smoked. She has never used smokeless tobacco. No results for input(s): HGBA1C, LABURIC in the last 8760 hours.  Objective:  VS:  HT:    WT:   BMI:     BP:(!) 142/87  HR:79bpm  TEMP: ( )  RESP:  Physical Exam Vitals and nursing note reviewed.  HENT:     Head: Normocephalic and atraumatic.     Right Ear: External ear normal.     Left Ear:  External ear normal.     Nose: Nose normal.     Mouth/Throat:     Mouth: Mucous membranes are moist.  Eyes:     Extraocular Movements: Extraocular movements intact.  Cardiovascular:     Rate and Rhythm: Normal rate.     Pulses: Normal pulses.  Pulmonary:     Effort: Pulmonary effort is normal.  Abdominal:     General: Abdomen is flat. There is no distension.  Musculoskeletal:        General: Tenderness present.     Cervical back: Normal range of motion.     Comments: Pt rises from seated position to standing without difficulty. Good lumbar range of motion. Strong distal strength without clonus, no pain upon palpation of greater trochanters. No pain noted with bilateral hip internal/external rotation. Sensation intact bilaterally. Pain/tenderness noted upon palpation of right buttock region. Walks independently, gait steady.   Skin:    General: Skin is warm and dry.     Capillary Refill: Capillary refill takes less than 2 seconds.  Neurological:     General: No focal deficit present.     Mental Status: She is alert and oriented to person, place, and  time.  Psychiatric:        Mood and Affect: Mood normal.    Ortho Exam  Imaging: No results found.  Past Medical/Family/Surgical/Social History: Medications & Allergies reviewed per EMR, new medications updated. Patient Active Problem List   Diagnosis Date Noted   Plantar fasciitis 04/18/2018   Dyspnea on exertion 10/16/2016   Essential hypertension 10/16/2016   Morbid obesity due to excess calories ([place]) 10/16/2016   IBS (irritable bowel syndrome) 02/29/2012   Tibial plateau fracture 15/40/0867   HELICOBACTER PYLORI INFECTION 08/25/2009   DEPRESSION 08/25/2009   ASTHMA 08/25/2009   GASTROESOPHAGEAL REFLUX DISEASE, CHRONIC 08/25/2009   ALLERGY 08/25/2009   Cholelithiasis 08/25/2009   Past Medical History:  Diagnosis Date   Anxiety    Asthma    DDD (degenerative disc disease)    chronic back pain   Fibromyalgia    Foot  pain    GERD (gastroesophageal reflux disease)    Hypertension    IBS (irritable bowel syndrome)    Mood disorder (HCC)    PONV (postoperative nausea and vomiting)    Sleep apnea    Venous insufficiency    Wears glasses    Family History  Problem Relation Age of Onset   Diabetes Mother    Coronary artery disease Mother    COPD Mother    Colon polyps Mother        age 41   Lung cancer Father    Past Surgical History:  Procedure Laterality Date   CARPAL TUNNEL RELEASE Right 06/10/2013   Procedure: CARPAL TUNNEL RELEASE;  Surgeon: Wynonia Sours, MD;  Location: [facility];  Service: Orthopedics;  Laterality: Right;   COLONOSCOPY  12/20/2011   [name]-single diminutive sessile polyp in the sigmoid colon/small interenal hemorrhoids   COLONOSCOPY N/A 11/28/2017   Procedure: COLONOSCOPY;  Surgeon: Daneil Dolin, MD;  [facility];  Service: Endoscopy;  Laterality: N/A;  10:30   COLONOSCOPY WITH PROPOFOL N/A 07/07/2021   Procedure: COLONOSCOPY WITH PROPOFOL;  Surgeon: Daneil Dolin, MD;  [facility];  Service: Endoscopy;  Laterality: N/A;  12:15pm   ESOPHAGOGASTRODUODENOSCOPY  04/10/2003   Normal esophagus/Antral erosions, as described above.  The remainder of the stomach appeared normal.  Normal first and second parts of the duodenum.   ESOPHAGOGASTRODUODENOSCOPY  12/20/2011   [name]-hiatus hernia/chronic gastritis/normal duodenumNEGATIVE H pylori, negative SB biopsy   POLYPECTOMY  07/07/2021   Procedure: POLYPECTOMY;  Surgeon: Daneil Dolin, MD;  [facility];  Service: Endoscopy;;   TIBIA FRACTURE SURGERY  09/11/2010   right   Social History   Occupational History   Occupation: unemployed  Tobacco Use   Smoking status: Never   Smokeless tobacco: Never  Substance and Sexual Activity   Alcohol use: Yes    Comment: socially, couple times per week   Drug use: No    Types: Marijuana    Comment: remote [place]    Sexual activity: Never    Birth control/protection: None

## 2021-08-17 NOTE — Progress Notes (Signed)
Pt state lower back pain that travels to her buttock and right groin pain. Pt state sitting and walking makes the pain worse. Pt state she goes to PT and takes pain meds to help ease her pain.  Numeric Pain Rating Scale and Functional Assessment Average Pain 8 Pain Right Now 5 My pain is constant, burning, dull, and aching Pain is worse with: walking, sitting, and some activites Pain improves with: therapy/exercise and medication   In the last MONTH (on 0-10 scale) has pain interfered with the following?  1. General activity like being  able to carry out your everyday physical activities such as walking, climbing stairs, carrying groceries, or moving a chair?  Rating(7)  2. Relation with others like being able to carry out your usual social activities and roles such as  activities at home, at work and in your community. Rating(8)  3. Enjoyment of life such that you have  been bothered by emotional problems such as feeling anxious, depressed or irritable?  Rating(9)

## 2021-08-18 DIAGNOSIS — M545 Low back pain, unspecified: Secondary | ICD-10-CM | POA: Diagnosis not present

## 2021-08-18 DIAGNOSIS — S76311D Strain of muscle, fascia and tendon of the posterior muscle group at thigh level, right thigh, subsequent encounter: Secondary | ICD-10-CM | POA: Diagnosis not present

## 2021-08-18 DIAGNOSIS — M6281 Muscle weakness (generalized): Secondary | ICD-10-CM | POA: Diagnosis not present

## 2021-08-18 DIAGNOSIS — M25552 Pain in left hip: Secondary | ICD-10-CM | POA: Diagnosis not present

## 2021-08-19 ENCOUNTER — Encounter (HOSPITAL_COMMUNITY)
Admission: RE | Disposition: A | Discharge status: Home / Self Care | Payer: Self-pay | Source: Home / Self Care | Attending: General Surgery

## 2021-08-19 ENCOUNTER — Ambulatory Visit (HOSPITAL_COMMUNITY)
Admission: RE | Admit: 2021-08-19 | Discharge: 2021-08-19 | Disposition: A | Discharge status: Home / Self Care | Payer: Medicare HMO | Attending: General Surgery | Admitting: General Surgery

## 2021-08-19 ENCOUNTER — Ambulatory Visit (HOSPITAL_COMMUNITY): Payer: Medicare HMO | Admitting: Anesthesiology

## 2021-08-19 ENCOUNTER — Encounter (HOSPITAL_COMMUNITY): Payer: Self-pay | Admitting: General Surgery

## 2021-08-19 DIAGNOSIS — K8020 Calculus of gallbladder without cholecystitis without obstruction: Secondary | ICD-10-CM | POA: Diagnosis not present

## 2021-08-19 DIAGNOSIS — F419 Anxiety disorder, unspecified: Secondary | ICD-10-CM | POA: Insufficient documentation

## 2021-08-19 DIAGNOSIS — K219 Gastro-esophageal reflux disease without esophagitis: Secondary | ICD-10-CM | POA: Diagnosis not present

## 2021-08-19 DIAGNOSIS — G709 Myoneural disorder, unspecified: Secondary | ICD-10-CM | POA: Diagnosis not present

## 2021-08-19 DIAGNOSIS — M797 Fibromyalgia: Secondary | ICD-10-CM | POA: Insufficient documentation

## 2021-08-19 DIAGNOSIS — J45909 Unspecified asthma, uncomplicated: Secondary | ICD-10-CM | POA: Diagnosis not present

## 2021-08-19 DIAGNOSIS — D649 Anemia, unspecified: Secondary | ICD-10-CM | POA: Diagnosis not present

## 2021-08-19 DIAGNOSIS — F32A Depression, unspecified: Secondary | ICD-10-CM | POA: Diagnosis not present

## 2021-08-19 DIAGNOSIS — G4730 Sleep apnea, unspecified: Secondary | ICD-10-CM | POA: Diagnosis not present

## 2021-08-19 DIAGNOSIS — I10 Essential (primary) hypertension: Secondary | ICD-10-CM | POA: Insufficient documentation

## 2021-08-19 DIAGNOSIS — M1990 Unspecified osteoarthritis, unspecified site: Secondary | ICD-10-CM | POA: Insufficient documentation

## 2021-08-19 DIAGNOSIS — K8080 Other cholelithiasis without obstruction: Secondary | ICD-10-CM | POA: Diagnosis present

## 2021-08-19 DIAGNOSIS — K8010 Calculus of gallbladder with chronic cholecystitis without obstruction: Secondary | ICD-10-CM | POA: Insufficient documentation

## 2021-08-19 HISTORY — PX: CHOLECYSTECTOMY: SHX55

## 2021-08-19 SURGERY — LAPAROSCOPIC CHOLECYSTECTOMY
Anesthesia: General | Site: Abdomen

## 2021-08-19 MED ORDER — HEMOSTATIC AGENTS (NO CHARGE) OPTIME
TOPICAL | Status: DC | PRN
  Administered 2021-08-19: 1 via TOPICAL

## 2021-08-19 MED ORDER — CHLORHEXIDINE GLUCONATE CLOTH 2 % EX PADS: 6.0000 | MEDICATED_PAD | Freq: Once | CUTANEOUS | Status: DC

## 2021-08-19 MED ORDER — ROCURONIUM BROMIDE 100 MG/10ML IV SOLN
INTRAVENOUS | Status: DC | PRN
  Administered 2021-08-19: 50 mg via INTRAVENOUS

## 2021-08-19 MED ORDER — FENTANYL CITRATE (PF) 100 MCG/2ML IJ SOLN
INTRAMUSCULAR | Status: DC | PRN
  Administered 2021-08-19 (×2): 25 ug via INTRAVENOUS
  Administered 2021-08-19: 50 ug via INTRAVENOUS

## 2021-08-19 MED ORDER — BUPIVACAINE HCL (PF) 0.5 % IJ SOLN
INTRAMUSCULAR | Status: AC
  Filled 2021-08-19: qty 30

## 2021-08-19 MED ORDER — ONDANSETRON HCL 4 MG/2ML IJ SOLN
INTRAMUSCULAR | Status: AC
  Filled 2021-08-19: qty 2

## 2021-08-19 MED ORDER — DEXAMETHASONE SODIUM PHOSPHATE 10 MG/ML IJ SOLN
INTRAMUSCULAR | Status: AC
  Filled 2021-08-19: qty 1

## 2021-08-19 MED ORDER — METOCLOPRAMIDE HCL 5 MG/ML IJ SOLN
10.0000 mg | Freq: Once | INTRAMUSCULAR | Status: AC
  Administered 2021-08-19: 10 mg via INTRAVENOUS

## 2021-08-19 MED ORDER — DEXAMETHASONE SODIUM PHOSPHATE 10 MG/ML IJ SOLN
INTRAMUSCULAR | Status: DC | PRN
  Administered 2021-08-19: 10 mg via INTRAVENOUS

## 2021-08-19 MED ORDER — MEPERIDINE HCL 50 MG/ML IJ SOLN
6.2500 mg | INTRAMUSCULAR | Status: DC | PRN
Start: 2021-08-19 — End: 2021-08-19

## 2021-08-19 MED ORDER — EPHEDRINE 5 MG/ML INJ
INTRAVENOUS | Status: AC
  Filled 2021-08-19: qty 5

## 2021-08-19 MED ORDER — LACTATED RINGERS IV SOLN
INTRAVENOUS | Status: DC
  Administered 2021-08-19: 1000 mL via INTRAVENOUS

## 2021-08-19 MED ORDER — ROCURONIUM BROMIDE 10 MG/ML (PF) SYRINGE
PREFILLED_SYRINGE | INTRAVENOUS | Status: AC
  Filled 2021-08-19: qty 10

## 2021-08-19 MED ORDER — CHLORHEXIDINE GLUCONATE 0.12 % MT SOLN
15.0000 mL | Freq: Once | OROMUCOSAL | Status: AC
  Administered 2021-08-19: 15 mL via OROMUCOSAL

## 2021-08-19 MED ORDER — MIDAZOLAM HCL 5 MG/5ML IJ SOLN
INTRAMUSCULAR | Status: DC | PRN
  Administered 2021-08-19: 1 mg via INTRAVENOUS

## 2021-08-19 MED ORDER — ONDANSETRON HCL 4 MG PO TABS: 4.0000 mg | ORAL_TABLET | Freq: Three times a day (TID) | ORAL | 1 refills | Status: DC | PRN

## 2021-08-19 MED ORDER — BUPIVACAINE HCL (PF) 0.5 % IJ SOLN
INTRAMUSCULAR | Status: DC | PRN
  Administered 2021-08-19: 20 mL

## 2021-08-19 MED ORDER — ONDANSETRON HCL 4 MG/2ML IJ SOLN
INTRAMUSCULAR | Status: DC | PRN
  Administered 2021-08-19: 4 mg via INTRAVENOUS

## 2021-08-19 MED ORDER — ORAL CARE MOUTH RINSE: 15.0000 mL | Freq: Once | OROMUCOSAL | Status: AC

## 2021-08-19 MED ORDER — SODIUM CHLORIDE 0.9 % IV SOLN
2.0000 g | INTRAVENOUS | Status: AC
  Administered 2021-08-19: 2 g via INTRAVENOUS

## 2021-08-19 MED ORDER — LIDOCAINE HCL (PF) 2 % IJ SOLN
INTRAMUSCULAR | Status: AC
  Filled 2021-08-19: qty 5

## 2021-08-19 MED ORDER — LIDOCAINE 2% (20 MG/ML) 5 ML SYRINGE
INTRAMUSCULAR | Status: DC | PRN
  Administered 2021-08-19: 60 mg via INTRAVENOUS

## 2021-08-19 MED ORDER — FENTANYL CITRATE (PF) 100 MCG/2ML IJ SOLN
INTRAMUSCULAR | Status: AC
  Filled 2021-08-19: qty 2

## 2021-08-19 MED ORDER — KETOROLAC TROMETHAMINE 30 MG/ML IJ SOLN
INTRAMUSCULAR | Status: DC | PRN
  Administered 2021-08-19: 30 mg via INTRAVENOUS

## 2021-08-19 MED ORDER — TRAMADOL HCL 50 MG PO TABS: 50.0000 mg | ORAL_TABLET | Freq: Four times a day (QID) | ORAL | 0 refills | Status: DC | PRN

## 2021-08-19 MED ORDER — HYDROMORPHONE HCL 1 MG/ML IJ SOLN: 0.2500 mg | INTRAMUSCULAR | Status: DC | PRN

## 2021-08-19 MED ORDER — PROPOFOL 10 MG/ML IV BOLUS
INTRAVENOUS | Status: DC | PRN
  Administered 2021-08-19: 200 mg via INTRAVENOUS

## 2021-08-19 MED ORDER — SODIUM CHLORIDE 0.9 % IV SOLN
INTRAVENOUS | Status: AC
  Filled 2021-08-19: qty 2

## 2021-08-19 MED ORDER — SCOPOLAMINE 1 MG/3DAYS TD PT72
1.0000 | MEDICATED_PATCH | Freq: Once | TRANSDERMAL | Status: DC
  Administered 2021-08-19: 1.5 mg via TRANSDERMAL

## 2021-08-19 MED ORDER — PROPOFOL 10 MG/ML IV BOLUS
INTRAVENOUS | Status: AC
  Filled 2021-08-19: qty 20

## 2021-08-19 MED ORDER — SUGAMMADEX SODIUM 200 MG/2ML IV SOLN
INTRAVENOUS | Status: DC | PRN
  Administered 2021-08-19: 200 mg via INTRAVENOUS

## 2021-08-19 MED ORDER — SCOPOLAMINE 1 MG/3DAYS TD PT72
MEDICATED_PATCH | TRANSDERMAL | Status: AC
  Filled 2021-08-19: qty 1

## 2021-08-19 MED ORDER — MIDAZOLAM HCL 2 MG/2ML IJ SOLN
INTRAMUSCULAR | Status: AC
  Filled 2021-08-19: qty 2

## 2021-08-19 MED ORDER — ONDANSETRON HCL 4 MG/2ML IJ SOLN
4.0000 mg | Freq: Once | INTRAMUSCULAR | Status: AC | PRN
  Administered 2021-08-19: 4 mg via INTRAVENOUS
  Filled 2021-08-19: qty 2

## 2021-08-19 MED ORDER — SODIUM CHLORIDE 0.9 % IR SOLN
Status: DC | PRN
  Administered 2021-08-19: 1000 mL

## 2021-08-19 MED ORDER — METOCLOPRAMIDE HCL 5 MG/ML IJ SOLN
INTRAMUSCULAR | Status: AC
  Filled 2021-08-19: qty 2

## 2021-08-19 SURGICAL SUPPLY — 43 items
ADH SKN CLS APL DERMABOND .7 (GAUZE/BANDAGES/DRESSINGS) ×1
APL PRP STRL LF DISP 70% ISPRP (MISCELLANEOUS) ×1
APPLIER CLIP ROT 10 11.4 M/L (STAPLE) ×2
APR CLP MED LRG 11.4X10 (STAPLE) ×1
BAG RETRIEVAL 10 (BASKET) ×1
BLADE SURG 15 STRL LF DISP TIS (BLADE) ×1 IMPLANT
BLADE SURG 15 STRL SS (BLADE) ×2
CHLORAPREP W/TINT 26 (MISCELLANEOUS) ×2 IMPLANT
CLIP APPLIE ROT 10 11.4 M/L (STAPLE) ×1 IMPLANT
CLOTH BEACON ORANGE TIMEOUT ST (SAFETY) ×2 IMPLANT
COVER LIGHT HANDLE STERIS (MISCELLANEOUS) ×4 IMPLANT
DERMABOND ADVANCED (GAUZE/BANDAGES/DRESSINGS) ×1
DERMABOND ADVANCED .7 DNX12 (GAUZE/BANDAGES/DRESSINGS) ×1 IMPLANT
ELECT REM PT RETURN 9FT ADLT (ELECTROSURGICAL) ×2
ELECTRODE REM PT RTRN 9FT ADLT (ELECTROSURGICAL) ×1 IMPLANT
GAUZE 4X4 16PLY ~~LOC~~+RFID DBL (SPONGE) ×2 IMPLANT
GLOVE SURG ENC MOIS LTX SZ6.5 (GLOVE) ×2 IMPLANT
GLOVE SURG POLYISO LF SZ6.5 (GLOVE) ×4 IMPLANT
GLOVE SURG UNDER POLY LF SZ7 (GLOVE) ×10 IMPLANT
GOWN STRL REUS W/ TWL LRG LVL3 (GOWN DISPOSABLE) ×1 IMPLANT
GOWN STRL REUS W/TWL LRG LVL3 (GOWN DISPOSABLE) ×8 IMPLANT
HEMOSTAT SNOW SURGICEL 2X4 (HEMOSTASIS) ×2 IMPLANT
INST SET LAPROSCOPIC AP (KITS) ×2 IMPLANT
KIT TURNOVER KIT A (KITS) ×2 IMPLANT
MANIFOLD NEPTUNE II (INSTRUMENTS) ×2 IMPLANT
NEEDLE HYPO 18GX1.5 BLUNT FILL (NEEDLE) ×2 IMPLANT
NEEDLE INSUFFLATION 14GA 120MM (NEEDLE) ×2 IMPLANT
NS IRRIG 1000ML POUR BTL (IV SOLUTION) ×2 IMPLANT
PACK LAP CHOLE LZT030E (CUSTOM PROCEDURE TRAY) ×2 IMPLANT
PAD ARMBOARD 7.5X6 YLW CONV (MISCELLANEOUS) ×2 IMPLANT
SET BASIN LINEN APH (SET/KITS/TRAYS/PACK) ×2 IMPLANT
SET TUBE SMOKE EVAC HIGH FLOW (TUBING) ×2 IMPLANT
SLEEVE ENDOPATH XCEL 5M (ENDOMECHANICALS) ×2 IMPLANT
SUT MNCRL AB 4-0 PS2 18 (SUTURE) ×4 IMPLANT
SUT VICRYL 0 UR6 27IN ABS (SUTURE) ×2 IMPLANT
SYR 50ML LL SCALE MARK (SYRINGE) ×2 IMPLANT
SYS BAG RETRIEVAL 10MM (BASKET) ×1
SYSTEM BAG RETRIEVAL 10MM (BASKET) ×1 IMPLANT
TROCAR ENDO BLADELESS 11MM (ENDOMECHANICALS) ×2 IMPLANT
TROCAR XCEL NON-BLD 5MMX100MML (ENDOMECHANICALS) ×2 IMPLANT
TROCAR XCEL UNIV SLVE 11M 100M (ENDOMECHANICALS) ×2 IMPLANT
TUBE CONNECTING 12X1/4 (SUCTIONS) ×2 IMPLANT
WARMER LAPAROSCOPE (MISCELLANEOUS) ×2 IMPLANT

## 2021-08-19 NOTE — Interval H&P Note (Signed)
History and Physical Interval Note:  08/19/2021 7:26 AM  Marie Farmer  has presented today for surgery, with the diagnosis of Cholelithiasis.  The various methods of treatment have been discussed with the patient and family. After consideration of risks, benefits and other options for treatment, the patient has consented to  Procedure(s): LAPAROSCOPIC CHOLECYSTECTOMY (N/A) as a surgical intervention.  The patient's history has been reviewed, patient examined, no change in status, stable for surgery.  I have reviewed the patient's chart and labs.  Questions were answered to the patient's satisfaction.     Virl Cagey

## 2021-08-19 NOTE — Transfer of Care (Signed)
Immediate Anesthesia Transfer of Care Note  Patient: Marie Farmer  Procedure(s) Performed: LAPAROSCOPIC CHOLECYSTECTOMY (Abdomen)  Patient Location: PACU  Anesthesia Type:General  Level of Consciousness: awake and alert   Airway & Oxygen Therapy: Patient Spontanous Breathing  Post-op Assessment: Report given to RN and Post -op Vital signs reviewed and stable  Post vital signs: Reviewed and stable  Last Vitals:  Vitals Value Taken Time  BP 159/83 08/19/21 0852  Temp 36.5 C 08/19/21 0852  Pulse 62 08/19/21 0858  Resp 14 08/19/21 0858  SpO2 96 % 08/19/21 0858  Vitals shown include unvalidated device data.  Last Pain:  Vitals:   08/19/21 0647  TempSrc: Oral  PainSc: 0-No pain      Patients Stated Pain Goal: 7 (53/91/22 5834)  Complications: No notable events documented.

## 2021-08-19 NOTE — Anesthesia Preprocedure Evaluation (Addendum)
Anesthesia Evaluation  Patient identified by MRN, date of birth, ID band Patient awake    Reviewed: Allergy & Precautions, NPO status , Patient's Chart, lab work & pertinent test results  History of Anesthesia Complications (+) PONV and history of anesthetic complications  Airway Mallampati: II  TM Distance: >3 FB Neck ROM: Full    Dental  (+) Dental Advisory Given, Missing Crowns :   Pulmonary asthma , sleep apnea ,    Pulmonary exam normal breath sounds clear to auscultation       Cardiovascular Exercise Tolerance: Good hypertension, Pt. on medications Normal cardiovascular exam Rhythm:Regular Rate:Normal  04-Jul-2021 12:54:23 [facility] ROUTINE RECORD 1955/09/18 (61 yr) Female Caucasian Vent. rate 72 BPM PR interval 124 ms QRS duration 90 ms QT/QTcB 372/407 ms P-R-T axes 28 -15 42 Normal sinus rhythm Low voltage QRS Confirmed by Asencion Noble [phone]) on 07/04/2021 7:00:18 PM   Neuro/Psych PSYCHIATRIC DISORDERS Anxiety Depression  Neuromuscular disease    GI/Hepatic Neg liver ROS, GERD  Medicated and Controlled,  Endo/Other  negative endocrine ROS  Renal/GU negative Renal ROS  negative genitourinary   Musculoskeletal  (+) Arthritis , Osteoarthritis,  Fibromyalgia -  Abdominal   Peds negative pediatric ROS (+)  Hematology negative hematology ROS (+)   Anesthesia Other Findings   Reproductive/Obstetrics negative OB ROS                            Anesthesia Physical  Anesthesia Plan  ASA: 3  Anesthesia Plan: General   Post-op Pain Management: Dilaudid IV   Induction: Intravenous  PONV Risk Score and Plan: 4 or greater and Ondansetron, Dexamethasone, Midazolam and Scopolamine patch - Pre-op  Airway Management Planned: Oral ETT  Additional Equipment:   Intra-op Plan:   Post-operative Plan: Extubation in OR  Informed Consent: I have reviewed the  patients History and Physical, chart, labs and discussed the procedure including the risks, benefits and alternatives for the proposed anesthesia with the patient or authorized representative who has indicated his/her understanding and acceptance.     Dental advisory given  Plan Discussed with: CRNA and Surgeon  Anesthesia Plan Comments:        Anesthesia Quick Evaluation

## 2021-08-19 NOTE — Op Note (Signed)
Operative Note   Preoperative Diagnosis: Symptomatic cholelithiasis   Postoperative Diagnosis: Same   Procedure(s) Performed: Laparoscopic cholecystectomy   Surgeon: Ria Comment C. Constance Haw, MD   Assistants: Graciella Freer, DO   Anesthesia: General endotracheal   Anesthesiologist: Denese Killings, MD    Specimens: Gallbladder    Estimated Blood Loss: Minimal    Blood Replacement: None    Complications: None    Operative Findings: Distended gallbladder    Procedure: The patient was taken to the operating room and placed supine. General endotracheal anesthesia was induced. Intravenous antibiotics were administered per protocol. An orogastric tube positioned to decompress the stomach. The abdomen was prepared and draped in the usual sterile fashion.    A supraumbilical incision was made and a Veress technique was utilized to achieve pneumoperitoneum to 15 mmHg with carbon dioxide. A 11 mm optiview port was placed through the supraumbilical region, and a 10 mm 0-degree operative laparoscope was introduced. The area underlying the trocar and Veress needle were inspected and without evidence of injury.  Remaining trocars were placed under direct vision. Two 5 mm ports were placed in the right abdomen, between the anterior axillary and midclavicular line.  A final 11 mm port was placed through the mid-epigastrium, near the falciform ligament.    The gallbladder fundus was elevated cephalad and the infundibulum was retracted to the patient's right. The gallbladder/cystic duct junction was skeletonized. The cystic artery noted in the triangle of Calot and was also skeletonized.  We then continued liberal medial and lateral dissection until the critical view of safety was achieved.    The cystic duct and cystic artery were doubly clipped and divided. The gallbladder was then dissected from the liver bed with electrocautery. The specimen was placed in an Endopouch and was retrieved through  the epigastric site.   Final inspection revealed acceptable hemostasis. Surgical SNOW was placed in the gallbladder bed.  Trocars were removed and pneumoperitoneum was released.  0 Vicryl fascial sutures were used to close the  umbilical port site and the epigastric site was over the liver. Skin incisions were closed with 4-0 Monocryl subcuticular sutures and Dermabond. The patient was awakened from anesthesia and extubated without complication.    Curlene Labrum, MD [facility] [address] [place],  [address] [phone] (office)

## 2021-08-19 NOTE — Anesthesia Postprocedure Evaluation (Signed)
Anesthesia Post Note  Patient: Marie Farmer  Procedure(s) Performed: LAPAROSCOPIC CHOLECYSTECTOMY (Abdomen)  Patient location during evaluation: PACU Anesthesia Type: General Level of consciousness: awake and alert and oriented Pain management: pain level controlled Vital Signs Assessment: post-procedure vital signs reviewed and stable Respiratory status: spontaneous breathing, nonlabored ventilation and respiratory function stable Cardiovascular status: blood pressure returned to baseline and stable Postop Assessment: no apparent nausea or vomiting Anesthetic complications: no   No notable events documented.   Last Vitals:  Vitals:   08/19/21 0915 08/19/21 0937  BP: (!) 159/71 (!) 156/79  Pulse: 61 60  Resp: 10 14  Temp:  36.5 C  SpO2: 96% 96%    Last Pain:  Vitals:   08/19/21 0937  TempSrc: Oral  PainSc: 3                  Khilee Hendricksen C Keshayla Schrum

## 2021-08-19 NOTE — Anesthesia Procedure Notes (Signed)
Procedure Name: Intubation Date/Time: 08/19/2021 7:42 AM Performed by: Hewitt Blade, CRNA Pre-anesthesia Checklist: Patient identified, Emergency Drugs available, Suction available and Patient being monitored Patient Re-evaluated:Patient Re-evaluated prior to induction Oxygen Delivery Method: Circle system utilized Preoxygenation: Pre-oxygenation with 100% oxygen Induction Type: IV induction Ventilation: Mask ventilation without difficulty Laryngoscope Size: Mac and 3 Grade View: Grade II Tube type: Oral Tube size: 7.0 mm Number of attempts: 1 Airway Equipment and Method: Stylet Placement Confirmation: ETT inserted through vocal cords under direct vision, positive ETCO2 and breath sounds checked- equal and bilateral Secured at: 20 cm Tube secured with: Tape Dental Injury: Teeth and Oropharynx as per pre-operative assessment

## 2021-08-19 NOTE — Progress Notes (Signed)
[facility]  Spoke with the daughter about surgery. Rx sent to [facility] at the [facility] of daughter's preference.  Curlene Labrum, MD [facility] [address] [place], [place] [address] [phone] (office)

## 2021-08-19 NOTE — Discharge Instructions (Signed)
Discharge Laparoscopic Surgery Instructions:  Common Complaints: Right shoulder pain is common after laparoscopic surgery. This is secondary to the gas used in the surgery being trapped under the diaphragm.  Walk to help your body absorb the gas. This will improve in a few days. Pain at the port sites are common, especially the larger port sites. This will improve with time.  Some nausea is common and poor appetite. The main goal is to stay hydrated the first few days after surgery.   Diet/ Activity: Diet as tolerated. You may not have an appetite, but it is important to stay hydrated. Drink 64 ounces of water a day. Your appetite will return with time.  Shower per your regular routine daily.  Do not take hot showers. Take warm showers that are less than 10 minutes. Rest and listen to your body, but do not remain in bed all day.  Walk everyday for at least 15-20 minutes. Deep cough and move around every 1-2 hours in the first few days after surgery.  Do not lift > 10 lbs, perform excessive bending, pushing, pulling, squatting for 1-2 weeks after surgery.  Do not pick at the dermabond glue on your incision sites.  This glue film will remain in place for 1-2 weeks and will start to peel off.  Do not place lotions or balms on your incision unless instructed to specifically by Dr. Constance Haw.   Pain Expectations and Narcotics: -After surgery you will have pain associated with your incisions and this is normal. The pain is muscular and nerve pain, and will get better with time. -You are encouraged and expected to take non narcotic medications like tylenol and ibuprofen (when able) to treat pain as multiple modalities can aid with pain treatment. -Narcotics are only used when pain is severe or there is breakthrough pain. -You are not expected to have a pain score of 0 after surgery, as we cannot prevent pain. A pain score of 3-4 that allows you to be functional, move, walk, and tolerate some activity is  the goal. The pain will continue to improve over the days after surgery and is dependent on your surgery. -Due to [place] law, we are only able to give a certain amount of pain medication to treat post operative pain, and we only give additional narcotics on a patient by patient basis.  -For most laparoscopic surgery, studies have shown that the majority of patients only need 10-15 narcotic pills, and for open surgeries most patients only need 15-20.   -Having appropriate expectations of pain and knowledge of pain management with non narcotics is important as we do not want anyone to become addicted to narcotic pain medication.  -Using ice packs in the first 48 hours and heating pads after 48 hours, wearing an abdominal binder (when recommended), and using over the counter medications are all ways to help with pain management.   -Simple acts like meditation and mindfulness practices after surgery can also help with pain control and research has proven the benefit of these practices.  Medication: Take tylenol and ibuprofen as needed for pain control, alternating every 4-6 hours.  Example:  Tylenol 1000mg  @ 6am, 12noon, 6pm, 72midnight (Do not exceed 4000mg  of tylenol a day). Ibuprofen 800mg  @ 9am, 3pm, 9pm, 3am (Do not exceed 3600mg  of ibuprofen a day).  Take Tramadol for breakthrough pain every 4 hours.  Take Colace for constipation related to narcotic pain medication. If you do not have a bowel movement in 2 days, take Miralax  over the counter.  Drink plenty of water to also prevent constipation.   Contact Information: If you have questions or concerns, please call our office, [phone], Monday- Thursday 8AM-5PM and Friday 8AM-12Noon.  If it is after hours or on the weekend, please call [facility], [phone], [phone], and ask to speak to the surgeon on call for Dr. Constance Haw at [facility].

## 2021-08-22 ENCOUNTER — Encounter (HOSPITAL_COMMUNITY): Payer: Self-pay | Admitting: General Surgery

## 2021-08-22 LAB — SURGICAL PATHOLOGY

## 2021-08-23 ENCOUNTER — Telehealth: Payer: Self-pay | Admitting: *Deleted

## 2021-08-23 NOTE — Telephone Encounter (Signed)
Received call from patient [phone]~ telephone.   Surgical Date:08/19/2021 Procedure:Lap Chole  Concerns: Patient concerned about dietary restrictions after surgery. Advised to begin with bland diet and advance as tolerated. Advised to try to avoid high fat foods (animal fats, fried foods, etc).  Verbalized understanding.

## 2021-08-30 ENCOUNTER — Encounter: Payer: Self-pay | Admitting: Physical Medicine and Rehabilitation

## 2021-08-30 ENCOUNTER — Other Ambulatory Visit: Payer: Self-pay

## 2021-08-30 ENCOUNTER — Ambulatory Visit: Payer: Medicare HMO | Admitting: Physical Medicine and Rehabilitation

## 2021-08-30 ENCOUNTER — Ambulatory Visit: Payer: Self-pay

## 2021-08-30 DIAGNOSIS — M7071 Other bursitis of hip, right hip: Secondary | ICD-10-CM

## 2021-08-30 NOTE — Progress Notes (Signed)
Pt state right hip pain. Pt state sitting makes the pain worse. Pt state she take pain meds to help ease her pain.  Numeric Pain Rating Scale and Functional Assessment Average Pain 0   In the last MONTH (on 0-10 scale) has pain interfered with the following?  1. General activity like being  able to carry out your everyday physical activities such as walking, climbing stairs, carrying groceries, or moving a chair?  Rating(7)   -BT, -Dye Allergies. '

## 2021-09-01 ENCOUNTER — Ambulatory Visit (INDEPENDENT_AMBULATORY_CARE_PROVIDER_SITE_OTHER): Payer: Medicare HMO | Admitting: General Surgery

## 2021-09-01 DIAGNOSIS — M79676 Pain in unspecified toe(s): Secondary | ICD-10-CM | POA: Diagnosis not present

## 2021-09-01 DIAGNOSIS — M2011 Hallux valgus (acquired), right foot: Secondary | ICD-10-CM | POA: Diagnosis not present

## 2021-09-01 DIAGNOSIS — K8020 Calculus of gallbladder without cholecystitis without obstruction: Secondary | ICD-10-CM

## 2021-09-01 DIAGNOSIS — L84 Corns and callosities: Secondary | ICD-10-CM | POA: Diagnosis not present

## 2021-09-01 NOTE — Progress Notes (Signed)
[facility]  I am calling the patient for post operative evaluation. This is not a billable encounter as it is under the [place] charges for the surgery.  The patient had a laparoscopic cholecystectomy on 12/9. The patient reports that she has some uncomfortable areas at the incision and the glue peeled off.  The are tolerating a diet, having good pain control, and having regular Bms.  The patient has no concerns.  Likely will not be able to take a tub bath until 3-4 weeks. She can start back exercising and start back slow.  Activity and diet as tolerated.   Still having some belching and back pain. Told her to she may need an EGD if this continues after about 4 more weeks.   Pathology: FINAL MICROSCOPIC DIAGNOSIS:   A. GALLBLADDER, CHOLECYSTECTOMY:  - Chronic cholecystitis and cholelithiasis   Will see the patient PRN.   Curlene Labrum, MD [facility] [address] [place], [place] [address] [phone] (office)

## 2021-09-06 DIAGNOSIS — E041 Nontoxic single thyroid nodule: Secondary | ICD-10-CM | POA: Diagnosis not present

## 2021-09-06 DIAGNOSIS — E559 Vitamin D deficiency, unspecified: Secondary | ICD-10-CM | POA: Diagnosis not present

## 2021-09-06 DIAGNOSIS — D649 Anemia, unspecified: Secondary | ICD-10-CM | POA: Diagnosis not present

## 2021-09-06 DIAGNOSIS — I10 Essential (primary) hypertension: Secondary | ICD-10-CM | POA: Diagnosis not present

## 2021-09-06 DIAGNOSIS — E039 Hypothyroidism, unspecified: Secondary | ICD-10-CM | POA: Diagnosis not present

## 2021-09-07 DIAGNOSIS — Z299 Encounter for prophylactic measures, unspecified: Secondary | ICD-10-CM | POA: Diagnosis not present

## 2021-09-07 DIAGNOSIS — E669 Obesity, unspecified: Secondary | ICD-10-CM | POA: Diagnosis not present

## 2021-09-07 DIAGNOSIS — Z713 Dietary counseling and surveillance: Secondary | ICD-10-CM | POA: Diagnosis not present

## 2021-09-07 DIAGNOSIS — I10 Essential (primary) hypertension: Secondary | ICD-10-CM | POA: Diagnosis not present

## 2021-09-07 DIAGNOSIS — Z6834 Body mass index (BMI) 34.0-34.9, adult: Secondary | ICD-10-CM | POA: Diagnosis not present

## 2021-09-19 ENCOUNTER — Other Ambulatory Visit: Payer: Self-pay

## 2021-09-19 ENCOUNTER — Ambulatory Visit: Payer: Medicare HMO | Admitting: Physical Medicine and Rehabilitation

## 2021-09-19 ENCOUNTER — Encounter: Payer: Self-pay | Admitting: Physical Medicine and Rehabilitation

## 2021-09-19 VITALS — BP 127/76 | HR 80

## 2021-09-19 DIAGNOSIS — M546 Pain in thoracic spine: Secondary | ICD-10-CM

## 2021-09-19 DIAGNOSIS — G8929 Other chronic pain: Secondary | ICD-10-CM | POA: Diagnosis not present

## 2021-09-19 DIAGNOSIS — M542 Cervicalgia: Secondary | ICD-10-CM | POA: Diagnosis not present

## 2021-09-19 DIAGNOSIS — M7918 Myalgia, other site: Secondary | ICD-10-CM

## 2021-09-19 NOTE — Progress Notes (Signed)
Marie Farmer - 66 y.o. female MRN [id number]  Date of birth: 01-Jul-1956  Office Visit Note: Visit Date: 09/19/2021 PCP: Glenda Chroman, MD Referred by: Glenda Chroman, MD  Subjective: Chief Complaint  Patient presents with   Neck - Pain   Right Shoulder - Pain   Left Shoulder - Pain   HPI: Marie Farmer is a 66 y.o. female who comes in today for evaluation of chronic, worsening and severe bilateral neck pain radiating down to upper thoracic region. Patient reports pain has been ongoing for several years. Patient relocated to [facility] from [place] in September of 2022. Patient reports pain is exacerbated by movement and activity, describes as a constant soreness, currently rates as 7 out of 10. Patient reports some relief of pain with rest, laying flat and medications. Patient also reports some relief of pain with formal physical therapy, chiropractic treatments and dry needling. Patient has more recently been treated at [facility]. Patient did have MRI of both cervical and thoracic spine performed in 2021 at [facility] [place]. Cervical MRI exhibits right paracentral disc protrusion at C4-C5, left lateral protrusion/subligamentous extrusion and severe left foraminal stenosis at C6-C7. No high grade spinal canal stenosis noted. Thoracic MRI exhibits multi-level discogenic disease, no high grade spinal canal stenosis noted. Patient states she has been dealing with this pain for quite some time and would like longer lasting pain relief. Patient denies focal weakness, numbness and tingling. Patient denies recent trauma or falls.   Patient recently had right ischial bursa injection performed by Dr. Magnus Sinning on 08/30/2021 and reports good and sustained relief from this procedure.    Review of Systems  Musculoskeletal:  Positive for back pain and myalgias.  Neurological:  Negative for tingling, sensory change, focal weakness and weakness.  All  other systems reviewed and are negative. Otherwise per HPI.  Assessment & Plan: Visit Diagnoses:    ICD-10-CM   1. Cervicalgia  M54.2     2. Chronic bilateral thoracic back pain  M54.6    G89.29     3. Myofascial pain syndrome  M79.18 Ambulatory referral to Physical Therapy       Plan: Findings:  Chronic, worsening and severe bilateral neck pain radiating down to upper thoracic region. Patient continues to have excruciating pain despite good conservative therapies such as formal physical therapy, chiropractic treatments and use of medications. Patient's clinical presentation and exam are consistent with myofascial pain vs C4 nerve pattern. We believe the next step is to place a referral with our in house team for manual treatments and dry needling, which we did complete today. If patient does not get good relief with physical therapy treatments we did discuss possibility of performing cervical epidural steroid injection as she does have paracentral disc protrusion at C4-C5. Patient encouraged to remain active. We will follow-up with patient for re-evaluation after she completes regimen of physical therapy. No red flag symptoms noted upon exam today.    Meds & Orders: No orders of the defined types were placed in this encounter.   Orders Placed This Encounter  Procedures   Ambulatory referral to Physical Therapy    Follow-up: Return for follow-up after physical therapy is complete for re-evaluation.   Procedures: No procedures performed      Clinical History: MRI LUMBAR SPINE WITHOUT CONTRAST   FINDINGS: Segmentation:  Transitional S1 vertebral body.   Disc levels:   L1-2: No significant disc displacement, foraminal stenosis, or  canal stenosis.   L2-3: No significant disc displacement, foraminal stenosis, or canal stenosis.   L3-4: No significant disc displacement, foraminal stenosis, or canal stenosis.   L4-5: Mild disc bulge and facet hypertrophy. No  significant foraminal or canal stenosis.   L5-S1: Mild disc bulge and facet hypertrophy. No significant foraminal or canal stenosis.   IMPRESSION: 1. No acute osseous abnormality or malalignment of the lumbar spine. 2. Stable mild lumbar spondylosis with disc and facet degenerative changes at the L4-5 and L5-S1 levels. No significant foraminal or canal stenosis.     Electronically Signed By: Kristine Garbe M.D. On: 02/01/2018 20:03  1. Degenerative disc disease and lower lumbar spondylosis, without impingement identified.  ==========================================================  [place], [place]    CLINICAL INDICATIONS: /M54.16 Radiculopathy, lumbar region  COMPARISON:  HIP WO CONTRAST MRI, LEFT 07/07/2020 6:39 PM   IMPRESSION:  1. Osteitis pubis. Partially imaged lower lumbar spondylosis.  2. Mild left gluteus minimus insertional tendinosis with trace trochanteric  bursal fluid.  3. Bilateral hamstring origin tendinosis.  4. Negative for acute internal derangement of the left hip.   Elmyra Ricks MD On 08/03/2020 08:02:45; [id number] --- PROCEDURE INFORMATION:  Exam: MR Thoracic Spine Without Contrast  Exam date and time: 05/07/2020 12:35 PM    FINDINGS:  Vertebrae:  There is no compression fracture deformity or osseous metastatic  disease.  The facet articulations are maintained.  Spinal cord:  The conus medullaris is normally positioned and there is no  intrathecal mass.  There is no significant spinal stenosis, there is a quite  capacious spinal canal.  There is no myelomalacia or cord edema.  T1-T2: At the T1-2 disc space, there is a 2 mm right lateral protrusion with  minimal attenuation distal lateral recess and proximal foramen.  T2-T3: At the T2-3 disc space, there is a 2 mm right lateral protrusion  attenuating the proximal lateral recess. There is subtle flattening of the  ventral lateral aspect  of the cord without cord displacement.  T3-T4:  No significant disc disease. No significant spinal canal stenosis.  T4-T5: At the T4-5 disc space, there is a 1 mm right lateral protrusion without  central or foraminal stenosis.  T5-T6:  No significant disc disease. No significant spinal canal stenosis.  T6-T7: At the T6-7 disc space, there is a 1.5 mm left paracentral protrusion  without central or foraminal stenosis.  T7-T8: At the T7-8 disc space, there is a 2.3 mm right paracentral  protrusion/subligamentous extrusion partially attenuating the proximal lateral  recess. There is subtle flattening of the ventral cord without cord  displacement. There is a quite capacious spinal canal.  T8-T9:  No significant disc disease. No significant spinal canal stenosis.  T9-T10: At the T9-10 disc space, there is a 2.5 mm left paracentral  subligamentous extrusion with a peripheral annular tear. There is partial  attenuation of proximal lateral recess without compromise of the cord. There is  a quite capacious spinal canal.  T10-T11:  At the T10-11 disc space is a minute Schmorl's node of the inferior  endplate. There is no central or foraminal stenosis.  T11-T12:  No significant disc disease. No significant spinal canal stenosis.  Soft tissues:  There is no pleural effusion or paravertebral mass intensity.    There is a dorsal left renal cyst of 1.1 cm.   IMPRESSION:  Multilevel discogenic disease is present.   The thoracic cord is normal.  There is no intrathecal mass.   Particia Jasper  MD On 05/08/2020  12:27:33; [id number]   -  -    Read by:  Charlestine Night MD  Dictated Date/time:  05/08/20 12:28   She reports that she has never smoked. She has never used smokeless tobacco. No results for input(s): HGBA1C, LABURIC in the last 8760 hours.  Objective:  VS:  HT:     WT:    BMI:      BP:127/76   HR:80bpm   TEMP: ( )   RESP:  Physical Exam Vitals and nursing note reviewed.  HENT:      Head: Normocephalic and atraumatic.     Right Ear: External ear normal.     Left Ear: External ear normal.     Nose: Nose normal.     Mouth/Throat:     Mouth: Mucous membranes are moist.  Eyes:     Extraocular Movements: Extraocular movements intact.  Cardiovascular:     Rate and Rhythm: Normal rate.     Pulses: Normal pulses.  Pulmonary:     Effort: Pulmonary effort is normal.  Abdominal:     General: Abdomen is flat. There is no distension.  Musculoskeletal:        General: Tenderness present.     Cervical back: Tenderness present.     Comments: No discomfort noted with flexion, extension and side-to-side rotation. Patient has good strength in the upper extremities including 5 out of 5 strength in wrist extension, long finger flexion and APB.  There is no atrophy of the hands intrinsically.  Sensation intact bilaterally. Tenderness noted upon palpation of bilateral trapezius, levator scapulae, and rhomboid muscles, multiple trigger points palpated in these areas. Negative Hoffman's sign.   Skin:    General: Skin is warm and dry.     Capillary Refill: Capillary refill takes less than 2 seconds.  Neurological:     General: No focal deficit present.     Mental Status: She is alert and oriented to person, place, and time.  Psychiatric:        Mood and Affect: Mood normal.        Behavior: Behavior normal.    Ortho Exam  Imaging: No results found.  Past Medical/Family/Surgical/Social History: Medications & Allergies reviewed per EMR, new medications updated. Patient Active Problem List   Diagnosis Date Noted   Plantar fasciitis 04/18/2018   Dyspnea on exertion 10/16/2016   Essential hypertension 10/16/2016   Morbid obesity due to excess calories ([place]) 10/16/2016   IBS (irritable bowel syndrome) 02/29/2012   Tibial plateau fracture 50/53/9767   HELICOBACTER PYLORI INFECTION 08/25/2009   DEPRESSION 08/25/2009   ASTHMA 08/25/2009   GASTROESOPHAGEAL REFLUX DISEASE,  CHRONIC 08/25/2009   ALLERGY 08/25/2009   Cholelithiasis 08/25/2009   Past Medical History:  Diagnosis Date   Anxiety    Asthma    DDD (degenerative disc disease)    chronic back pain   Fibromyalgia    Foot pain    GERD (gastroesophageal reflux disease)    Hypertension    IBS (irritable bowel syndrome)    Mood disorder (HCC)    PONV (postoperative nausea and vomiting)    Sleep apnea    Venous insufficiency    Wears glasses    Family History  Problem Relation Age of Onset   Diabetes Mother    Coronary artery disease Mother    COPD Mother    Colon polyps Mother        age 79   Lung cancer Father    Past Surgical  History:  Procedure Laterality Date   CARPAL TUNNEL RELEASE Right 06/10/2013   Procedure: CARPAL TUNNEL RELEASE;  Surgeon: Wynonia Sours, MD;  Location: [facility];  Service: Orthopedics;  Laterality: Right;   CHOLECYSTECTOMY N/A 08/19/2021   Procedure: LAPAROSCOPIC CHOLECYSTECTOMY;  Surgeon: Virl Cagey, MD;  Location: [facility];  Service: General;  Laterality: N/A;   COLONOSCOPY  12/20/2011   [name]-single diminutive sessile polyp in the sigmoid colon/small interenal hemorrhoids   COLONOSCOPY N/A 11/28/2017   Procedure: COLONOSCOPY;  Surgeon: Daneil Dolin, MD;  Location: [facility];  Service: Endoscopy;  Laterality: N/A;  10:30   COLONOSCOPY WITH PROPOFOL N/A 07/07/2021   Procedure: COLONOSCOPY WITH PROPOFOL;  Surgeon: Daneil Dolin, MD;  Location: [facility];  Service: Endoscopy;  Laterality: N/A;  12:15pm   ESOPHAGOGASTRODUODENOSCOPY  04/10/2003   Normal esophagus/Antral erosions, as described above.  The remainder of the stomach appeared normal.  Normal first and second parts of the duodenum.   ESOPHAGOGASTRODUODENOSCOPY  12/20/2011   [name]-hiatus hernia/chronic gastritis/normal duodenumNEGATIVE H pylori, negative SB biopsy   POLYPECTOMY  07/07/2021   Procedure: POLYPECTOMY;  Surgeon: Daneil Dolin, MD;  Location: [facility];  Service: Endoscopy;;   TIBIA FRACTURE SURGERY  09/11/2010   right   Social History   Occupational History   Occupation: unemployed  Tobacco Use   Smoking status: Never   Smokeless tobacco: Never  Substance and Sexual Activity   Alcohol use: Yes    Comment: socially, couple times per week   Drug use: No    Types: Marijuana    Comment: remote [place]   Sexual activity: Never    Birth control/protection: None

## 2021-09-19 NOTE — Progress Notes (Signed)
Pt neck pain that travels both shoulders. Pt state don't know what cause the pain. Pt state she takes pain meds to help ease her pain.  Numeric Pain Rating Scale and Functional Assessment Average Pain 8 Pain Right Now 1 My pain is intermittent, burning, dull, tingling, and aching Pain is worse with: some activites Pain improves with: medication   In the last MONTH (on 0-10 scale) has pain interfered with the following?  1. General activity like being  able to carry out your everyday physical activities such as walking, climbing stairs, carrying groceries, or moving a chair?  Rating(7)  2. Relation with others like being able to carry out your usual social activities and roles such as  activities at home, at work and in your community. Rating(8)  3. Enjoyment of life such that you have  been bothered by emotional problems such as feeling anxious, depressed or irritable?  Rating(9)

## 2021-09-23 MED ORDER — BUPIVACAINE HCL 0.25 % IJ SOLN
4.0000 mL | INTRAMUSCULAR | Status: AC | PRN
  Administered 2021-08-30: 4 mL via INTRA_ARTICULAR

## 2021-09-23 MED ORDER — TRIAMCINOLONE ACETONIDE 40 MG/ML IJ SUSP
40.0000 mg | INTRAMUSCULAR | Status: AC | PRN
  Administered 2021-08-30: 40 mg via INTRA_ARTICULAR

## 2021-09-23 NOTE — Progress Notes (Signed)
Marie Farmer - 66 y.o. female MRN [id number]  Date of birth: 05/26/1956  Office Visit Note: Visit Date: 08/30/2021 PCP: Glenda Chroman, MD Referred by: Glenda Chroman, MD  Subjective: Chief Complaint  Patient presents with   Right Hip - Pain   HPI:  Marie Farmer is a 66 y.o. female who comes in today at the request of Barnet Pall, FNP for planned Right ischial bursa injection with fluoroscopic guidance.  The patient has failed conservative care including home exercise, medications, time and activity modification.  This injection will be diagnostic and hopefully therapeutic.  Please see requesting physician notes for further details and justification.  ROS Otherwise per HPI.  Assessment & Plan: Visit Diagnoses:    ICD-10-CM   1. Ischial bursitis, right  M70.71 XR C-ARM NO REPORT      Plan: No additional findings.   Meds & Orders: No orders of the defined types were placed in this encounter.   Orders Placed This Encounter  Procedures   Large Joint Inj   XR C-ARM NO REPORT    Follow-up: Return if symptoms worsen or fail to improve.   Procedures: Right Ischial Bursa on 08/30/2021 10:00 AM Indications: pain and diagnostic evaluation Details: 22 G 3.5 in needle, fluoroscopy-guided posterior approach  Arthrogram: No  Medications: 4 mL bupivacaine 0.25 %; 40 mg triamcinolone acetonide 40 MG/ML Outcome: tolerated well, no immediate complications  There was excellent flow of contrast outlining the ischial bursa.  There was no vascular flow noted. Procedure, treatment alternatives, risks and benefits explained, specific risks discussed. Consent was given by the patient. Immediately prior to procedure a time out was called to verify the correct patient, procedure, equipment, support staff and site/side marked as required. Patient was prepped and draped in the usual sterile fashion.         Clinical History: MRI LUMBAR SPINE WITHOUT CONTRAST   FINDINGS: Segmentation:   Transitional S1 vertebral body.   Disc levels:   L1-2: No significant disc displacement, foraminal stenosis, or canal stenosis.   L2-3: No significant disc displacement, foraminal stenosis, or canal stenosis.   L3-4: No significant disc displacement, foraminal stenosis, or canal stenosis.   L4-5: Mild disc bulge and facet hypertrophy. No significant foraminal or canal stenosis.   L5-S1: Mild disc bulge and facet hypertrophy. No significant foraminal or canal stenosis.   IMPRESSION: 1. No acute osseous abnormality or malalignment of the lumbar spine. 2. Stable mild lumbar spondylosis with disc and facet degenerative changes at the L4-5 and L5-S1 levels. No significant foraminal or canal stenosis.     Electronically Signed By: Kristine Garbe M.D. On: 02/01/2018 20:03  1. Degenerative disc disease and lower lumbar spondylosis, without impingement identified.  ==========================================================  [place], [place]    CLINICAL INDICATIONS: /M54.16 Radiculopathy, lumbar region  COMPARISON:  HIP WO CONTRAST MRI, LEFT 07/07/2020 6:39 PM   IMPRESSION:  1. Osteitis pubis. Partially imaged lower lumbar spondylosis.  2. Mild left gluteus minimus insertional tendinosis with trace trochanteric  bursal fluid.  3. Bilateral hamstring origin tendinosis.  4. Negative for acute internal derangement of the left hip.   Elmyra Ricks MD On 08/03/2020 08:02:45; [id number] --- PROCEDURE INFORMATION:  Exam: MR Thoracic Spine Without Contrast  Exam date and time: 05/07/2020 12:35 PM    FINDINGS:  Vertebrae:  There is no compression fracture deformity or osseous metastatic  disease.  The facet articulations are maintained.  Spinal cord:  The conus medullaris is normally  positioned and there is no  intrathecal mass.  There is no significant spinal stenosis, there is a quite  capacious spinal canal.  There is  no myelomalacia or cord edema.  T1-T2: At the T1-2 disc space, there is a 2 mm right lateral protrusion with  minimal attenuation distal lateral recess and proximal foramen.  T2-T3: At the T2-3 disc space, there is a 2 mm right lateral protrusion  attenuating the proximal lateral recess. There is subtle flattening of the  ventral lateral aspect of the cord without cord displacement.  T3-T4:  No significant disc disease. No significant spinal canal stenosis.  T4-T5: At the T4-5 disc space, there is a 1 mm right lateral protrusion without  central or foraminal stenosis.  T5-T6:  No significant disc disease. No significant spinal canal stenosis.  T6-T7: At the T6-7 disc space, there is a 1.5 mm left paracentral protrusion  without central or foraminal stenosis.  T7-T8: At the T7-8 disc space, there is a 2.3 mm right paracentral  protrusion/subligamentous extrusion partially attenuating the proximal lateral  recess. There is subtle flattening of the ventral cord without cord  displacement. There is a quite capacious spinal canal.  T8-T9:  No significant disc disease. No significant spinal canal stenosis.  T9-T10: At the T9-10 disc space, there is a 2.5 mm left paracentral  subligamentous extrusion with a peripheral annular tear. There is partial  attenuation of proximal lateral recess without compromise of the cord. There is  a quite capacious spinal canal.  T10-T11:  At the T10-11 disc space is a minute Schmorl's node of the inferior  endplate. There is no central or foraminal stenosis.  T11-T12:  No significant disc disease. No significant spinal canal stenosis.  Soft tissues:  There is no pleural effusion or paravertebral mass intensity.    There is a dorsal left renal cyst of 1.1 cm.   IMPRESSION:  Multilevel discogenic disease is present.   The thoracic cord is normal.  There is no intrathecal mass.   Particia Jasper MD On 05/08/2020  12:27:33; [id number]   -  -    Read by:   Charlestine Night MD  Dictated Date/time:  05/08/20 12:28     Objective:  VS:  HT:     WT:    BMI:      BP:    HR: bpm   TEMP: ( )   RESP:  Physical Exam   Imaging: No results found.

## 2021-09-27 ENCOUNTER — Encounter (HOSPITAL_COMMUNITY): Payer: Self-pay | Admitting: Physical Therapy

## 2021-09-27 ENCOUNTER — Ambulatory Visit (HOSPITAL_COMMUNITY): Payer: Medicare HMO | Attending: Physical Medicine and Rehabilitation | Admitting: Physical Therapy

## 2021-09-27 ENCOUNTER — Other Ambulatory Visit: Payer: Self-pay

## 2021-09-27 DIAGNOSIS — M546 Pain in thoracic spine: Secondary | ICD-10-CM | POA: Insufficient documentation

## 2021-09-27 DIAGNOSIS — R293 Abnormal posture: Secondary | ICD-10-CM | POA: Insufficient documentation

## 2021-09-27 DIAGNOSIS — M7918 Myalgia, other site: Secondary | ICD-10-CM | POA: Insufficient documentation

## 2021-09-27 DIAGNOSIS — M542 Cervicalgia: Secondary | ICD-10-CM | POA: Diagnosis not present

## 2021-09-27 NOTE — Patient Instructions (Addendum)
Trigger Point Dry Needling  What is Trigger Point Dry Needling (DN)? DN is a physical therapy technique used to treat muscle pain and dysfunction. Specifically, DN helps deactivate muscle trigger points (muscle knots).  A thin filiform needle is used to penetrate the skin and stimulate the underlying trigger point. The goal is for a local twitch response (LTR) to occur and for the trigger point to relax. No medication of any kind is injected during the procedure.   What Does Trigger Point Dry Needling Feel Like?  The procedure feels different for each individual patient. Some patients report that they do not actually feel the needle enter the skin and overall the process is not painful. Very mild bleeding may occur. However, many patients feel a deep cramping in the muscle in which the needle was inserted. This is the local twitch response.   How Will I feel after the treatment? Soreness is normal, and the onset of soreness may not occur for a few hours. Typically this soreness does not last longer than two days.  Bruising is uncommon, however; ice can be used to decrease any possible bruising.  In rare cases feeling tired or nauseous after the treatment is normal. In addition, your symptoms may get worse before they get better, this period will typically not last longer than 24 hours.   What Can I do After My Treatment? Increase your hydration by drinking more water for the next 24 hours. You may place ice or heat on the areas treated that have become sore, however, do not use heat on inflamed or bruised areas. Heat often brings more relief post needling. You can continue your regular activities, but vigorous activity is not recommended initially after the treatment for 24 hours. DN is best combined with other physical therapy such as strengthening, stretching, and other therapies.   Access Code: [email or web address] URL: [email or web address] Date: 09/27/2021 Prepared by: Josue Hector  Exercises Seated Scapular Retraction - 2-3 x daily - 7 x weekly - 2 sets - 10 reps - 3-5 second hold Seated Cervical Retraction - 2-3 x daily - 7 x weekly - 2 sets - 10 reps - 3-5 second hold Seated Upper Trapezius Stretch - 2-3 x daily - 7 x weekly - 1 sets - 3 reps - 20 second hold

## 2021-09-27 NOTE — Therapy (Signed)
[place], [place], [address] Phone: [phone]   Fax:  [phone]  Physical Therapy Evaluation  Patient Details  Name: Marie Farmer MRN: [id number] Date of Birth: April 06, 1956 Referring Provider (PT): Barnet Pall NP   Encounter Date: 09/27/2021   PT End of Session - 09/27/21 0919     Visit Number 1    Number of Visits 12    Date for PT Re-Evaluation 11/08/21    Authorization Type [employer]    Authorization Time Period check auth    Progress Note Due on Visit 10    PT Start Time 0848    PT Stop Time 0930    PT Time Calculation (min) 42 min    Activity Tolerance Patient tolerated treatment well    Behavior During Therapy [facility] for tasks assessed/performed             Past Medical History:  Diagnosis Date   Anxiety    Asthma    DDD (degenerative disc disease)    chronic back pain   Fibromyalgia    Foot pain    GERD (gastroesophageal reflux disease)    Hypertension    IBS (irritable bowel syndrome)    Mood disorder (HCC)    PONV (postoperative nausea and vomiting)    Sleep apnea    Venous insufficiency    Wears glasses     Past Surgical History:  Procedure Laterality Date   CARPAL TUNNEL RELEASE Right 06/10/2013   Procedure: CARPAL TUNNEL RELEASE;  Surgeon: Wynonia Sours, MD;  Location: [place];  Service: Orthopedics;  Laterality: Right;   CHOLECYSTECTOMY N/A 08/19/2021   Procedure: LAPAROSCOPIC CHOLECYSTECTOMY;  Surgeon: Virl Cagey, MD;  [facility];  Service: General;  Laterality: N/A;   COLONOSCOPY  12/20/2011   [name]-single diminutive sessile polyp in the sigmoid colon/small interenal hemorrhoids   COLONOSCOPY N/A 11/28/2017   Procedure: COLONOSCOPY;  Surgeon: Daneil Dolin, MD;  [facility];  Service: Endoscopy;  Laterality: N/A;  10:30   COLONOSCOPY WITH PROPOFOL N/A 07/07/2021   Procedure: COLONOSCOPY WITH PROPOFOL;  Surgeon: Daneil Dolin, MD;  [facility];  Service: Endoscopy;  Laterality: N/A;  12:15pm   ESOPHAGOGASTRODUODENOSCOPY  04/10/2003   Normal esophagus/Antral erosions, as described above.  The remainder of the stomach appeared normal.  Normal first and second parts of the duodenum.   ESOPHAGOGASTRODUODENOSCOPY  12/20/2011   [name]-hiatus hernia/chronic gastritis/normal duodenumNEGATIVE H pylori, negative SB biopsy   POLYPECTOMY  07/07/2021   Procedure: POLYPECTOMY;  Surgeon: Daneil Dolin, MD;  [facility];  Service: Endoscopy;;   TIBIA FRACTURE SURGERY  09/11/2010   right    There were no vitals filed for this visit.    Subjective Assessment - 09/27/21 0855     Subjective Patient presents to therapy with complaint of neck and mid/ upper back pain. She reports pain is chronic in nature. She denies any previous treatments, though she has had therapy in the past for other issues. She reports she has been told by one MD that she has spinal stenosis, though another MD told her she does not.    Limitations House hold activities;Sitting    Patient Stated Goals Decreased pain    Currently in Pain? No/denies                [facility] PT Assessment - 09/27/21 0001       Assessment   Medical  Diagnosis Myofascial pain syndrome    Referring Provider (PT) Barnet Pall NP    Onset Date/Surgical Date --   Chronic   Prior Therapy Yes for low back      Precautions   Precautions None      Restrictions   Weight Bearing Restrictions No      Balance Screen   Has the patient fallen in the past 6 months Yes    How many times? 1    Has the patient had a decrease in activity level because of a fear of falling?  No    Is the patient reluctant to leave their home because of a fear of falling?  No      Home Ecologist residence      Prior Function   Level of Independence Independent      Cognition   Overall Cognitive Status Within Functional  Limits for tasks assessed      Observation/Other Assessments   Focus on Therapeutic Outcomes (FOTO)  66% function      Posture/Postural Control   Posture/Postural Control Postural limitations    Postural Limitations Rounded Shoulders;Forward head      ROM / Strength   AROM / PROM / Strength AROM;Strength      AROM   AROM Assessment Site Cervical    Cervical Flexion 50    Cervical Extension 35    Cervical - Right Rotation 58    Cervical - Left Rotation 60      Strength   Strength Assessment Site Shoulder    Right/Left Shoulder Right;Left    Right Shoulder Flexion 4+/5    Right Shoulder External Rotation 4+/5    Left Shoulder Flexion 4+/5    Left Shoulder External Rotation 4+/5      Flexibility   Soft Tissue Assessment /Muscle Length --   mod restriction in bilateral upper trap     Palpation   Palpation comment Mod TTP about bilateral upper trap and medial scapular borders/ rhomboids                        Objective measurements completed on examination: See above findings.       [place] Adult PT Treatment/Exercise - 09/27/21 0001       Exercises   Exercises Shoulder      Shoulder Exercises: Seated   Other Seated Exercises chin tuck x 10, scap retraction x10                     PT Education - 09/27/21 0855     Education Details on evaluation findings, POC and HEP    Person(s) Educated Patient    Methods Explanation;Handout    Comprehension Verbalized understanding              PT Short Term Goals - 09/27/21 0926       PT SHORT TERM GOAL #1   Title Patient will be independent with initial HEP and self-management strategies to improve functional outcomes    Time 3    Period Weeks    Status New    Target Date 10/18/21               PT Long Term Goals - 09/27/21 0926       PT LONG TERM GOAL #1   Title Patient will be independent with advanced HEP and self-management strategies to improve functional outcomes    Time  6  Period Weeks    Status New    Target Date 11/08/21      PT LONG TERM GOAL #2   Title Patient will report at least 65% overall improvement in subjective complaint to indicate improvement in ability to perform ADLs.    Time 6    Period Weeks    Status New    Target Date 11/08/21      PT LONG TERM GOAL #3   Title Patient will improve FOTO score to predicted value to indicate improvement in functional outcomes    Time 6    Period Weeks    Status New    Target Date 11/08/21                    Plan - 09/27/21 9935     Clinical Impression Statement Patient is a 65 y.o. female who presents to physical therapy with complaint of neck and upper back pain. Patient demonstrates reduced flexibility, increased tenderness to palpation and postural abnormalities which are likely contributing to symptoms of pain and are negatively impacting patient ability to perform ADLs. Patient will benefit from skilled physical therapy services to address these deficits to reduce pain and improve level of function with ADLs.    Examination-Activity Limitations Reach Overhead;Lift;Sit;Carry    Examination-Participation Restrictions Cleaning;Community Activity;Shop;Meal Prep;Laundry;Yard Work    Stability/Clinical Decision Making Stable/Uncomplicated    Designer, jewellery Low    Rehab Potential Good    PT Frequency 2x / week    PT Duration 6 weeks    PT Treatment/Interventions ADLs/Self Care Home Management;Fluidtherapy;Parrafin;Ultrasound;Neuromuscular re-education;Scar mobilization;Contrast Bath;DME Instruction;Biofeedback;Stair training;Cryotherapy;Electrical Stimulation;Therapeutic activities;Patient/family education;Passive range of motion;Spinal Manipulations;Visual/perceptual remediation/compensation;Joint Manipulations;Dry needling;Energy conservation;Orthotic Fit/Training;Splinting;Taping;Vasopneumatic Device;Manual techniques;Therapeutic exercise;Iontophoresis 4mg /ml Dexamethasone;Moist  Heat;Traction;Balance training;Manual lymph drainage    PT Next Visit Plan Review HEP. Progress posturing and scapular strengthening as tolerated. Manual STM as indicated and possible DN    PT Home Exercise Plan Eval: scap retraction, chin tuck    Consulted and Agree with Plan of Care Patient             Patient will benefit from skilled therapeutic intervention in order to improve the following deficits and impairments:  Pain, Impaired UE functional use, Increased fascial restricitons, Decreased activity tolerance, Improper body mechanics, Impaired flexibility, Postural dysfunction  Visit Diagnosis: Cervicalgia - Plan: PT plan of care cert/re-cert  Pain in thoracic spine - Plan: PT plan of care cert/re-cert  Abnormal posture - Plan: PT plan of care cert/re-cert     Problem List Patient Active Problem List   Diagnosis Date Noted   Plantar fasciitis 04/18/2018   Dyspnea on exertion 10/16/2016   Essential hypertension 10/16/2016   Morbid obesity due to excess calories ([place]) 10/16/2016   IBS (irritable bowel syndrome) 02/29/2012   Tibial plateau fracture 70/17/7939   HELICOBACTER PYLORI INFECTION 08/25/2009   DEPRESSION 08/25/2009   ASTHMA 08/25/2009   GASTROESOPHAGEAL REFLUX DISEASE, CHRONIC 08/25/2009   ALLERGY 08/25/2009   Cholelithiasis 08/25/2009   9:37 AM, 09/27/21 Josue Hector PT DPT  Physical Therapist with [place]  [facility]  ([phone]) [phone] [place] [address] [place], [place], [address] Phone: [phone]   Fax:  [phone]  Name: Marie Farmer MRN: [id number] Date of Birth: [birth year]

## 2021-09-29 ENCOUNTER — Ambulatory Visit (HOSPITAL_COMMUNITY): Payer: Medicare HMO | Admitting: Physical Therapy

## 2021-10-03 DIAGNOSIS — Z789 Other specified health status: Secondary | ICD-10-CM | POA: Diagnosis not present

## 2021-10-03 DIAGNOSIS — Z6834 Body mass index (BMI) 34.0-34.9, adult: Secondary | ICD-10-CM | POA: Diagnosis not present

## 2021-10-03 DIAGNOSIS — M069 Rheumatoid arthritis, unspecified: Secondary | ICD-10-CM | POA: Diagnosis not present

## 2021-10-03 DIAGNOSIS — I10 Essential (primary) hypertension: Secondary | ICD-10-CM | POA: Diagnosis not present

## 2021-10-03 DIAGNOSIS — C44629 Squamous cell carcinoma of skin of left upper limb, including shoulder: Secondary | ICD-10-CM | POA: Diagnosis not present

## 2021-10-03 DIAGNOSIS — D485 Neoplasm of uncertain behavior of skin: Secondary | ICD-10-CM | POA: Diagnosis not present

## 2021-10-03 DIAGNOSIS — Z299 Encounter for prophylactic measures, unspecified: Secondary | ICD-10-CM | POA: Diagnosis not present

## 2021-10-03 DIAGNOSIS — R413 Other amnesia: Secondary | ICD-10-CM | POA: Diagnosis not present

## 2021-10-05 ENCOUNTER — Encounter (HOSPITAL_COMMUNITY): Payer: Self-pay | Admitting: Physical Therapy

## 2021-10-05 ENCOUNTER — Other Ambulatory Visit: Payer: Self-pay

## 2021-10-05 ENCOUNTER — Ambulatory Visit (HOSPITAL_COMMUNITY): Payer: Medicare HMO | Admitting: Physical Therapy

## 2021-10-05 DIAGNOSIS — M542 Cervicalgia: Secondary | ICD-10-CM

## 2021-10-05 DIAGNOSIS — M7918 Myalgia, other site: Secondary | ICD-10-CM | POA: Diagnosis not present

## 2021-10-05 DIAGNOSIS — M546 Pain in thoracic spine: Secondary | ICD-10-CM

## 2021-10-05 DIAGNOSIS — R293 Abnormal posture: Secondary | ICD-10-CM

## 2021-10-05 NOTE — Patient Instructions (Signed)
Access Code: [email or web address] URL: [email or web address] Date: 10/05/2021 Prepared by: Mitzi Hansen Moriya Mitchell  Exercises Seated Upper Trapezius Stretch - 1 x daily - 7 x weekly - 3 reps - 20-30 second hold Standing Shoulder and Trunk Flexion at Table - 1 x daily - 7 x weekly - 3-5 reps - 20 second hold

## 2021-10-05 NOTE — Therapy (Signed)
[place], [place], [address] Phone: [phone]   Fax:  [phone]  Physical Therapy Treatment  Patient Details  Name: Marie Farmer MRN: [id number] Date of Birth: 20-Jul-1956 Referring Provider (PT): Barnet Pall NP   Encounter Date: 10/05/2021   PT End of Session - 10/05/21 1404     Visit Number 2    Number of Visits 12    Date for PT Re-Evaluation 11/08/21    Authorization Type [employer]    Authorization Time Period 12 visits approved 1/17 - 2/28    Authorization - Visit Number 2    Authorization - Number of Visits 12    Progress Note Due on Visit 10    PT Start Time 5027    PT Stop Time 1443    PT Time Calculation (min) 38 min    Activity Tolerance Patient tolerated treatment well    Behavior During Therapy WFL for tasks assessed/performed             Past Medical History:  Diagnosis Date   Anxiety    Asthma    DDD (degenerative disc disease)    chronic back pain   Fibromyalgia    Foot pain    GERD (gastroesophageal reflux disease)    Hypertension    IBS (irritable bowel syndrome)    Mood disorder (HCC)    PONV (postoperative nausea and vomiting)    Sleep apnea    Venous insufficiency    Wears glasses     Past Surgical History:  Procedure Laterality Date   CARPAL TUNNEL RELEASE Right 06/10/2013   Procedure: CARPAL TUNNEL RELEASE;  Surgeon: Wynonia Sours, MD;  Location: [place];  Service: Orthopedics;  Laterality: Right;   CHOLECYSTECTOMY N/A 08/19/2021   Procedure: LAPAROSCOPIC CHOLECYSTECTOMY;  Surgeon: Virl Cagey, MD;  [facility];  Service: General;  Laterality: N/A;   COLONOSCOPY  12/20/2011   [name]-single diminutive sessile polyp in the sigmoid colon/small interenal hemorrhoids   COLONOSCOPY N/A 11/28/2017   Procedure: COLONOSCOPY;  Surgeon: Daneil Dolin, MD;  [facility];  Service: Endoscopy;  Laterality: N/A;  10:30    COLONOSCOPY WITH PROPOFOL N/A 07/07/2021   Procedure: COLONOSCOPY WITH PROPOFOL;  Surgeon: Daneil Dolin, MD;  [facility];  Service: Endoscopy;  Laterality: N/A;  12:15pm   ESOPHAGOGASTRODUODENOSCOPY  04/10/2003   Normal esophagus/Antral erosions, as described above.  The remainder of the stomach appeared normal.  Normal first and second parts of the duodenum.   ESOPHAGOGASTRODUODENOSCOPY  12/20/2011   [name]-hiatus hernia/chronic gastritis/normal duodenumNEGATIVE H pylori, negative SB biopsy   POLYPECTOMY  07/07/2021   Procedure: POLYPECTOMY;  Surgeon: Daneil Dolin, MD;  [facility];  Service: Endoscopy;;   TIBIA FRACTURE SURGERY  09/11/2010   right    There were no vitals filed for this visit.   Subjective Assessment - 10/05/21 1406     Subjective Continues to have pain. Had neck pain when sitting in recliner. Has L thoracic region pain. Pain in all neck/upper back. could not due to 2 sets of scap ret due to pain.    Limitations House hold activities;Sitting    Patient Stated Goals Decreased pain    Currently in Pain? Yes    Pain Score 3     Pain Location Neck    Pain Descriptors / Indicators Aching;Burning   stinging   Pain Type Chronic pain  [place] Adult PT Treatment/Exercise - 10/05/21 0001       Shoulder Exercises: Seated   Other Seated Exercises chin tuck x 10, scap retraction x10; UT stretch 3 x 20 second holds bilateral    Other Seated Exercises t/sp extension over chair 10 x 5 second      Shoulder Exercises: Standing   Other Standing Exercises trunk flexion stretch at counter for t/sp ext and lat strech 5 x 20 second holds      Manual Therapy   Manual Therapy Soft tissue mobilization    Manual therapy comments completed independent from all other aspects of treatment    Soft tissue mobilization IASTM with percussion gun level 10                     PT Education -  10/05/21 1405     Education Details HEP    Person(s) Educated Patient    Methods Explanation;Demonstration    Comprehension Verbalized understanding;Returned demonstration              PT Short Term Goals - 09/27/21 0926       PT SHORT TERM GOAL #1   Title Patient will be independent with initial HEP and self-management strategies to improve functional outcomes    Time 3    Period Weeks    Status New    Target Date 10/18/21               PT Long Term Goals - 09/27/21 0926       PT LONG TERM GOAL #1   Title Patient will be independent with advanced HEP and self-management strategies to improve functional outcomes    Time 6    Period Weeks    Status New    Target Date 11/08/21      PT LONG TERM GOAL #2   Title Patient will report at least 65% overall improvement in subjective complaint to indicate improvement in ability to perform ADLs.    Time 6    Period Weeks    Status New    Target Date 11/08/21      PT LONG TERM GOAL #3   Title Patient will improve FOTO score to predicted value to indicate improvement in functional outcomes    Time 6    Period Weeks    Status New    Target Date 11/08/21                   Plan - 10/05/21 1405     Clinical Impression Statement Educated patient on HEP for posture vs stretching exercise she was performing at home. Completed cervical and thoracic mobility exercises as well as postural strengthening with no change in symptoms following. Ended session with IASTM with percussion gun to decrease tension in periscapular region and thoracic paraspinals with reduction in symptoms following. Patient will continue to benefit from skilled physical therapy in order to reduce impairment and improve function.    Examination-Activity Limitations Reach Overhead;Lift;Sit;Carry    Examination-Participation Restrictions Cleaning;Community Activity;Shop;Meal Prep;Laundry;Yard Work    Stability/Clinical Decision Making  Stable/Uncomplicated    Rehab Potential Good    PT Frequency 2x / week    PT Duration 6 weeks    PT Treatment/Interventions ADLs/Self Care Home Management;Fluidtherapy;Parrafin;Ultrasound;Neuromuscular re-education;Scar mobilization;Contrast Bath;DME Instruction;Biofeedback;Stair training;Cryotherapy;Electrical Stimulation;Therapeutic activities;Patient/family education;Passive range of motion;Spinal Manipulations;Visual/perceptual remediation/compensation;Joint Manipulations;Dry needling;Energy conservation;Orthotic Fit/Training;Splinting;Taping;Vasopneumatic Device;Manual techniques;Therapeutic exercise;Iontophoresis 32m/ml Dexamethasone;Moist Heat;Traction;Balance training;Manual lymph drainage    PT Next Visit Plan Review HEP. Progress posturing and  scapular strengthening as tolerated. Manual STM as indicated and possible DN; Transition into strength program when able so patient can return to gym    PT Home Exercise Plan Eval: scap retraction, chin tuck, 1/25 standing trunk flexion/thoracic extension stretch at counter, UT stretch    Consulted and Agree with Plan of Care Patient             Patient will benefit from skilled therapeutic intervention in order to improve the following deficits and impairments:  Pain, Impaired UE functional use, Increased fascial restricitons, Decreased activity tolerance, Improper body mechanics, Impaired flexibility, Postural dysfunction  Visit Diagnosis: Cervicalgia  Pain in thoracic spine  Abnormal posture     Problem List Patient Active Problem List   Diagnosis Date Noted   Plantar fasciitis 04/18/2018   Dyspnea on exertion 10/16/2016   Essential hypertension 10/16/2016   Morbid obesity due to excess calories ([place]) 10/16/2016   IBS (irritable bowel syndrome) 02/29/2012   Tibial plateau fracture 91/69/4503   HELICOBACTER PYLORI INFECTION 08/25/2009   DEPRESSION 08/25/2009   ASTHMA 08/25/2009   GASTROESOPHAGEAL REFLUX DISEASE, CHRONIC  08/25/2009   ALLERGY 08/25/2009   Cholelithiasis 08/25/2009   2:51 PM, 10/05/21 Mearl Latin PT, DPT Physical Therapist at [place], [place], [address] Phone: [phone]   Fax:  [phone]  Name: MAYAH URQUIDI MRN: [id number] Date of Birth: 08-06-1956

## 2021-10-06 DIAGNOSIS — M47814 Spondylosis without myelopathy or radiculopathy, thoracic region: Secondary | ICD-10-CM | POA: Diagnosis not present

## 2021-10-06 DIAGNOSIS — M9903 Segmental and somatic dysfunction of lumbar region: Secondary | ICD-10-CM | POA: Diagnosis not present

## 2021-10-06 DIAGNOSIS — M47816 Spondylosis without myelopathy or radiculopathy, lumbar region: Secondary | ICD-10-CM | POA: Diagnosis not present

## 2021-10-06 DIAGNOSIS — M531 Cervicobrachial syndrome: Secondary | ICD-10-CM | POA: Diagnosis not present

## 2021-10-06 DIAGNOSIS — M47812 Spondylosis without myelopathy or radiculopathy, cervical region: Secondary | ICD-10-CM | POA: Diagnosis not present

## 2021-10-06 DIAGNOSIS — M9902 Segmental and somatic dysfunction of thoracic region: Secondary | ICD-10-CM | POA: Diagnosis not present

## 2021-10-06 DIAGNOSIS — M9904 Segmental and somatic dysfunction of sacral region: Secondary | ICD-10-CM | POA: Diagnosis not present

## 2021-10-07 ENCOUNTER — Ambulatory Visit (HOSPITAL_COMMUNITY): Payer: Medicare HMO | Admitting: Physical Therapy

## 2021-10-07 ENCOUNTER — Encounter (HOSPITAL_COMMUNITY): Payer: Self-pay | Admitting: Physical Therapy

## 2021-10-07 ENCOUNTER — Other Ambulatory Visit: Payer: Self-pay

## 2021-10-07 DIAGNOSIS — R293 Abnormal posture: Secondary | ICD-10-CM | POA: Diagnosis not present

## 2021-10-07 DIAGNOSIS — M546 Pain in thoracic spine: Secondary | ICD-10-CM | POA: Diagnosis not present

## 2021-10-07 DIAGNOSIS — M542 Cervicalgia: Secondary | ICD-10-CM | POA: Diagnosis not present

## 2021-10-07 DIAGNOSIS — M7918 Myalgia, other site: Secondary | ICD-10-CM | POA: Diagnosis not present

## 2021-10-07 NOTE — Patient Instructions (Signed)
Access Code: [email or web address] URL: [email or web address] Date: 10/07/2021 Prepared by: Josue Hector  Exercises Seated Thoracic Lumbar Extension with Pectoralis Stretch - 2-3 x daily - 7 x weekly - 1 sets - 10 reps - 3-5 second hold Seated Trunk Rotation - Hands on Shoulders - 2-3 x daily - 7 x weekly - 1 sets - 10 reps - 3-5 second hold Standing Shoulder Row with Anchored Resistance - 2-3 x daily - 7 x weekly - 2 sets - 10 reps Seated Upper Trapezius Stretch - 2-3 x daily - 7 x weekly - 1 sets - 3 reps - 20 second hold

## 2021-10-07 NOTE — Therapy (Signed)
[facility] [place], [place], [address] Phone: [phone]   Fax:  [phone]  Physical Therapy Treatment  Patient Details  Name: Marie Farmer MRN: [id number] Date of Birth: 1955/10/10 Referring Provider (PT): Barnet Pall NP   Encounter Date: 10/07/2021   PT End of Session - 10/07/21 1036     Visit Number 3    Number of Visits 12    Date for PT Re-Evaluation 11/08/21    Authorization Type [employer]    Authorization Time Period 12 visits approved 1/17 - 2/28    Authorization - Visit Number 3    Authorization - Number of Visits 12    Progress Note Due on Visit 10    PT Start Time 1032    PT Stop Time 1115    PT Time Calculation (min) 43 min    Activity Tolerance Patient tolerated treatment well    Behavior During Therapy WFL for tasks assessed/performed             Past Medical History:  Diagnosis Date   Anxiety    Asthma    DDD (degenerative disc disease)    chronic back pain   Fibromyalgia    Foot pain    GERD (gastroesophageal reflux disease)    Hypertension    IBS (irritable bowel syndrome)    Mood disorder (HCC)    PONV (postoperative nausea and vomiting)    Sleep apnea    Venous insufficiency    Wears glasses     Past Surgical History:  Procedure Laterality Date   CARPAL TUNNEL RELEASE Right 06/10/2013   Procedure: CARPAL TUNNEL RELEASE;  Surgeon: Wynonia Sours, MD;  Location: [facility];  Service: Orthopedics;  Laterality: Right;   CHOLECYSTECTOMY N/A 08/19/2021   Procedure: LAPAROSCOPIC CHOLECYSTECTOMY;  Surgeon: Virl Cagey, MD;  [facility];  Service: General;  Laterality: N/A;   COLONOSCOPY  12/20/2011   [name]-single diminutive sessile polyp in the sigmoid colon/small interenal hemorrhoids   COLONOSCOPY N/A 11/28/2017   Procedure: COLONOSCOPY;  Surgeon: Daneil Dolin, MD;  [facility];  Service: Endoscopy;  Laterality: N/A;  10:30    COLONOSCOPY WITH PROPOFOL N/A 07/07/2021   Procedure: COLONOSCOPY WITH PROPOFOL;  Surgeon: Daneil Dolin, MD;  [facility];  Service: Endoscopy;  Laterality: N/A;  12:15pm   ESOPHAGOGASTRODUODENOSCOPY  04/10/2003   Normal esophagus/Antral erosions, as described above.  The remainder of the stomach appeared normal.  Normal first and second parts of the duodenum.   ESOPHAGOGASTRODUODENOSCOPY  12/20/2011   [name]-hiatus hernia/chronic gastritis/normal duodenumNEGATIVE H pylori, negative SB biopsy   POLYPECTOMY  07/07/2021   Procedure: POLYPECTOMY;  Surgeon: Daneil Dolin, MD;  [facility];  Service: Endoscopy;;   TIBIA FRACTURE SURGERY  09/11/2010   right    There were no vitals filed for this visit.   Subjective Assessment - 10/07/21 1036     Subjective Patient states her neck is stiff. She did not like percussion gun. She would like some dry needling and message. She needs some exercises for her whole back.    Limitations House hold activities;Sitting    Patient Stated Goals Decreased pain    Currently in Pain? Yes    Pain Score 2     Pain Location Neck    Pain Descriptors / Indicators Aching    Pain Type Chronic pain  [place] Adult PT Treatment/Exercise - 10/07/21 0001       Shoulder Exercises: Seated   Other Seated Exercises chin tuck x 10, scap retraction x10; UT stretch 3 x 20 second holds bilateral    Other Seated Exercises t/sp extension over chair 10 x 5 second, seated thoracic rotation x10      Shoulder Exercises: Standing   Other Standing Exercises band rows RTB x10      Manual Therapy   Manual Therapy Soft tissue mobilization    Manual therapy comments completed independent from all other aspects of treatment    Soft tissue mobilization STM to bilateral upper traps pre and post dry needling for trigger point ID and surface area prep              Trigger Point Dry Needling -  10/07/21 0001     Consent Given? Yes    Education Handout Provided Previously provided    Muscles Treated Head and Neck Upper trapezius    Dry Needling Comments 2 x needles to each upper trap with pistoning, patient in prone    Other Dry Needling tolerated well    Upper Trapezius Response Twitch reponse elicited;Palpable increased muscle length                     PT Short Term Goals - 09/27/21 0926       PT SHORT TERM GOAL #1   Title Patient will be independent with initial HEP and self-management strategies to improve functional outcomes    Time 3    Period Weeks    Status New    Target Date 10/18/21               PT Long Term Goals - 09/27/21 0926       PT LONG TERM GOAL #1   Title Patient will be independent with advanced HEP and self-management strategies to improve functional outcomes    Time 6    Period Weeks    Status New    Target Date 11/08/21      PT LONG TERM GOAL #2   Title Patient will report at least 65% overall improvement in subjective complaint to indicate improvement in ability to perform ADLs.    Time 6    Period Weeks    Status New    Target Date 11/08/21      PT LONG TERM GOAL #3   Title Patient will improve FOTO score to predicted value to indicate improvement in functional outcomes    Time 6    Period Weeks    Status New    Target Date 11/08/21                   Plan - 10/07/21 1113     Clinical Impression Statement Patient tolerated session well. Reviewed previous ther ex. Progressed postural strengthening with added band rows. Patient cued on proper mechanics and hold times with upper trap stretching. Performed dry needling with good response. Patient noting decreased neck tension and improved mobility following., Issued updated HEP handout. Patient will continue to benefit from skilled therapy services to reduce deficits and improve functional level.    Examination-Activity Limitations Reach  Overhead;Lift;Sit;Carry    Examination-Participation Restrictions Cleaning;Community Activity;Shop;Meal Prep;Laundry;Yard Work    Stability/Clinical Decision Making Stable/Uncomplicated    Rehab Potential Good    PT Frequency 2x / week    PT Duration 6 weeks    PT Treatment/Interventions ADLs/Self Care Home Management;Fluidtherapy;Parrafin;Ultrasound;Neuromuscular re-education;Scar  mobilization;Contrast Bath;DME Instruction;Biofeedback;Stair training;Cryotherapy;Electrical Stimulation;Therapeutic activities;Patient/family education;Passive range of motion;Spinal Manipulations;Visual/perceptual remediation/compensation;Joint Manipulations;Dry needling;Energy conservation;Orthotic Fit/Training;Splinting;Taping;Vasopneumatic Device;Manual techniques;Therapeutic exercise;Iontophoresis 4mg /ml Dexamethasone;Moist Heat;Traction;Balance training;Manual lymph drainage    PT Next Visit Plan Progress posturing and scapular strengthening as tolerated. Manual STM as indicated and possible DN; Transition into strength program when able so patient can return to gym    PT Home Exercise Plan Eval: scap retraction, chin tuck, 1/25 standing trunk flexion/thoracic extension stretch at counter, UT stretch 1/27 thoracic extension, rotation, UT stretch and band rows    Consulted and Agree with Plan of Care Patient             Patient will benefit from skilled therapeutic intervention in order to improve the following deficits and impairments:  Pain, Impaired UE functional use, Increased fascial restricitons, Decreased activity tolerance, Improper body mechanics, Impaired flexibility, Postural dysfunction  Visit Diagnosis: Cervicalgia  Pain in thoracic spine  Abnormal posture     Problem List Patient Active Problem List   Diagnosis Date Noted   Plantar fasciitis 04/18/2018   Dyspnea on exertion 10/16/2016   Essential hypertension 10/16/2016   Morbid obesity due to excess calories ([place]) 10/16/2016   IBS  (irritable bowel syndrome) 02/29/2012   Tibial plateau fracture 97/41/6384   HELICOBACTER PYLORI INFECTION 08/25/2009   DEPRESSION 08/25/2009   ASTHMA 08/25/2009   GASTROESOPHAGEAL REFLUX DISEASE, CHRONIC 08/25/2009   ALLERGY 08/25/2009   Cholelithiasis 08/25/2009   11:18 AM, 10/07/21 Josue Hector PT DPT  Physical Therapist with [facility]  [phone] [place] [address] [place], [place], [address] Phone: [phone]   Fax:  [phone]  Name: Marie Farmer MRN: [id number] Date of Birth: March 11, 1956

## 2021-10-12 ENCOUNTER — Encounter (HOSPITAL_COMMUNITY): Payer: Self-pay | Admitting: Physical Therapy

## 2021-10-12 ENCOUNTER — Ambulatory Visit (HOSPITAL_COMMUNITY): Payer: Medicare HMO | Attending: Physical Medicine and Rehabilitation | Admitting: Physical Therapy

## 2021-10-12 ENCOUNTER — Other Ambulatory Visit: Payer: Self-pay

## 2021-10-12 DIAGNOSIS — M546 Pain in thoracic spine: Secondary | ICD-10-CM | POA: Diagnosis not present

## 2021-10-12 DIAGNOSIS — M542 Cervicalgia: Secondary | ICD-10-CM

## 2021-10-12 DIAGNOSIS — R293 Abnormal posture: Secondary | ICD-10-CM

## 2021-10-12 NOTE — Patient Instructions (Signed)
Access Code: [email or web address] URL: [email or web address] Date: 10/12/2021 Prepared by: Margie Billet  Exercises Seated Thoracic Flexion and Rotation with Arms Crossed - 1 x daily - 7 x weekly - 10 reps Seated Upper Trapezius Stretch - 1 x daily - 7 x weekly - 5 reps - 20 second hold Standing Shoulder Row with Anchored Resistance - 1 x daily - 7 x weekly - 3 sets - 10 reps Sidelying Open Book - 1 x daily - 7 x weekly - 10 reps - 5 second hold Shoulder External Rotation and Scapular Retraction with Resistance - 1 x daily - 7 x weekly - 2 sets - 10 reps Standing Shoulder Horizontal Abduction with Resistance - 1 x daily - 7 x weekly - 2 sets - 10 reps

## 2021-10-12 NOTE — Therapy (Signed)
[facility], [place], [address] Phone: [phone]   Fax:  [phone]  Physical Therapy Treatment  Patient Details  Name: Marie Farmer MRN: [id number] Date of Birth: December 04, 1955 Referring Provider (PT): Barnet Pall NP   Encounter Date: 10/12/2021   PT End of Session - 10/12/21 1132     Visit Number 4    Number of Visits 12    Date for PT Re-Evaluation 11/08/21    Authorization Type [employer]    Authorization Time Period 12 visits approved 1/17 - 2/28    Authorization - Visit Number 4    Authorization - Number of Visits 12    Progress Note Due on Visit 10    PT Start Time 1133    PT Stop Time 1211    PT Time Calculation (min) 38 min    Activity Tolerance Patient tolerated treatment well    Behavior During Therapy WFL for tasks assessed/performed             Past Medical History:  Diagnosis Date   Anxiety    Asthma    DDD (degenerative disc disease)    chronic back pain   Fibromyalgia    Foot pain    GERD (gastroesophageal reflux disease)    Hypertension    IBS (irritable bowel syndrome)    Mood disorder (HCC)    PONV (postoperative nausea and vomiting)    Sleep apnea    Venous insufficiency    Wears glasses     Past Surgical History:  Procedure Laterality Date   CARPAL TUNNEL RELEASE Right 06/10/2013   Procedure: CARPAL TUNNEL RELEASE;  Surgeon: Wynonia Sours, MD;  Location: [facility];  Service: Orthopedics;  Laterality: Right;   CHOLECYSTECTOMY N/A 08/19/2021   Procedure: LAPAROSCOPIC CHOLECYSTECTOMY;  Surgeon: Virl Cagey, MD;  [facility];  Service: General;  Laterality: N/A;   COLONOSCOPY  12/20/2011   [name]-single diminutive sessile polyp in the sigmoid colon/small interenal hemorrhoids   COLONOSCOPY N/A 11/28/2017   Procedure: COLONOSCOPY;  Surgeon: Daneil Dolin, MD;  [facility];  Service: Endoscopy;  Laterality: N/A;  10:30    COLONOSCOPY WITH PROPOFOL N/A 07/07/2021   Procedure: COLONOSCOPY WITH PROPOFOL;  Surgeon: Daneil Dolin, MD;  [facility];  Service: Endoscopy;  Laterality: N/A;  12:15pm   ESOPHAGOGASTRODUODENOSCOPY  04/10/2003   Normal esophagus/Antral erosions, as described above.  The remainder of the stomach appeared normal.  Normal first and second parts of the duodenum.   ESOPHAGOGASTRODUODENOSCOPY  12/20/2011   [name]-hiatus hernia/chronic gastritis/normal duodenumNEGATIVE H pylori, negative SB biopsy   POLYPECTOMY  07/07/2021   Procedure: POLYPECTOMY;  Surgeon: Daneil Dolin, MD;  [facility];  Service: Endoscopy;;   TIBIA FRACTURE SURGERY  09/11/2010   right    There were no vitals filed for this visit.   Subjective Assessment - 10/12/21 1133     Subjective Patient states worse pain on R side over ribs today. Actually, the whole back. She doesnt have a good chair for thoracic extension exercises. Dry needling helped release tension.    Limitations House hold activities;Sitting    Patient Stated Goals Decreased pain    Pain Score 4     Pain Location Neck    Pain Descriptors / Indicators Aching    Pain Type Chronic pain  [facility] Adult PT Treatment/Exercise - 10/12/21 0001       Shoulder Exercises: Seated   Other Seated Exercises t/sp extension over chair 10 x 5 second, seated thoracic rotation x10      Shoulder Exercises: Sidelying   Other Sidelying Exercises open book with elbow bent 10 x 5 second holds bilateral      Shoulder Exercises: Standing   Other Standing Exercises scap retraction with GH ER 1x 10 red band; horizontal abduction    Other Standing Exercises thoracic rotation      Manual Therapy   Manual Therapy Soft tissue mobilization    Manual therapy comments completed independent from all other aspects of treatment    Soft tissue mobilization STM to bilateral upper traps pre and post dry  needling for trigger point ID and surface area prep              Trigger Point Dry Needling - 10/12/21 0001     Consent Given? Yes    Education Handout Provided Previously provided    Muscles Treated Head and Neck Upper trapezius    Dry Needling Comments 2 x needles to each upper trap with pistoning, patient in prone    Other Dry Needling tolerated well    Upper Trapezius Response Twitch reponse elicited;Palpable increased muscle length                   PT Education - 10/12/21 1133     Education Details HEP    Person(s) Educated Patient    Methods Explanation;Demonstration    Comprehension Verbalized understanding;Returned demonstration              PT Short Term Goals - 09/27/21 0926       PT SHORT TERM GOAL #1   Title Patient will be independent with initial HEP and self-management strategies to improve functional outcomes    Time 3    Period Weeks    Status New    Target Date 10/18/21               PT Long Term Goals - 09/27/21 0926       PT LONG TERM GOAL #1   Title Patient will be independent with advanced HEP and self-management strategies to improve functional outcomes    Time 6    Period Weeks    Status New    Target Date 11/08/21      PT LONG TERM GOAL #2   Title Patient will report at least 65% overall improvement in subjective complaint to indicate improvement in ability to perform ADLs.    Time 6    Period Weeks    Status New    Target Date 11/08/21      PT LONG TERM GOAL #3   Title Patient will improve FOTO score to predicted value to indicate improvement in functional outcomes    Time 6    Period Weeks    Status New    Target Date 11/08/21                   Plan - 10/12/21 1133     Clinical Impression Statement Patient tolerated STM to bilateral UT while assessing for TPs. Patient again educated on possible adverse reactions during/following DN. Patient with reduction in muscle tension following DN to  bilateral UT. Continued with spinal mobility and postural strengthening which are tolerated well. Educated on probable muscle soreness. Patient will continue to benefit from skilled physical therapy in order  to reduce impairment and improve function.    Examination-Activity Limitations Reach Overhead;Lift;Sit;Carry    Examination-Participation Restrictions Cleaning;Community Activity;Shop;Meal Prep;Laundry;Yard Work    Stability/Clinical Decision Making Stable/Uncomplicated    Rehab Potential Good    PT Frequency 2x / week    PT Duration 6 weeks    PT Treatment/Interventions ADLs/Self Care Home Management;Fluidtherapy;Parrafin;Ultrasound;Neuromuscular re-education;Scar mobilization;Contrast Bath;DME Instruction;Biofeedback;Stair training;Cryotherapy;Electrical Stimulation;Therapeutic activities;Patient/family education;Passive range of motion;Spinal Manipulations;Visual/perceptual remediation/compensation;Joint Manipulations;Dry needling;Energy conservation;Orthotic Fit/Training;Splinting;Taping;Vasopneumatic Device;Manual techniques;Therapeutic exercise;Iontophoresis 57m/ml Dexamethasone;Moist Heat;Traction;Balance training;Manual lymph drainage    PT Next Visit Plan Progress posturing and scapular strengthening as tolerated. Manual STM as indicated and possible DN; Transition into strength program when able so patient can return to gym    PT Home Exercise Plan Eval: scap retraction, chin tuck, 1/25 standing trunk flexion/thoracic extension stretch at counter, UT stretch 1/27 thoracic extension, rotation, UT stretch and band rows 2/1 scap ret with [place], horizontal abd, open book    Consulted and Agree with Plan of Care Patient             Patient will benefit from skilled therapeutic intervention in order to improve the following deficits and impairments:  Pain, Impaired UE functional use, Increased fascial restricitons, Decreased activity tolerance, Improper body mechanics, Impaired flexibility,  Postural dysfunction  Visit Diagnosis: Cervicalgia  Pain in thoracic spine  Abnormal posture     Problem List Patient Active Problem List   Diagnosis Date Noted   Plantar fasciitis 04/18/2018   Dyspnea on exertion 10/16/2016   Essential hypertension 10/16/2016   [place] 10/16/2016   IBS (irritable bowel syndrome) 02/29/2012   Tibial plateau fracture 023/34/3568  HELICOBACTER PYLORI INFECTION 08/25/2009   DEPRESSION 08/25/2009   ASTHMA 08/25/2009   GASTROESOPHAGEAL REFLUX DISEASE, CHRONIC 08/25/2009   ALLERGY 08/25/2009   Cholelithiasis 08/25/2009   12:16 PM, 10/12/21 AMearl LatinPT, DPT [place] [address]Phone: [phone]  Fax:  [phone] Name: Marie JASEKMRN: [id number] of Birth: 605-Jun-1957

## 2021-10-14 ENCOUNTER — Encounter (HOSPITAL_COMMUNITY): Payer: Medicare HMO | Admitting: Physical Therapy

## 2021-10-18 ENCOUNTER — Encounter (HOSPITAL_COMMUNITY): Payer: Medicare HMO | Admitting: Physical Therapy

## 2021-10-19 DIAGNOSIS — R413 Other amnesia: Secondary | ICD-10-CM | POA: Diagnosis not present

## 2021-10-20 ENCOUNTER — Encounter (HOSPITAL_COMMUNITY): Payer: Medicare HMO | Admitting: Physical Therapy

## 2021-10-25 ENCOUNTER — Encounter (HOSPITAL_COMMUNITY): Payer: Medicare HMO | Admitting: Physical Therapy

## 2021-10-27 ENCOUNTER — Encounter (HOSPITAL_COMMUNITY): Payer: Medicare HMO | Admitting: Physical Therapy

## 2021-11-01 ENCOUNTER — Encounter (HOSPITAL_COMMUNITY): Payer: Medicare HMO | Admitting: Physical Therapy

## 2021-11-03 ENCOUNTER — Encounter (HOSPITAL_COMMUNITY): Payer: Medicare HMO | Admitting: Physical Therapy

## 2021-11-03 DIAGNOSIS — R413 Other amnesia: Secondary | ICD-10-CM | POA: Diagnosis not present

## 2021-11-03 DIAGNOSIS — R4181 Age-related cognitive decline: Secondary | ICD-10-CM | POA: Diagnosis not present

## 2021-11-03 DIAGNOSIS — F339 Major depressive disorder, recurrent, unspecified: Secondary | ICD-10-CM | POA: Diagnosis not present

## 2021-11-03 DIAGNOSIS — I10 Essential (primary) hypertension: Secondary | ICD-10-CM | POA: Diagnosis not present

## 2021-11-03 DIAGNOSIS — Z299 Encounter for prophylactic measures, unspecified: Secondary | ICD-10-CM | POA: Diagnosis not present

## 2021-11-03 DIAGNOSIS — E538 Deficiency of other specified B group vitamins: Secondary | ICD-10-CM | POA: Diagnosis not present

## 2021-11-07 DIAGNOSIS — L570 Actinic keratosis: Secondary | ICD-10-CM | POA: Diagnosis not present

## 2021-11-07 DIAGNOSIS — C44629 Squamous cell carcinoma of skin of left upper limb, including shoulder: Secondary | ICD-10-CM | POA: Diagnosis not present

## 2021-11-07 DIAGNOSIS — L988 Other specified disorders of the skin and subcutaneous tissue: Secondary | ICD-10-CM | POA: Diagnosis not present

## 2021-11-07 DIAGNOSIS — D0462 Carcinoma in situ of skin of left upper limb, including shoulder: Secondary | ICD-10-CM | POA: Diagnosis not present

## 2021-11-08 ENCOUNTER — Encounter (HOSPITAL_COMMUNITY): Payer: Medicare HMO | Admitting: Physical Therapy

## 2021-11-09 ENCOUNTER — Encounter (HOSPITAL_COMMUNITY): Payer: Self-pay | Admitting: Physical Therapy

## 2021-11-09 NOTE — Therapy (Signed)
[place] ?[facility] ?[address] ?[place], [place], [address] ?Phone: [phone]   Fax:  [phone] ? ?Patient Details  ?Name: Marie Farmer ?MRN: [id number] ?Date of Birth: 04/07/1956 ?Referring Provider:  No ref. provider found ? ?Encounter Date: 11/09/2021 ? ?PHYSICAL THERAPY DISCHARGE SUMMARY ? ?Visits from Start of Care: 4 ? ?Current functional level related to goals / functional outcomes: ?NA ?  ?Remaining deficits: ?NA ?  ?Education / Equipment: ?Patient request self DC   ? ?Patient agrees to discharge. Patient goals were not met. Patient is being discharged due to the patient's request. ? ?11:47 AM, 11/09/21 ?  PT DPT  ?Physical Therapist with [place]  ?[facility]  ?[phone] ? ? ?[place] ?[facility] ?[address] ?[place], [place], [address] ?Phone: [phone]   Fax:  [phone] ?

## 2021-11-10 ENCOUNTER — Encounter (HOSPITAL_COMMUNITY): Payer: Medicare HMO | Admitting: Physical Therapy

## 2021-11-24 DIAGNOSIS — I10 Essential (primary) hypertension: Secondary | ICD-10-CM | POA: Diagnosis not present

## 2021-11-24 DIAGNOSIS — Z299 Encounter for prophylactic measures, unspecified: Secondary | ICD-10-CM | POA: Diagnosis not present

## 2021-11-24 DIAGNOSIS — E039 Hypothyroidism, unspecified: Secondary | ICD-10-CM | POA: Diagnosis not present

## 2021-11-24 DIAGNOSIS — C44629 Squamous cell carcinoma of skin of left upper limb, including shoulder: Secondary | ICD-10-CM | POA: Diagnosis not present

## 2021-11-24 DIAGNOSIS — F339 Major depressive disorder, recurrent, unspecified: Secondary | ICD-10-CM | POA: Diagnosis not present

## 2021-11-24 DIAGNOSIS — M069 Rheumatoid arthritis, unspecified: Secondary | ICD-10-CM | POA: Diagnosis not present

## 2021-11-25 DIAGNOSIS — M069 Rheumatoid arthritis, unspecified: Secondary | ICD-10-CM | POA: Diagnosis not present

## 2021-11-25 DIAGNOSIS — E039 Hypothyroidism, unspecified: Secondary | ICD-10-CM | POA: Diagnosis not present

## 2021-12-09 DIAGNOSIS — E039 Hypothyroidism, unspecified: Secondary | ICD-10-CM | POA: Diagnosis not present

## 2021-12-09 DIAGNOSIS — E041 Nontoxic single thyroid nodule: Secondary | ICD-10-CM | POA: Diagnosis not present

## 2021-12-12 ENCOUNTER — Ambulatory Visit: Payer: Medicare HMO | Admitting: Podiatry

## 2021-12-12 ENCOUNTER — Encounter: Payer: Self-pay | Admitting: Podiatry

## 2021-12-12 DIAGNOSIS — L84 Corns and callosities: Secondary | ICD-10-CM | POA: Diagnosis not present

## 2021-12-12 DIAGNOSIS — M7750 Other enthesopathy of unspecified foot: Secondary | ICD-10-CM

## 2021-12-12 DIAGNOSIS — M21619 Bunion of unspecified foot: Secondary | ICD-10-CM | POA: Diagnosis not present

## 2021-12-12 DIAGNOSIS — M5136 Other intervertebral disc degeneration, lumbar region: Secondary | ICD-10-CM | POA: Insufficient documentation

## 2021-12-12 DIAGNOSIS — M797 Fibromyalgia: Secondary | ICD-10-CM | POA: Insufficient documentation

## 2021-12-12 DIAGNOSIS — E048 Other specified nontoxic goiter: Secondary | ICD-10-CM | POA: Insufficient documentation

## 2021-12-12 DIAGNOSIS — D4411 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: Secondary | ICD-10-CM | POA: Insufficient documentation

## 2021-12-13 ENCOUNTER — Other Ambulatory Visit: Payer: Self-pay | Admitting: Endocrinology

## 2021-12-13 DIAGNOSIS — E041 Nontoxic single thyroid nodule: Secondary | ICD-10-CM

## 2021-12-16 ENCOUNTER — Ambulatory Visit
Admission: RE | Admit: 2021-12-16 | Discharge: 2021-12-16 | Disposition: A | Discharge status: Home / Self Care | Payer: Medicare HMO | Source: Ambulatory Visit | Attending: Endocrinology | Admitting: Endocrinology

## 2021-12-16 DIAGNOSIS — E041 Nontoxic single thyroid nodule: Secondary | ICD-10-CM

## 2021-12-19 DIAGNOSIS — M21619 Bunion of unspecified foot: Secondary | ICD-10-CM | POA: Insufficient documentation

## 2021-12-19 NOTE — Progress Notes (Signed)
Subjective:  ? ?Patient ID: Marie Farmer, female   DOB: 66 y.o.   MRN: [id number]  ? ?HPI ?66 year old female presents the office today for concerns of calluses she points on the medial aspect of bilateral hallux which started on June 2022.Marland Kitchen  She has tried a foot peel and creams on the areas without significant resolution.  Denies any open sores or any swelling or redness or any drainage. ? ? ?Review of Systems  ?All other systems reviewed and are negative. ? ?Past Medical History:  ?Diagnosis Date  ? Anxiety   ? Asthma   ? DDD (degenerative disc disease)   ? chronic back pain  ? Fibromyalgia   ? Foot pain   ? GERD (gastroesophageal reflux disease)   ? Hypertension   ? IBS (irritable bowel syndrome)   ? Mood disorder ([place])   ? PONV (postoperative nausea and vomiting)   ? Sleep apnea   ? Venous insufficiency   ? Wears glasses   ? ? ?Past Surgical History:  ?Procedure Laterality Date  ? CARPAL TUNNEL RELEASE Right 06/10/2013  ? Procedure: CARPAL TUNNEL RELEASE;  Surgeon: Wynonia Sours, MD;  Location: [facility];  Service: Orthopedics;  Laterality: Right;  ? CHOLECYSTECTOMY N/A 08/19/2021  ? Procedure: LAPAROSCOPIC CHOLECYSTECTOMY;  Surgeon: Virl Cagey, MD;  [facility];  Service: General;  Laterality: N/A;  ? COLONOSCOPY  12/20/2011  ? [name]-single diminutive sessile polyp in the sigmoid colon/small interenal hemorrhoids  ? COLONOSCOPY N/A 11/28/2017  ? Procedure: COLONOSCOPY;  Surgeon: Daneil Dolin, MD;  [facility];  Service: Endoscopy;  Laterality: N/A;  10:30  ? COLONOSCOPY WITH PROPOFOL N/A 07/07/2021  ? Procedure: COLONOSCOPY WITH PROPOFOL;  Surgeon: Daneil Dolin, MD;  [facility];  Service: Endoscopy;  Laterality: N/A;  12:15pm  ? ESOPHAGOGASTRODUODENOSCOPY  04/10/2003  ? Normal esophagus/Antral erosions, as described above.  The remainder of the stomach appeared normal.  Normal first and second parts of the duodenum.  ? ESOPHAGOGASTRODUODENOSCOPY   12/20/2011  ? [name]-hiatus hernia/chronic gastritis/normal duodenumNEGATIVE H pylori, negative SB biopsy  ? POLYPECTOMY  07/07/2021  ? Procedure: POLYPECTOMY;  Surgeon: Daneil Dolin, MD;  [facility];  Service: Endoscopy;;  ? TIBIA FRACTURE SURGERY  09/11/2010  ? right  ? ? ? ?Current Outpatient Medications:  ?  acetaminophen (TYLENOL) 500 MG tablet, Take 1,000 mg by mouth every 6 (six) hours as needed for moderate pain., Disp: , Rfl:  ?  albuterol (VENTOLIN HFA) 108 (90 Base) MCG/ACT inhaler, Inhale 2 puffs into the lungs every 6 (six) hours as needed for wheezing or shortness of breath., Disp: , Rfl:  ?  escitalopram (LEXAPRO) 10 MG tablet, Take 10 mg by mouth daily., Disp: , Rfl:  ?  estradiol (ESTRACE) 0.1 MG/GM vaginal cream, Place 1 Applicatorful vaginally daily as needed (Menopause)., Disp: , Rfl:  ?  gabapentin (NEURONTIN) 600 MG tablet, Take 600 mg by mouth 3 (three) times daily., Disp: , Rfl:  ?  levothyroxine (SYNTHROID) 25 MCG tablet, Take 50 mcg by mouth daily before breakfast., Disp: , Rfl:  ?  methocarbamol (ROBAXIN) 500 MG tablet, Take 500 mg by mouth 2 (two) times daily., Disp: , Rfl:  ?  montelukast (SINGULAIR) 10 MG tablet, Take 10 mg by mouth daily., Disp: , Rfl:  ?  omeprazole (PRILOSEC) 40 MG capsule, Take 1 capsule (40 mg total) by mouth daily., Disp: 90 capsule, Rfl: 3 ?  sulfaSALAzine (AZULFIDINE) 500 MG tablet, Take  by mouth., Disp: , Rfl:  ?  tiZANidine (ZANAFLEX) 4 MG tablet, Take 4 mg by mouth 3 (three) times daily., Disp: , Rfl:  ?  valsartan-hydrochlorothiazide (DIOVAN-HCT) 160-25 MG tablet, TAKE 1 TABLET BY MOUTH DAILY., Disp: 30 tablet, Rfl: 0 ?  Vitamin D, Ergocalciferol, (DRISDOL) 1.25 MG (50000 UNIT) CAPS capsule, Take 50,000 Units by mouth every Sunday., Disp: , Rfl:  ? ?Allergies  ?Allergen Reactions  ? Ace Inhibitors Shortness Of Breath  ? Adhesive [Tape]   ?  Peels skin  ? Cymbalta [Duloxetine Hcl]   ?  Gums red, mouth felt hout  ? Latex   ?   Sensitive skin, causes redness   ? Methylprednisolone   ?  Redness all over  ? Prednisone Other (See Comments)  ?  Redness all over  ? Azithromycin Rash  ? Nabumetone Rash and Other (See Comments)  ?  lips swollen ? ?  ? ? ? ? ? ?   ?Objective:  ?Physical Exam  ?General: AAO x3, NAD ? ?Dermatological: Hyperkeratotic lesions medial hallux bilaterally at the IPJ left side is about the same as the right.  Also she gets at the tips of the second and third toes.  There is no ongoing ulceration drainage or any signs of infection noted today.  There is no ulcerations. ? ?Vascular: Dorsalis Pedis artery and Posterior Tibial artery pedal pulses are 2/4 bilateral with immedate capillary fill time. There is no pain with calf compression, swelling, warmth, erythema.  ? ?Neruologic: Grossly intact via light touch bilateral.  ? ?Musculoskeletal: Bunions are present bilaterally.  Muscular strength 5/5 in all groups tested bilateral. ? ?Gait: Unassisted, Nonantalgic.  ? ? ?   ?Assessment:  ? ?Hyperkeratotic lesions ? ?   ?Plan:  ?-Treatment options discussed including all alternatives, risks, and complications ?-Etiology of symptoms were discussed ?-As a courtesy I did debride the calluses without any complications or bleeding.  Ultimately it is coming from the bunions as well as her foot structure.  Power steps were dispensed and she also has custom inserts in order to try to work on recovery for her.  I will have her see Aaron Edelman for this.  Continue moisturizer daily. ? ?Trula Slade DPM ? ?   ? ?

## 2021-12-20 ENCOUNTER — Other Ambulatory Visit: Payer: Self-pay | Admitting: Endocrinology

## 2021-12-20 DIAGNOSIS — E041 Nontoxic single thyroid nodule: Secondary | ICD-10-CM

## 2021-12-21 ENCOUNTER — Other Ambulatory Visit (HOSPITAL_COMMUNITY)
Admission: RE | Admit: 2021-12-21 | Discharge: 2021-12-21 | Disposition: A | Discharge status: Home / Self Care | Payer: Medicare HMO | Source: Ambulatory Visit | Attending: Interventional Radiology | Admitting: Interventional Radiology

## 2021-12-21 ENCOUNTER — Ambulatory Visit
Admission: RE | Admit: 2021-12-21 | Discharge: 2021-12-21 | Disposition: A | Discharge status: Home / Self Care | Payer: Medicare HMO | Source: Ambulatory Visit | Attending: Endocrinology | Admitting: Endocrinology

## 2021-12-21 DIAGNOSIS — E041 Nontoxic single thyroid nodule: Secondary | ICD-10-CM | POA: Diagnosis not present

## 2021-12-22 LAB — CYTOLOGY - NON PAP

## 2021-12-26 ENCOUNTER — Telehealth: Payer: Self-pay | Admitting: Podiatry

## 2022-01-27 DIAGNOSIS — M5430 Sciatica, unspecified side: Secondary | ICD-10-CM | POA: Diagnosis not present

## 2022-02-03 DIAGNOSIS — R102 Pelvic and perineal pain: Secondary | ICD-10-CM | POA: Diagnosis not present

## 2022-02-03 DIAGNOSIS — I10 Essential (primary) hypertension: Secondary | ICD-10-CM | POA: Diagnosis not present

## 2022-02-03 DIAGNOSIS — R29898 Other symptoms and signs involving the musculoskeletal system: Secondary | ICD-10-CM | POA: Diagnosis not present

## 2022-02-03 DIAGNOSIS — Z299 Encounter for prophylactic measures, unspecified: Secondary | ICD-10-CM | POA: Diagnosis not present

## 2022-02-03 DIAGNOSIS — M549 Dorsalgia, unspecified: Secondary | ICD-10-CM | POA: Diagnosis not present

## 2022-02-03 DIAGNOSIS — R159 Full incontinence of feces: Secondary | ICD-10-CM | POA: Diagnosis not present

## 2022-02-03 DIAGNOSIS — Z6834 Body mass index (BMI) 34.0-34.9, adult: Secondary | ICD-10-CM | POA: Diagnosis not present

## 2022-02-03 DIAGNOSIS — M5430 Sciatica, unspecified side: Secondary | ICD-10-CM | POA: Diagnosis not present

## 2022-02-03 DIAGNOSIS — M5136 Other intervertebral disc degeneration, lumbar region: Secondary | ICD-10-CM | POA: Diagnosis not present

## 2022-02-03 DIAGNOSIS — M5126 Other intervertebral disc displacement, lumbar region: Secondary | ICD-10-CM | POA: Diagnosis not present

## 2022-02-07 DIAGNOSIS — I10 Essential (primary) hypertension: Secondary | ICD-10-CM | POA: Diagnosis not present

## 2022-02-07 DIAGNOSIS — M549 Dorsalgia, unspecified: Secondary | ICD-10-CM | POA: Diagnosis not present

## 2022-02-07 DIAGNOSIS — Z299 Encounter for prophylactic measures, unspecified: Secondary | ICD-10-CM | POA: Diagnosis not present

## 2022-02-07 DIAGNOSIS — Z6834 Body mass index (BMI) 34.0-34.9, adult: Secondary | ICD-10-CM | POA: Diagnosis not present

## 2022-02-07 DIAGNOSIS — M069 Rheumatoid arthritis, unspecified: Secondary | ICD-10-CM | POA: Diagnosis not present

## 2022-02-10 ENCOUNTER — Other Ambulatory Visit: Payer: Self-pay | Admitting: Internal Medicine

## 2022-02-14 DIAGNOSIS — M545 Low back pain, unspecified: Secondary | ICD-10-CM | POA: Diagnosis not present

## 2022-02-14 DIAGNOSIS — M533 Sacrococcygeal disorders, not elsewhere classified: Secondary | ICD-10-CM | POA: Diagnosis not present

## 2022-02-15 ENCOUNTER — Telehealth: Payer: Self-pay | Admitting: Physical Medicine and Rehabilitation

## 2022-02-15 NOTE — Telephone Encounter (Signed)
Patient called. She would like to schedule with Dr. Ernestina Patches. Her call back number is [phone]

## 2022-02-16 ENCOUNTER — Ambulatory Visit: Payer: Medicare HMO | Admitting: Physical Medicine and Rehabilitation

## 2022-02-16 ENCOUNTER — Encounter: Payer: Self-pay | Admitting: Physical Medicine and Rehabilitation

## 2022-02-16 VITALS — BP 138/83 | HR 87

## 2022-02-16 DIAGNOSIS — M7071 Other bursitis of hip, right hip: Secondary | ICD-10-CM

## 2022-02-16 DIAGNOSIS — M797 Fibromyalgia: Secondary | ICD-10-CM | POA: Diagnosis not present

## 2022-02-16 DIAGNOSIS — M5441 Lumbago with sciatica, right side: Secondary | ICD-10-CM | POA: Diagnosis not present

## 2022-02-16 DIAGNOSIS — G8929 Other chronic pain: Secondary | ICD-10-CM

## 2022-02-16 DIAGNOSIS — R202 Paresthesia of skin: Secondary | ICD-10-CM | POA: Diagnosis not present

## 2022-02-16 DIAGNOSIS — M546 Pain in thoracic spine: Secondary | ICD-10-CM

## 2022-02-16 DIAGNOSIS — M5442 Lumbago with sciatica, left side: Secondary | ICD-10-CM | POA: Diagnosis not present

## 2022-02-16 DIAGNOSIS — M7918 Myalgia, other site: Secondary | ICD-10-CM | POA: Diagnosis not present

## 2022-02-16 NOTE — Progress Notes (Unsigned)
Pt state middle back pain that travels to her lower back and both legs and foot. Pt state walking, sitting and standing makes the pain worse. Pt state she takes pain meds to help ease her pain.  Numeric Pain Rating Scale and Functional Assessment Average Pain 9 Pain Right Now 4 My pain is constant, sharp, burning, dull, tingling, and aching Pain is worse with: bending, sitting, inactivity, and some activites Pain improves with: heat/ice, medication, and injections   In the last MONTH (on 0-10 scale) has pain interfered with the following?  1. General activity like being  able to carry out your everyday physical activities such as walking, climbing stairs, carrying groceries, or moving a chair?  Rating(5)  2. Relation with others like being able to carry out your usual social activities and roles such as  activities at home, at work and in your community. Rating(6)  3. Enjoyment of life such that you have  been bothered by emotional problems such as feeling anxious, depressed or irritable?  Rating(7)

## 2022-02-16 NOTE — Progress Notes (Unsigned)
Marie Farmer - 66 y.o. female MRN [id number]  Date of birth: 11/10/1955  Office Visit Note: Visit Date: 02/16/2022 PCP: Glenda Chroman, MD Referred by: Glenda Chroman, MD  Subjective: Chief Complaint  Patient presents with   Lower Back - Pain   Middle Back - Pain   Left Leg - Pain   Right Leg - Pain   Right Foot - Pain   Left Foot - Pain   HPI: Marie Farmer is a 66 y.o. female who comes in today for evaluation of chronic, worsening and severe right posterior hip and buttock pain. Patient reports pain has been ongoing for several months and worsens with prolonged sitting. Patient describes pain as constant sore and aching sensation. Some relief with donut pillow, changing positions frequently, standing and stretching exercises, currently rates as 8 out of 10. Patient had right ischial bursa injection performed in our off on 08/30/2021 and reports some relief of pain with this procedure.   Patient also reports chronic bilateral lower back pain radiating down to legs and intermittent radiation to mid thoracic region. Patient states pain has been ongoing for several months and is aggravated by movement and activity. Some relief of pain with home exercise regimen, physical therapy, rest and medications. Patients recent lumbar MRI imaging exhibits stable lumbar spine degeneration when compared to 2019. No neural impingement or visible inflammation. Thoracic MRI imaging from 2021 exhibits multilevel discogenic disease, no high grade spinal canal stenosis noted.   She also voiced new symptom of numbness/tingling to left hand/wrist. Patient also feels that left arm is weak. Patient reports symptoms ongoing for 2 weeks and worsens with activity. States entire hand and fingers feel numb and "asleep." Patient does have history of carpel tunnel release on the right many years ago while residing in [place]. No previous nerve testing performed to left upper extremity.   Patient recently finished regimen  of physical therapy/dry needling at [facility] for chronic issues with bilateral neck pain radiating down back to thoracic region. Patient reports significant relief of bilateral neck pain with these treatments. Cervical MRI from 2021 exhibits right paracentral disc protrusion at C4-C5, left lateral protrusion/subligamentous extrusion and severe left foraminal stenosis at C6-C7. No high grade spinal canal stenosis noted.  Patient denies focal weakness. Patient denies recent trauma or falls.     Review of Systems  Musculoskeletal:  Positive for back pain and myalgias.  Neurological:  Positive for tingling. Negative for focal weakness and weakness.  All other systems reviewed and are negative.  Otherwise per HPI.  Assessment & Plan: Visit Diagnoses:    ICD-10-CM   1. Chronic bilateral low back pain with bilateral sciatica  M54.42    M54.41    G89.29     2. Ischial bursitis, right  M70.71 Ambulatory referral to Physical Medicine Rehab    3. Chronic bilateral thoracic back pain  M54.6    G89.29     4. Myofascial pain syndrome  M79.18     5. Fibromyalgia  M79.7     6. Paresthesia of skin  R20.2 Ambulatory referral to Physical Medicine Rehab       Plan: Findings:  1. Chronic, worsening and severe right posterior hip and buttock pain. Patient continues to have severe pain despite good conservative therapies such as use of donut pillow pillow, changing positions frequently, standing and stretching exercises.  Patient's clinical presentation and exam are consistent with ischial bursitis.  We believe the next step is to  repeat right ischial bursa injection under fluoroscopic guidance.  We feel that we can get patient in quickly for this injection.  2. Chronic bilateral lower back pain radiating down legs and up to mid thoracic region.  Patient continues to have severe pain despite good conservative therapies such as formal physical therapy, home exercise regimen, rest and  medications.  Patient's clinical presentation and exam does not directly correlate with lumbar MRI imaging. We do not feel performing lumbar epidural steroid injection at this time would be beneficial. We feel bilateral thoracic back pain is more myofascial in nature as her pain is non-dermatomal, could also be exacerbated by her fibromyalgia.   4. Acute numbness and tingling to left hand/wrist. Previous carpel tunnel release on the right, we believe the next step is to perform left upper extremity NCV with EMG. Patient has good strength to left upper extremity upon exam today. If nerve testing does exhibit issues with carpel tunnel we would recommend patient follow up with Dr. Tempie Donning in our office for further evaluation.   5. Chronic bilateral neck pain. Significant and sustained pain relief with recent physical therapy/dry needling treatments. We will continue to monitor patient and re-group with PT as needed. We would also consider performing cervical epidural steroid injection if her pain seemed to be more radicular in nature.   I did briefly speak with patient about importance of sleep hygiene, physical activity and stress reduction in the treatment of fibromyalgia. I also talked about medication management related to fibromyalgia, unfortunately patient has multiple allergies/intolerances to medications including Cymbalta. No red flag symptoms noted upon exam today.     Meds & Orders: No orders of the defined types were placed in this encounter.   Orders Placed This Encounter  Procedures   Ambulatory referral to Physical Medicine Rehab   Ambulatory referral to Physical Medicine Rehab    Follow-up: Return for Right ischial bursa injection.   Procedures: No procedures performed      Clinical History: EXAM: MRI LUMBAR SPINE WITHOUT CONTRAST  TECHNIQUE: Multiplanar, multisequence MR imaging of the lumbar spine was performed. No intravenous contrast was administered.  COMPARISON:  02/01/2018  FINDINGS: Segmentation: Standard.  Alignment: Mild scoliosis  Vertebrae: No fracture, evidence of discitis, or aggressive bone lesion.  Conus medullaris and cauda equina: Conus extends to the L2 level. Conus and cauda equina appear normal.  Paraspinal and other soft tissues: Negative for perispinal mass or inflammation  Disc levels:  T12- L1: Unremarkable.  L1-L2: Mild disc space narrowing and bulging  L2-L3: Unremarkable.  L3-L4: Mild disc space narrowing and left eccentric bulging with small annular fissure.  L4-L5: Disc space narrowing and left preferential bulging with inferior foraminal protrusion. No neural compression  L5-S1:Degenerative facet spurring asymmetric to the right. Disc narrowing and mild bulging.  IMPRESSION: Stable lumbar spine degeneration when compared to 2019. No neural impingement or visible inflammation.   Electronically Signed By: Jorje Guild M.D. On: 02/05/2022 13:03   She reports that she has never smoked. She has never used smokeless tobacco. No results for input(s): "HGBA1C", "LABURIC" in the last 8760 hours.  Objective:  VS:  HT:    WT:   BMI:     BP:138/83  HR:87bpm  TEMP: ( )  RESP:  Physical Exam Vitals and nursing note reviewed.  HENT:     Head: Normocephalic and atraumatic.     Right Ear: External ear normal.     Left Ear: External ear normal.     Nose: Nose normal.  Mouth/Throat:     Mouth: Mucous membranes are moist.  Eyes:     Extraocular Movements: Extraocular movements intact.  Cardiovascular:     Rate and Rhythm: Normal rate.     Pulses: Normal pulses.  Pulmonary:     Effort: Pulmonary effort is normal.  Abdominal:     General: Abdomen is flat. There is no distension.  Musculoskeletal:        General: Tenderness present.     Cervical back: Normal range of motion.     Comments: Pt rises from seated position to standing without difficulty. Good lumbar range of motion. Strong distal  strength without clonus, no pain upon palpation of greater trochanters. No pain noted with bilateral hip internal/external rotation. Sensation intact bilaterally. Pain/tenderness noted upon palpation of right buttock region. Multiple palpable trigger points noted to bilateral trapezius muscles and thoracic paraspinal regions. Patient is tender to touch. Walks independently, gait steady.    No discomfort noted with flexion, extension and side-to-side rotation. Patient has good strength in the upper extremities including 5 out of 5 strength in wrist extension, long finger flexion and APB.  There is no atrophy of the hands intrinsically.  Sensation intact bilaterally. Negative Hoffman's sign.   Skin:    General: Skin is warm and dry.     Capillary Refill: Capillary refill takes less than 2 seconds.  Neurological:     General: No focal deficit present.     Mental Status: She is alert and oriented to person, place, and time.  Psychiatric:        Mood and Affect: Mood normal.        Behavior: Behavior normal.     Ortho Exam  Imaging: No results found.  Past Medical/Family/Surgical/Social History: Medications & Allergies reviewed per EMR, new medications updated. 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   Other specified nontoxic goiter 12/12/2021   Anemia of chronic disease 08/02/2021   Ankle impingement syndrome, left 03/25/2020   Osteochondritis dissecans of talus, left 03/25/2020   Instability of left ankle joint 02/03/2020   Plantar fasciitis 04/18/2018   Aphthous ulcer of mouth 04/02/2018   Difficulty sleeping 04/02/2018   Under emotional stress 04/02/2018   OSA on CPAP 02/01/2018   Dyspnea on exertion 10/16/2016   Essential hypertension 10/16/2016   Morbid obesity due to excess calories ([place]) 10/16/2016    IBS (irritable bowel syndrome) 02/29/2012   Tibial plateau fracture 76/73/4193   HELICOBACTER PYLORI INFECTION 08/25/2009   DEPRESSION 08/25/2009   ASTHMA 08/25/2009   GASTROESOPHAGEAL REFLUX DISEASE, CHRONIC 08/25/2009   ALLERGY 08/25/2009   Cholelithiasis 08/25/2009   Past Medical History:  Diagnosis Date   Anxiety    Asthma    DDD (degenerative disc disease)    chronic back pain   Fibromyalgia    Foot pain    GERD (gastroesophageal reflux disease)    Hypertension    IBS (irritable bowel syndrome)    Mood disorder (HCC)    PONV (postoperative nausea and vomiting)    Sleep apnea    Venous insufficiency    Wears glasses    Family History  Problem Relation Age of Onset   Diabetes Mother    Coronary artery disease Mother    COPD Mother    Colon polyps Mother        age 54  Lung cancer Father    Past Surgical History:  Procedure Laterality Date   CARPAL TUNNEL RELEASE Right 06/10/2013   Procedure: CARPAL TUNNEL RELEASE;  Surgeon: Wynonia Sours, MD;  Location: [place];  Service: Orthopedics;  Laterality: Right;   CHOLECYSTECTOMY N/A 08/19/2021   Procedure: LAPAROSCOPIC CHOLECYSTECTOMY;  Surgeon: Virl Cagey, MD;  Location: [facility];  Service: General;  Laterality: N/A;   COLONOSCOPY  12/20/2011   [name]-single diminutive sessile polyp in the sigmoid colon/small interenal hemorrhoids   COLONOSCOPY N/A 11/28/2017   Procedure: COLONOSCOPY;  Surgeon: Daneil Dolin, MD;  Location: [facility];  Service: Endoscopy;  Laterality: N/A;  10:30   COLONOSCOPY WITH PROPOFOL N/A 07/07/2021   Procedure: COLONOSCOPY WITH PROPOFOL;  Surgeon: Daneil Dolin, MD;  Location: [facility];  Service: Endoscopy;  Laterality: N/A;  12:15pm   ESOPHAGOGASTRODUODENOSCOPY  04/10/2003   Normal esophagus/Antral erosions, as described above.  The remainder of the stomach appeared normal.  Normal first and second parts of the duodenum.   ESOPHAGOGASTRODUODENOSCOPY   12/20/2011   [name]-hiatus hernia/chronic gastritis/normal duodenumNEGATIVE H pylori, negative SB biopsy   POLYPECTOMY  07/07/2021   Procedure: POLYPECTOMY;  Surgeon: Daneil Dolin, MD;  Location: [facility];  Service: Endoscopy;;   TIBIA FRACTURE SURGERY  09/11/2010   right   Social History   Occupational History   Occupation: unemployed  Tobacco Use   Smoking status: Never   Smokeless tobacco: Never  Substance and Sexual Activity   Alcohol use: Yes    Comment: socially, couple times per week   Drug use: No    Types: Marijuana    Comment: remote [place]   Sexual activity: Never    Birth control/protection: None

## 2022-02-17 ENCOUNTER — Encounter: Payer: Self-pay | Admitting: Physical Medicine and Rehabilitation

## 2022-02-21 DIAGNOSIS — Z789 Other specified health status: Secondary | ICD-10-CM | POA: Diagnosis not present

## 2022-02-21 DIAGNOSIS — M48061 Spinal stenosis, lumbar region without neurogenic claudication: Secondary | ICD-10-CM | POA: Diagnosis not present

## 2022-02-21 DIAGNOSIS — Z299 Encounter for prophylactic measures, unspecified: Secondary | ICD-10-CM | POA: Diagnosis not present

## 2022-02-21 DIAGNOSIS — E039 Hypothyroidism, unspecified: Secondary | ICD-10-CM | POA: Diagnosis not present

## 2022-02-21 DIAGNOSIS — F339 Major depressive disorder, recurrent, unspecified: Secondary | ICD-10-CM | POA: Diagnosis not present

## 2022-02-21 DIAGNOSIS — I10 Essential (primary) hypertension: Secondary | ICD-10-CM | POA: Diagnosis not present

## 2022-02-21 DIAGNOSIS — E538 Deficiency of other specified B group vitamins: Secondary | ICD-10-CM | POA: Diagnosis not present

## 2022-02-27 DIAGNOSIS — D649 Anemia, unspecified: Secondary | ICD-10-CM | POA: Diagnosis not present

## 2022-02-27 DIAGNOSIS — E559 Vitamin D deficiency, unspecified: Secondary | ICD-10-CM | POA: Diagnosis not present

## 2022-02-27 DIAGNOSIS — E041 Nontoxic single thyroid nodule: Secondary | ICD-10-CM | POA: Diagnosis not present

## 2022-02-27 DIAGNOSIS — M069 Rheumatoid arthritis, unspecified: Secondary | ICD-10-CM | POA: Diagnosis not present

## 2022-02-27 DIAGNOSIS — E039 Hypothyroidism, unspecified: Secondary | ICD-10-CM | POA: Diagnosis not present

## 2022-02-27 DIAGNOSIS — I10 Essential (primary) hypertension: Secondary | ICD-10-CM | POA: Diagnosis not present

## 2022-02-27 DIAGNOSIS — M810 Age-related osteoporosis without current pathological fracture: Secondary | ICD-10-CM | POA: Diagnosis not present

## 2022-03-07 DIAGNOSIS — L570 Actinic keratosis: Secondary | ICD-10-CM | POA: Diagnosis not present

## 2022-03-14 IMAGING — US US ABDOMEN LIMITED
1 series · 14 of 25 positions shown · non-contrast
Comparison: None.

CLINICAL DATA: Right upper quadrant pain

EXAM:
ULTRASOUND ABDOMEN LIMITED RIGHT UPPER QUADRANT

[Series 1: us abdomen limited · 0.21mm/px · 14 of 149 slices shown]
[im 1/149]
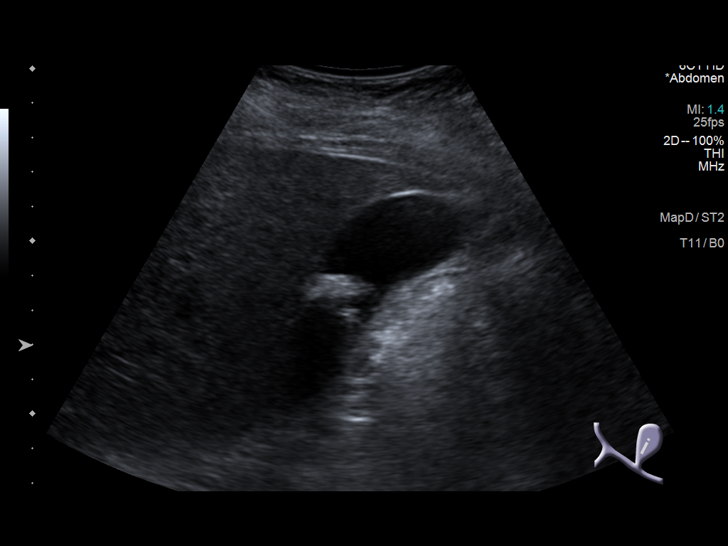
[im 13/149]
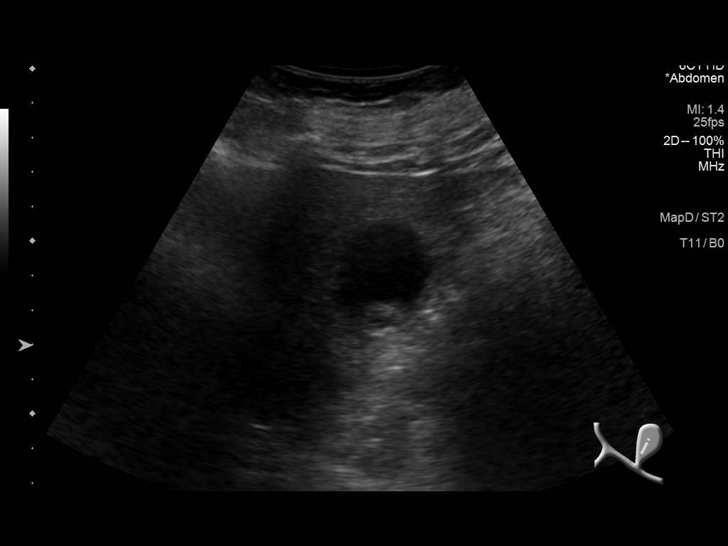
[im 25/149]
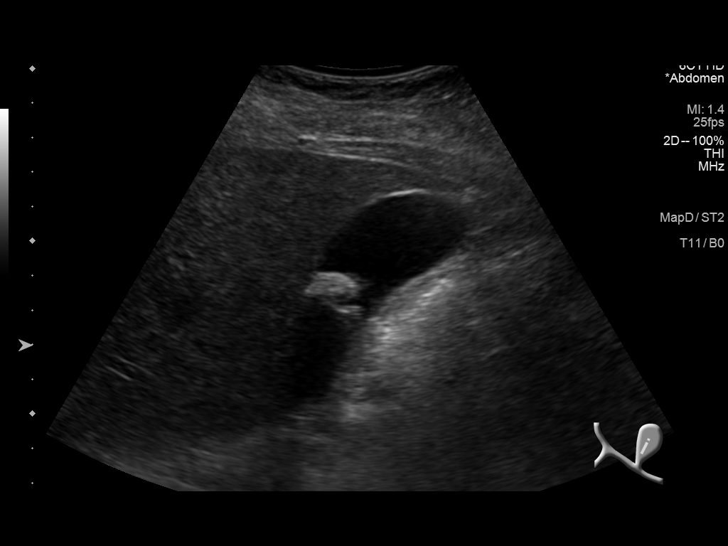
[im 38/149]
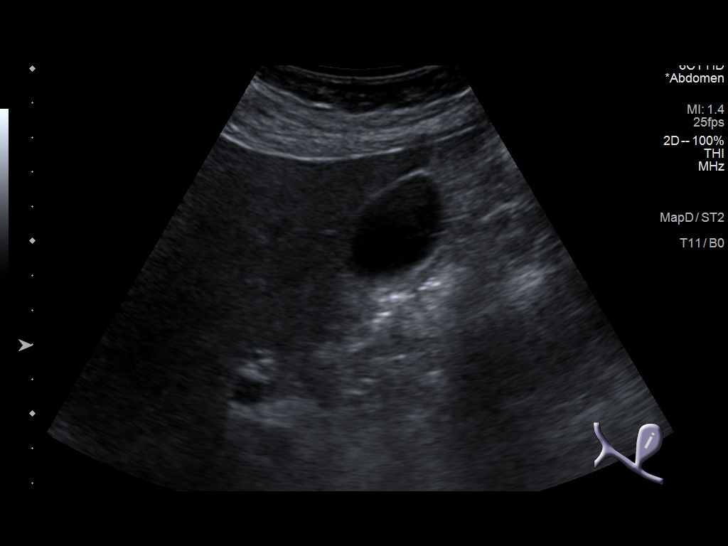
[im 50/149]
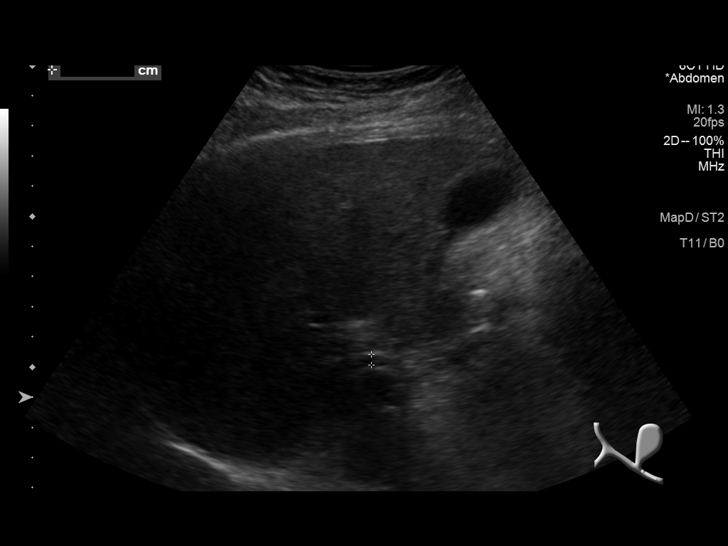
[im 56/149]
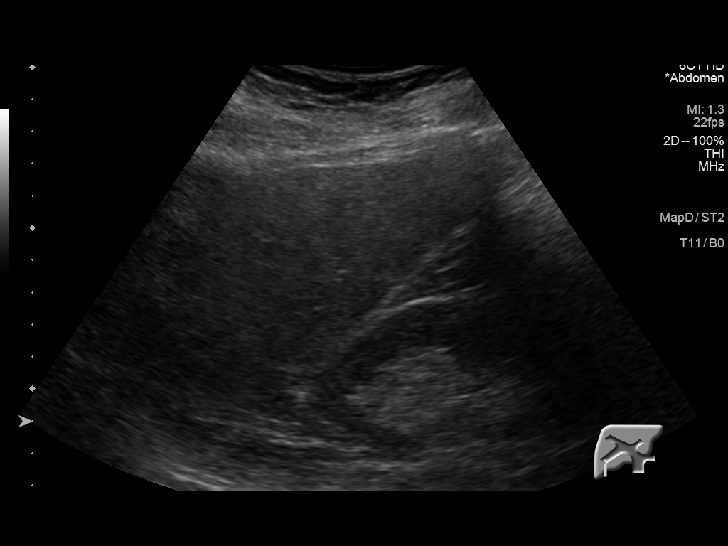
[im 68/149]
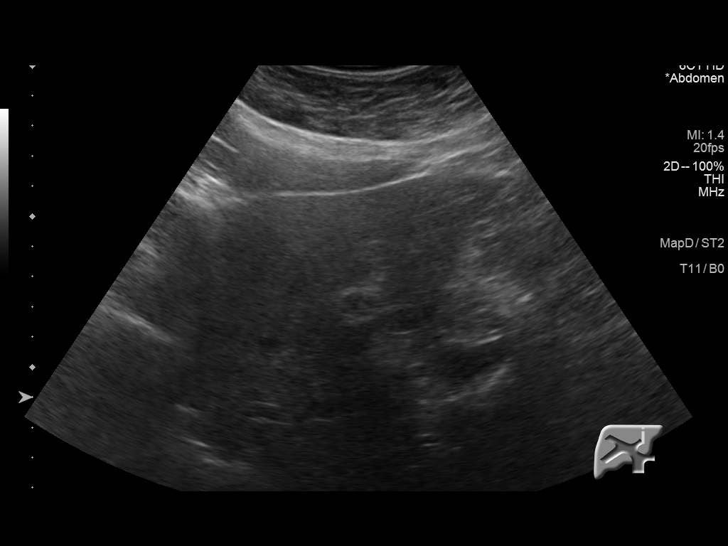
[im 81/149]
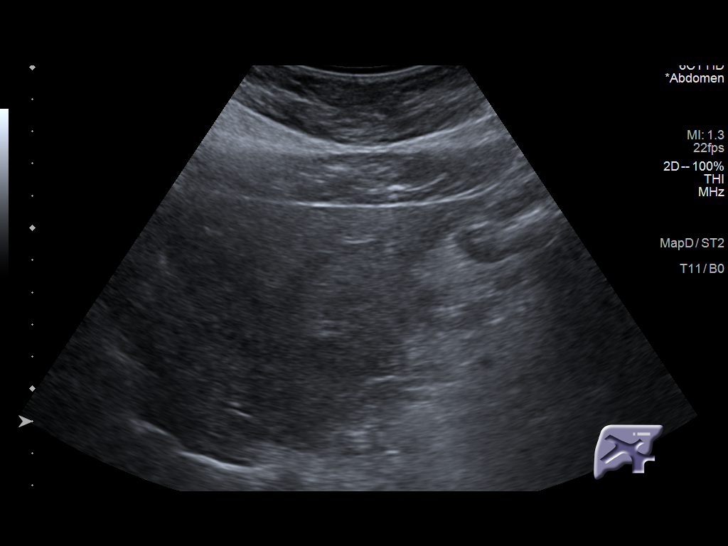
[im 93/149]
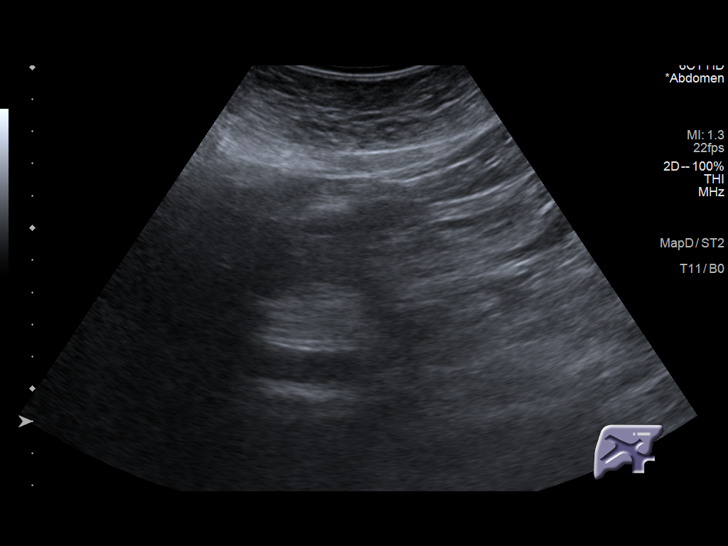
[im 99/149]
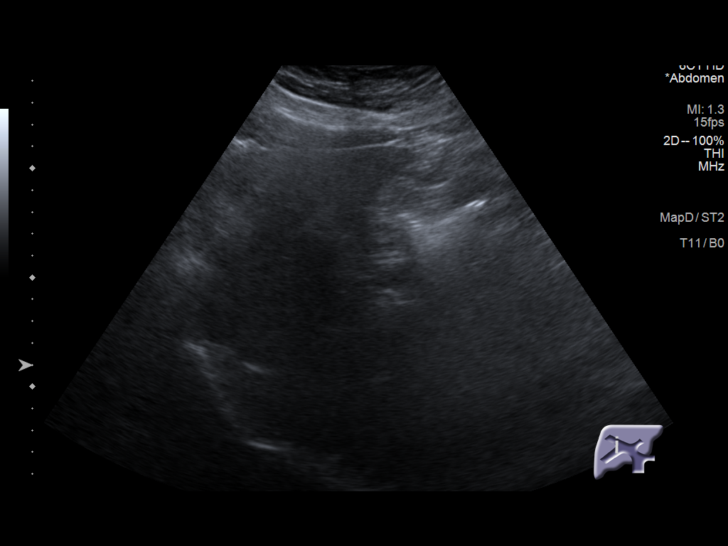
[im 112/149]
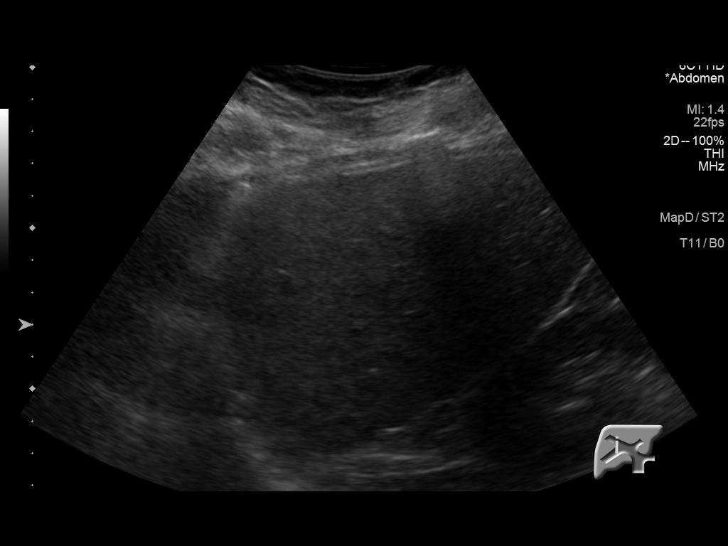
[im 124/149]
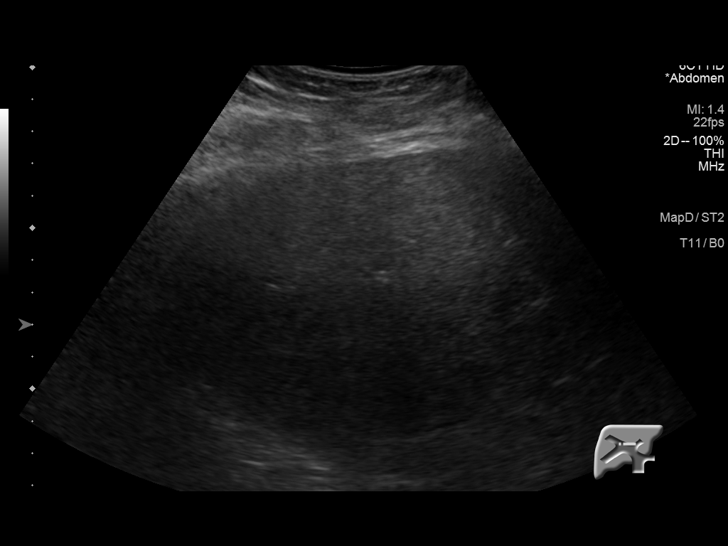
[im 136/149]
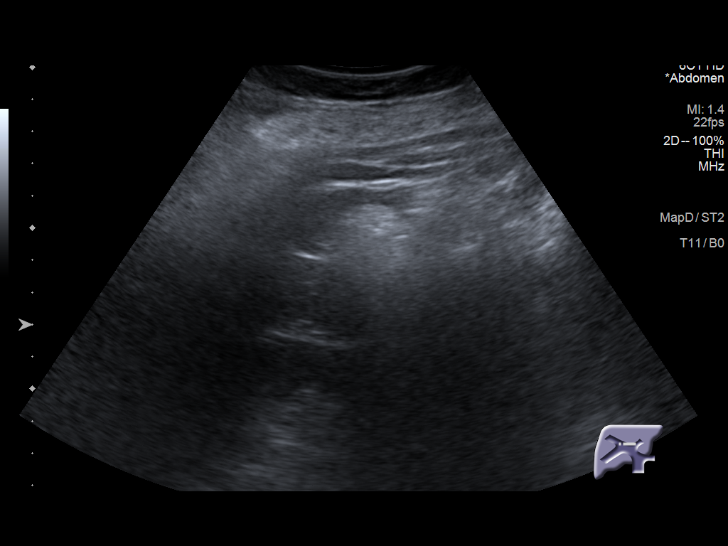
[im 149/149]
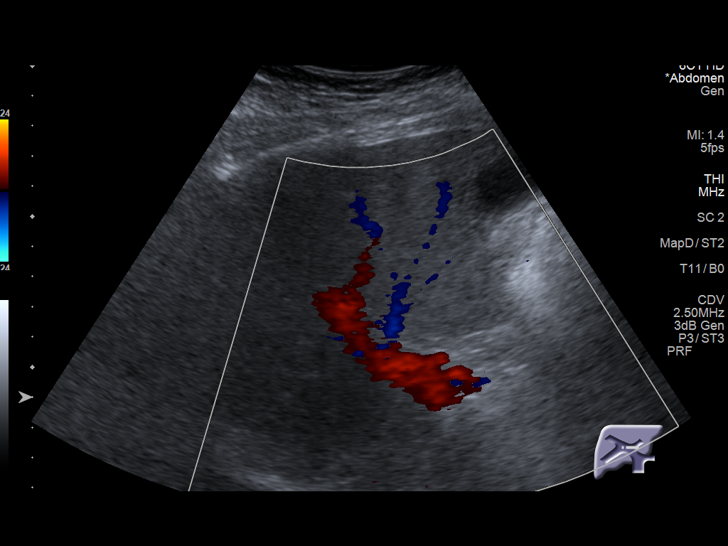

[14 of 25 positions shown; findings below may reference images not displayed]

FINDINGS: Gallbladder:

Gallstones noted measuring up to 18 mm. No wall thickening or
sonographic Murphy sign.

Common bile duct:

Diameter: Normal caliber, 4 mm

Liver:

Increased echotexture compatible with fatty infiltration. No focal
abnormality or biliary ductal dilatation. Portal vein is patent on
color Doppler imaging with normal direction of blood flow towards
the liver.

Other: None.
IMPRESSION: Hepatic steatosis.

Cholelithiasis.  No sonographic evidence of acute cholecystitis.

## 2022-03-21 ENCOUNTER — Ambulatory Visit: Payer: Self-pay

## 2022-03-21 ENCOUNTER — Ambulatory Visit: Payer: Medicare HMO | Admitting: Physical Medicine and Rehabilitation

## 2022-03-21 ENCOUNTER — Encounter: Payer: Self-pay | Admitting: Physical Medicine and Rehabilitation

## 2022-03-21 DIAGNOSIS — M7071 Other bursitis of hip, right hip: Secondary | ICD-10-CM

## 2022-03-21 DIAGNOSIS — M7072 Other bursitis of hip, left hip: Secondary | ICD-10-CM | POA: Diagnosis not present

## 2022-03-21 NOTE — Progress Notes (Signed)
   Marie Farmer - 66 y.o. female MRN [id number]  Date of birth: 08-Jan-1956  Office Visit Note: Visit Date: 03/21/2022 PCP: Glenda Chroman, MD Referred by: Glenda Chroman, MD  Subjective: Chief Complaint  Patient presents with   Right Hip - Pain   Left Hip - Pain   HPI:  Marie Farmer is a 66 y.o. female who comes in today at the request of Barnet Pall, FNP for planned Bilateral ischial bursa injection with fluoroscopic guidance.  The patient has failed conservative care including home exercise, medications, time and activity modification.  This injection will be diagnostic and hopefully therapeutic.  Please see requesting physician notes for further details and justification.   ROS Otherwise per HPI.  Assessment & Plan: Visit Diagnoses:    ICD-10-CM   1. Ischial bursitis, right  M70.71 XR C-ARM NO REPORT    Bilateral Ischial Bursa    2. Ischial bursitis of left side  M70.72 XR C-ARM NO REPORT    Bilateral Ischial Bursa      Plan: No additional findings.   Meds & Orders: No orders of the defined types were placed in this encounter.   Orders Placed This Encounter  Procedures   Bilateral Ischial Bursa   XR C-ARM NO REPORT    Follow-up: Return for visit to requesting provider as needed.   Procedures: Bilateral Ischial Bursa on 03/21/2022 3:22 PM Indications: pain and diagnostic evaluation Details: 22 G 3.5 in needle, fluoroscopy-guided posterior approach  Arthrogram: No  Medications: 4 mL bupivacaine 0.25 %; 40 mg triamcinolone acetonide 40 MG/ML Outcome: tolerated well, no immediate complications  There was excellent flow of contrast outlining the ischial bursa.  There was no vascular flow noted. Procedure, treatment alternatives, risks and benefits explained, specific risks discussed. Consent was given by the patient. Immediately prior to procedure a time out was called to verify the correct patient, procedure, equipment, support staff and site/side marked as  required. Patient was prepped and draped in the usual sterile fashion.          Clinical History: EXAM: MRI LUMBAR SPINE WITHOUT CONTRAST  TECHNIQUE: Multiplanar, multisequence MR imaging of the lumbar spine was performed. No intravenous contrast was administered.  COMPARISON: 02/01/2018  FINDINGS: Segmentation: Standard.  Alignment: Mild scoliosis  Vertebrae: No fracture, evidence of discitis, or aggressive bone lesion.  Conus medullaris and cauda equina: Conus extends to the L2 level. Conus and cauda equina appear normal.  Paraspinal and other soft tissues: Negative for perispinal mass or inflammation  Disc levels:  T12- L1: Unremarkable.  L1-L2: Mild disc space narrowing and bulging  L2-L3: Unremarkable.  L3-L4: Mild disc space narrowing and left eccentric bulging with small annular fissure.  L4-L5: Disc space narrowing and left preferential bulging with inferior foraminal protrusion. No neural compression  L5-S1:Degenerative facet spurring asymmetric to the right. Disc narrowing and mild bulging.  IMPRESSION: Stable lumbar spine degeneration when compared to 2019. No neural impingement or visible inflammation.   Electronically Signed By: Jorje Guild M.D. On: 02/05/2022 13:03     Objective:  VS:  HT:    WT:   BMI:     BP:   HR: bpm  TEMP: ( )  RESP:  Physical Exam   Imaging: No results found.

## 2022-03-21 NOTE — Progress Notes (Signed)
Pt state both hips and buttocks pain. Pt state any movement makes the pain worse. Pt state she takes pain meds to help ease her pain.  Numeric Pain Rating Scale and Functional Assessment Average Pain 5   In the last MONTH (on 0-10 scale) has pain interfered with the following?  1. General activity like being  able to carry out your everyday physical activities such as walking, climbing stairs, carrying groceries, or moving a chair?  Rating(10)    -BT, -Dye Allergies.

## 2022-03-23 DIAGNOSIS — M25579 Pain in unspecified ankle and joints of unspecified foot: Secondary | ICD-10-CM | POA: Diagnosis not present

## 2022-03-23 DIAGNOSIS — M79671 Pain in right foot: Secondary | ICD-10-CM | POA: Diagnosis not present

## 2022-03-23 DIAGNOSIS — M25559 Pain in unspecified hip: Secondary | ICD-10-CM | POA: Diagnosis not present

## 2022-03-23 DIAGNOSIS — M25561 Pain in right knee: Secondary | ICD-10-CM | POA: Diagnosis not present

## 2022-03-23 DIAGNOSIS — M25571 Pain in right ankle and joints of right foot: Secondary | ICD-10-CM | POA: Diagnosis not present

## 2022-03-23 DIAGNOSIS — M79642 Pain in left hand: Secondary | ICD-10-CM | POA: Diagnosis not present

## 2022-03-23 DIAGNOSIS — M79672 Pain in left foot: Secondary | ICD-10-CM | POA: Diagnosis not present

## 2022-03-23 DIAGNOSIS — M79641 Pain in right hand: Secondary | ICD-10-CM | POA: Diagnosis not present

## 2022-03-23 DIAGNOSIS — M549 Dorsalgia, unspecified: Secondary | ICD-10-CM | POA: Diagnosis not present

## 2022-03-23 DIAGNOSIS — M25562 Pain in left knee: Secondary | ICD-10-CM | POA: Diagnosis not present

## 2022-03-23 DIAGNOSIS — M25572 Pain in left ankle and joints of left foot: Secondary | ICD-10-CM | POA: Diagnosis not present

## 2022-03-23 DIAGNOSIS — M059 Rheumatoid arthritis with rheumatoid factor, unspecified: Secondary | ICD-10-CM | POA: Diagnosis not present

## 2022-03-24 ENCOUNTER — Ambulatory Visit: Payer: Medicare HMO | Admitting: Physical Medicine and Rehabilitation

## 2022-03-24 ENCOUNTER — Encounter: Payer: Self-pay | Admitting: Physical Medicine and Rehabilitation

## 2022-03-24 DIAGNOSIS — R202 Paresthesia of skin: Secondary | ICD-10-CM | POA: Diagnosis not present

## 2022-03-24 NOTE — Progress Notes (Signed)
Pt state left shoulder pain and numbness that travels down to the palm of her left hand. Pt state the pain wakes her up at night and cleaning around the house the pain gets worse. Pt state she takes pain meds tohelp ease her pain. Pt state she right handed.  Numeric Pain Rating Scale and Functional Assessment Average Pain 0   In the last MONTH (on 0-10 scale) has pain interfered with the following?  1. General activity like being  able to carry out your everyday physical activities such as walking, climbing stairs, carrying groceries, or moving a chair?  Rating(10)    -BT,

## 2022-03-28 ENCOUNTER — Encounter: Payer: Self-pay | Admitting: Internal Medicine

## 2022-03-28 ENCOUNTER — Ambulatory Visit: Payer: Medicare HMO | Admitting: Internal Medicine

## 2022-03-30 DIAGNOSIS — H1031 Unspecified acute conjunctivitis, right eye: Secondary | ICD-10-CM | POA: Diagnosis not present

## 2022-03-30 DIAGNOSIS — L03213 Periorbital cellulitis: Secondary | ICD-10-CM | POA: Diagnosis not present

## 2022-03-30 DIAGNOSIS — H16223 Keratoconjunctivitis sicca, not specified as Sjogren's, bilateral: Secondary | ICD-10-CM | POA: Diagnosis not present

## 2022-03-31 NOTE — Procedures (Signed)
EMG & NCV Findings: Evaluation of the left ulnar sensory nerve showed reduced amplitude (13.5 V).  All remaining nerves (as indicated in the following tables) were within normal limits.    All examined muscles (as indicated in the following table) showed no evidence of electrical instability.    Impression: Essentially NORMAL electrodiagnostic study of the left upper limb.  There is no significant electrodiagnostic evidence of nerve entrapment, brachial plexopathy or cervical radiculopathy.    As you know, purely sensory or demyelinating radiculopathies and chemical radiculitis may not be detected with this particular electrodiagnostic study. **This electrodiagnostic study cannot rule out small fiber polyneuropathy and dysesthesias from central pain syndromes such as stroke or central pain sensitization syndromes such as fibromyalgia.  Myotomal referral pain from trigger points is also not excluded.  Recommendations: 1.  Continue current management of symptoms.  ___________________________ Laurence Spates FAAPMR Board Certified, American Board of Physical Medicine and Rehabilitation    Nerve Conduction Studies Anti Sensory Summary Table   Stim Site NR Peak (ms) Norm Peak (ms) P-T Amp (V) Norm P-T Amp Site1 Site2 Delta-P (ms) Dist (cm) Vel (m/s) Norm Vel (m/s)  Left Median Acr Palm Anti Sensory (2nd Digit)  31.2C  Wrist    3.5 <3.6 25.9 >10 Wrist Palm 1.5 0.0    Palm    2.0 <2.0 5.1         Left Radial Anti Sensory (Base 1st Digit)  31.7C  Wrist    2.2 <3.1 24.2  Wrist Base 1st Digit 2.2 0.0    Left Ulnar Anti Sensory (5th Digit)  31.9C  Wrist    3.3 <3.7 *13.5 >15.0 Wrist 5th Digit 3.3 14.0 42 >38   Motor Summary Table   Stim Site NR Onset (ms) Norm Onset (ms) O-P Amp (mV) Norm O-P Amp Site1 Site2 Delta-0 (ms) Dist (cm) Vel (m/s) Norm Vel (m/s)  Left Median Motor (Abd Poll Brev)  32C  Wrist    3.1 <4.2 8.0 >5 Elbow Wrist 3.6 19.3 54 >50  Elbow    6.7  7.6         Left Ulnar  Motor (Abd Dig Min)  32.1C  Wrist    2.7 <4.2 8.3 >3 B Elbow Wrist 2.9 17.0 59 >53  B Elbow    5.6  8.9  A Elbow B Elbow 1.8 10.0 56 >53  A Elbow    7.4  5.7          EMG   Side Muscle Nerve Root Ins Act Fibs Psw Amp Dur Poly Recrt Int Fraser Din Comment  Left Abd Poll Brev Median C8-T1 Nml Nml Nml Nml Nml 0 Nml Nml   Left 1stDorInt Ulnar C8-T1 Nml Nml Nml Nml Nml 0 Nml Nml   Left PronatorTeres Median C6-7 Nml Nml Nml Nml Nml 0 Nml Nml   Left Biceps Musculocut C5-6 Nml Nml Nml Nml Nml 0 Nml Nml   Left Deltoid Axillary C5-6 Nml Nml Nml Nml Nml 0 Nml Nml     Nerve Conduction Studies Anti Sensory Left/Right Comparison   Stim Site L Lat (ms) R Lat (ms) L-R Lat (ms) L Amp (V) R Amp (V) L-R Amp (%) Site1 Site2 L Vel (m/s) R Vel (m/s) L-R Vel (m/s)  Median Acr Palm Anti Sensory (2nd Digit)  31.2C  Wrist 3.5   25.9   Wrist Palm     Palm 2.0   5.1         Radial Anti Sensory (Base 1st Digit)  31.7C  Wrist 2.2   24.2   Wrist Base 1st Digit     Ulnar Anti Sensory (5th Digit)  31.9C  Wrist 3.3   *13.5   Wrist 5th Digit 42     Motor Left/Right Comparison   Stim Site L Lat (ms) R Lat (ms) L-R Lat (ms) L Amp (mV) R Amp (mV) L-R Amp (%) Site1 Site2 L Vel (m/s) R Vel (m/s) L-R Vel (m/s)  Median Motor (Abd Poll Brev)  32C  Wrist 3.1   8.0   Elbow Wrist 54    Elbow 6.7   7.6         Ulnar Motor (Abd Dig Min)  32.1C  Wrist 2.7   8.3   B Elbow Wrist 59    B Elbow 5.6   8.9   A Elbow B Elbow 56    A Elbow 7.4   5.7            Waveforms:

## 2022-03-31 NOTE — Progress Notes (Signed)
Marie Farmer - 66 y.o. female MRN [id number]  Date of birth: Sep 23, 1955  Office Visit Note: Visit Date: 03/24/2022 PCP: Glenda Chroman, MD Referred by: Glenda Chroman, MD  Subjective: Chief Complaint  Patient presents with   Left Shoulder - Pain, Numbness   Left Arm - Pain, Numbness   Left Hand - Pain, Numbness   HPI:  Marie Farmer is a 66 y.o. female who comes in today at the request of Barnet Pall, FNP for electrodiagnostic study of the Left upper extremities.  Patient is Right hand dominant.  She complains of chronic worsening left shoulder pain and numbness that radiates down into the left hand in a nondermatomal somewhat global distribution.  She has worsening with pain at night and cleaning the house and doing activities.  Prior carpal tunnel release on the right.  No recent electrodiagnostic studies.   ROS Otherwise per HPI.  Assessment & Plan: Visit Diagnoses:    ICD-10-CM   1. Paresthesia of skin  R20.2 NCV with EMG (electromyography)      Plan: Impression: Essentially NORMAL electrodiagnostic study of the left upper limb.  There is no significant electrodiagnostic evidence of nerve entrapment, brachial plexopathy or cervical radiculopathy.    As you know, purely sensory or demyelinating radiculopathies and chemical radiculitis may not be detected with this particular electrodiagnostic study. **This electrodiagnostic study cannot rule out small fiber polyneuropathy and dysesthesias from central pain syndromes such as stroke or central pain sensitization syndromes such as fibromyalgia.  Myotomal referral pain from trigger points is also not excluded.  Recommendations: 1.  Continue current management of symptoms.  Meds & Orders: No orders of the defined types were placed in this encounter.   Orders Placed This Encounter  Procedures   NCV with EMG (electromyography)    Follow-up: Return for visit to requesting provider as needed.   Procedures: No procedures  performed  EMG & NCV Findings: Evaluation of the left ulnar sensory nerve showed reduced amplitude (13.5 V).  All remaining nerves (as indicated in the following tables) were within normal limits.    All examined muscles (as indicated in the following table) showed no evidence of electrical instability.    Impression: Essentially NORMAL electrodiagnostic study of the left upper limb.  There is no significant electrodiagnostic evidence of nerve entrapment, brachial plexopathy or cervical radiculopathy.    As you know, purely sensory or demyelinating radiculopathies and chemical radiculitis may not be detected with this particular electrodiagnostic study. **This electrodiagnostic study cannot rule out small fiber polyneuropathy and dysesthesias from central pain syndromes such as stroke or central pain sensitization syndromes such as fibromyalgia.  Myotomal referral pain from trigger points is also not excluded.  Recommendations: 1.  Continue current management of symptoms.  ___________________________ Laurence Spates FAAPMR Board Certified, American Board of Physical Medicine and Rehabilitation    Nerve Conduction Studies Anti Sensory Summary Table   Stim Site NR Peak (ms) Norm Peak (ms) P-T Amp (V) Norm P-T Amp Site1 Site2 Delta-P (ms) Dist (cm) Vel (m/s) Norm Vel (m/s)  Left Median Acr Palm Anti Sensory (2nd Digit)  31.2C  Wrist    3.5 <3.6 25.9 >10 Wrist Palm 1.5 0.0    Palm    2.0 <2.0 5.1         Left Radial Anti Sensory (Base 1st Digit)  31.7C  Wrist    2.2 <3.1 24.2  Wrist Base 1st Digit 2.2 0.0    Left Ulnar Anti Sensory (5th Digit)  31.9C  Wrist    3.3 <3.7 *13.5 >15.0 Wrist 5th Digit 3.3 14.0 42 >38   Motor Summary Table   Stim Site NR Onset (ms) Norm Onset (ms) O-P Amp (mV) Norm O-P Amp Site1 Site2 Delta-0 (ms) Dist (cm) Vel (m/s) Norm Vel (m/s)  Left Median Motor (Abd Poll Brev)  32C  Wrist    3.1 <4.2 8.0 >5 Elbow Wrist 3.6 19.3 54 >50  Elbow    6.7  7.6          Left Ulnar Motor (Abd Dig Min)  32.1C  Wrist    2.7 <4.2 8.3 >3 B Elbow Wrist 2.9 17.0 59 >53  B Elbow    5.6  8.9  A Elbow B Elbow 1.8 10.0 56 >53  A Elbow    7.4  5.7          EMG   Side Muscle Nerve Root Ins Act Fibs Psw Amp Dur Poly Recrt Int Fraser Din Comment  Left Abd Poll Brev Median C8-T1 Nml Nml Nml Nml Nml 0 Nml Nml   Left 1stDorInt Ulnar C8-T1 Nml Nml Nml Nml Nml 0 Nml Nml   Left PronatorTeres Median C6-7 Nml Nml Nml Nml Nml 0 Nml Nml   Left Biceps Musculocut C5-6 Nml Nml Nml Nml Nml 0 Nml Nml   Left Deltoid Axillary C5-6 Nml Nml Nml Nml Nml 0 Nml Nml     Nerve Conduction Studies Anti Sensory Left/Right Comparison   Stim Site L Lat (ms) R Lat (ms) L-R Lat (ms) L Amp (V) R Amp (V) L-R Amp (%) Site1 Site2 L Vel (m/s) R Vel (m/s) L-R Vel (m/s)  Median Acr Palm Anti Sensory (2nd Digit)  31.2C  Wrist 3.5   25.9   Wrist Palm     Palm 2.0   5.1         Radial Anti Sensory (Base 1st Digit)  31.7C  Wrist 2.2   24.2   Wrist Base 1st Digit     Ulnar Anti Sensory (5th Digit)  31.9C  Wrist 3.3   *13.5   Wrist 5th Digit 42     Motor Left/Right Comparison   Stim Site L Lat (ms) R Lat (ms) L-R Lat (ms) L Amp (mV) R Amp (mV) L-R Amp (%) Site1 Site2 L Vel (m/s) R Vel (m/s) L-R Vel (m/s)  Median Motor (Abd Poll Brev)  32C  Wrist 3.1   8.0   Elbow Wrist 54    Elbow 6.7   7.6         Ulnar Motor (Abd Dig Min)  32.1C  Wrist 2.7   8.3   B Elbow Wrist 59    B Elbow 5.6   8.9   A Elbow B Elbow 56    A Elbow 7.4   5.7            Waveforms:             Clinical History: EXAM: MRI LUMBAR SPINE WITHOUT CONTRAST  TECHNIQUE: Multiplanar, multisequence MR imaging of the lumbar spine was performed. No intravenous contrast was administered.  COMPARISON: 02/01/2018  FINDINGS: Segmentation: Standard.  Alignment: Mild scoliosis  Vertebrae: No fracture, evidence of discitis, or aggressive bone lesion.  Conus medullaris and cauda equina: Conus extends to the L2 level. Conus  and cauda equina appear normal.  Paraspinal and other soft tissues: Negative for perispinal mass or inflammation  Disc levels:  T12- L1: Unremarkable.  L1-L2: Mild disc space narrowing and bulging  L2-L3: Unremarkable.  L3-L4: Mild disc space narrowing and left eccentric bulging with small annular fissure.  L4-L5: Disc space narrowing and left preferential bulging with inferior foraminal protrusion. No neural compression  L5-S1:Degenerative facet spurring asymmetric to the right. Disc narrowing and mild bulging.  IMPRESSION: Stable lumbar spine degeneration when compared to 2019. No neural impingement or visible inflammation.   Electronically Signed By: Jorje Guild M.D. On: 02/05/2022 13:03     Objective:  VS:  HT:    WT:   BMI:     BP:   HR: bpm  TEMP: ( )  RESP:  Physical Exam Musculoskeletal:        General: No swelling, tenderness or deformity.     Comments: Inspection reveals no atrophy of the bilateral APB or FDI or hand intrinsics. There is no swelling, color changes, allodynia or dystrophic changes. There is 5 out of 5 strength in the bilateral wrist extension, finger abduction and long finger flexion. There is intact sensation to light touch in all dermatomal and peripheral nerve distributions.  There is a negative Phalen's test bilaterally. There is a negative Hoffmann's test bilaterally.  Skin:    General: Skin is warm and dry.     Findings: No erythema or rash.  Neurological:     General: No focal deficit present.     Mental Status: She is alert and oriented to person, place, and time.     Motor: No weakness or abnormal muscle tone.     Coordination: Coordination normal.  Psychiatric:        Mood and Affect: Mood normal.        Behavior: Behavior normal.      Imaging: No results found.

## 2022-04-04 ENCOUNTER — Ambulatory Visit: Payer: Medicare HMO | Admitting: Podiatry

## 2022-04-04 ENCOUNTER — Ambulatory Visit (INDEPENDENT_AMBULATORY_CARE_PROVIDER_SITE_OTHER): Payer: Medicare HMO

## 2022-04-04 DIAGNOSIS — M21619 Bunion of unspecified foot: Secondary | ICD-10-CM

## 2022-04-04 DIAGNOSIS — M7752 Other enthesopathy of left foot: Secondary | ICD-10-CM | POA: Diagnosis not present

## 2022-04-04 DIAGNOSIS — M21611 Bunion of right foot: Secondary | ICD-10-CM

## 2022-04-04 DIAGNOSIS — M7750 Other enthesopathy of unspecified foot: Secondary | ICD-10-CM | POA: Diagnosis not present

## 2022-04-04 NOTE — Progress Notes (Addendum)
Subjective: Chief Complaint  Patient presents with   Foot Pain    Bilateral footnd ankle  pain started when pt was 66 years old,  bunion, throbbing pain, Rate 10 out of 10, the pain comes and goes Xrays were taken today s   66 year old female presents the office today for bilateral foot pain.  She is in acute pain pointing submetatarsal 2 on the right foot as well as the inside portion of the ankle.  She also gets pain on the right foot bunion.  This is resulted in the callus.  No recent injury or change otherwise.   Objective: AAO x3, NAD-wear shoes cut out in the toe box bilaterally. DP/PT pulses palpable bilaterally, CRT less than 3 seconds Mild callus on the medial hallux IPJ right foot. On the right foot there is residual bunion deformity present.  There is tenderness submetatarsal 3 with prominent metatarsal heads.  On the right side there is some mild tenderness on the tibialis anterior as well as the left foot.  Appears both tendons clinically intact.  Mild discomfort of the ankle but no specific area pinpoint tenderness bilaterally.  No pain or crepitation with ankle joint range of motion. Hammertoes present. No pain with calf compression, swelling, warmth, erythema  Assessment: Tendinitis bilateral ankles, metatarsalgia, bunion, hammertoes.  Plan: -All treatment options discussed with the patient including all alternatives, risks, complications.  -We discussed wearing extra-depth or double depth shoe. -Offloading pad to the bunion -Debrided callus without any complications or bleeding.  Recommend moisturizer and offloading. -Discussed Voltaren gel for the ankle. -Discussed stretching, rehab exercises to be performed at home.  Offered physical therapy today.  She will start with home exercises. -No improvement consider MRI or physical therapy.  Return in about 4 weeks (around 05/02/2022).  Trula Slade DPM

## 2022-04-04 NOTE — Patient Instructions (Addendum)
You can use VOLTAREN GEL on the ankles as needed    Look at getting and [place] to help. Places like shoe market should have these.     Ankle Sprain, Phase I Rehab An ankle sprain is an injury to the ligaments of your ankle. Ankle sprains cause stiffness, loss of motion, and loss of strength. Ask your health care provider which exercises are safe for you. Do exercises exactly as told by your health care provider and adjust them as directed. It is normal to feel mild stretching, pulling, tightness, or discomfort as you do these exercises. Stop right away if you feel sudden pain or your pain gets worse. Do not begin these exercises until told by your health care provider. Stretching and range-of-motion exercises These exercises warm up your muscles and joints and improve the movement and flexibility of your lower leg and ankle. These exercises also help to relieve pain and stiffness. Gastroc and soleus stretch This exercise is also called a calf stretch. It stretches the muscles in the back of the lower leg. These muscles are the gastrocnemius, or gastroc, and the soleus. Sit on the floor with your left / right leg extended. Loop a belt or towel around the ball of your left / right foot. The ball of your foot is on the walking surface, right under your toes. Keep your left / right ankle and foot relaxed and keep your knee straight while you use the belt or towel to pull your foot toward you. You should feel a gentle stretch behind your calf or knee in your gastroc muscle. Hold this position for __________ seconds, then release to the starting position. Repeat the exercise with your knee bent. You can put a pillow or a rolled bath towel under your knee to support it. You should feel a stretch deep in your calf in the soleus muscle or at your Achilles tendon. Repeat __________ times. Complete this exercise __________ times a day. Ankle alphabet  Sit with your left / right  leg supported at the lower leg. Do not rest your foot on anything. Make sure your foot has room to move freely. Think of your left / right foot as a paintbrush. Move your foot to trace each letter of the alphabet in the air. Keep your hip and knee still while you trace. Make the letters as large as you can without feeling discomfort. Trace every letter from A to Z. Repeat __________ times. Complete this exercise __________ times a day. Strengthening exercises These exercises build strength and endurance in your ankle and lower leg. Endurance is the ability to use your muscles for a long time, even after they get tired. Ankle dorsiflexion  Secure a rubber exercise band or tube to an object, such as a table leg, that will stay still when the band is pulled. Secure the other end around your left / right foot. Sit on the floor facing the object, with your left / right leg extended. The band or tube should be slightly tense when your foot is relaxed. Slowly bring your foot toward you, bringing the top of your foot toward your shin (dorsiflexion), and pulling the band tighter. Hold this position for __________ seconds. Slowly return your foot to the starting position. Repeat __________ times. Complete this exercise __________ times a day. Ankle plantar flexion  Sit on the floor with your left / right leg extended. Loop a rubber exercise tube or band around the ball of your  left / right foot. The ball of your foot is on the walking surface, right under your toes. Hold the ends of the band or tube in your hands. The band or tube should be slightly tense when your foot is relaxed. Slowly point your foot and toes downward to tilt the top of your foot away from your shin (plantar flexion). Hold this position for __________ seconds. Slowly return your foot to the starting position. Repeat __________ times. Complete this exercise __________ times a day. Ankle eversion Sit on the floor with your legs  straight out in front of you. Loop a rubber exercise band or tube around the ball of your left / right foot. The ball of your foot is on the walking surface, right under your toes. Hold the ends of the band in your hands, or secure the band to a stable object. The band or tube should be slightly tense when your foot is relaxed. Slowly push your foot outward, away from your other leg (eversion). Hold this position for __________ seconds. Slowly return your foot to the starting position. Repeat __________ times. Complete this exercise __________ times a day. This information is not intended to replace advice given to you by your health care provider. Make sure you discuss any questions you have with your health care provider. Document Revised: 10/21/2020 Document Reviewed: 10/21/2020 Elsevier Patient Education  [place].

## 2022-04-05 MED ORDER — TRIAMCINOLONE ACETONIDE 40 MG/ML IJ SUSP
40.0000 mg | INTRAMUSCULAR | Status: AC | PRN
  Administered 2022-03-21: 40 mg via INTRA_ARTICULAR

## 2022-04-05 MED ORDER — BUPIVACAINE HCL 0.25 % IJ SOLN
4.0000 mL | INTRAMUSCULAR | Status: AC | PRN
  Administered 2022-03-21: 4 mL via INTRA_ARTICULAR

## 2022-04-11 ENCOUNTER — Telehealth: Payer: Self-pay | Admitting: Podiatry

## 2022-04-11 NOTE — Telephone Encounter (Signed)
Pt is asking is she can get a Rx for pain to hold her over till her apt 8/7  Please advise

## 2022-04-12 ENCOUNTER — Other Ambulatory Visit: Payer: Self-pay | Admitting: Podiatry

## 2022-04-12 MED ORDER — TRAMADOL HCL 50 MG PO TABS: 50.0000 mg | ORAL_TABLET | Freq: Two times a day (BID) | ORAL | 0 refills | Status: AC | PRN

## 2022-04-17 ENCOUNTER — Ambulatory Visit (INDEPENDENT_AMBULATORY_CARE_PROVIDER_SITE_OTHER): Payer: Medicare HMO | Admitting: Podiatry

## 2022-04-17 DIAGNOSIS — M7750 Other enthesopathy of unspecified foot: Secondary | ICD-10-CM | POA: Diagnosis not present

## 2022-04-17 DIAGNOSIS — M21619 Bunion of unspecified foot: Secondary | ICD-10-CM | POA: Diagnosis not present

## 2022-04-17 NOTE — Progress Notes (Unsigned)
Subjective: Chief Complaint  Patient presents with   Foot Pain    Having swelling and pain in left foot and ankle    66 year old female presents the office with above complaint.  She states that she noticed a knot form and she states that she is having some discomfort with moving her ankle up and down.  She said the knot is not as prominent as what it was.  No recent injury or trauma that she reports.  No bruising.   Objective: AAO x3, NAD-wear shoes cut out in the toe box bilaterally. DP/PT pulses palpable bilaterally, CRT less than 3 seconds Mild callus on the medial hallux IPJ right foot. On the right foot there is residual bunion deformity present.  Prominence of metatarsal heads.  There are some mild discomfort along more the medial aspect of the foot.  She was having some tenderness on the tibialis anterior tendon but clinically the tendon appears to be intact.  Also on the posterior tibial tendon distally is where she has had some discomfort but clinically the tendon appears to be intact.  There is localized edema to the medial aspect of the foot.   Hammertoes present. No pain with calf compression, swelling, warmth, erythema  Assessment: Tendinitis bilateral ankles, metatarsalgia, bunion, hammertoes.  Plan: -All treatment options discussed with the patient including all alternatives, risks, complications.  -At this point recommend mobilization.  Chart reports was dispensed.  If this is not helping recommended immobilization in the cam boot which she already has at home. -Continue ice, Voltaren gel. -If no improvement recommend MRI to rule out tendon tear.  Return in about 4 weeks (around 05/15/2022).  Marie Farmer DPM  *Patient called back the next day stating swelling has worsened.  Recommended elevation in cam boot and MRI has been ordered to rule out tendon tear.

## 2022-04-18 ENCOUNTER — Other Ambulatory Visit: Payer: Self-pay | Admitting: Podiatry

## 2022-04-18 ENCOUNTER — Telehealth: Payer: Self-pay

## 2022-04-18 DIAGNOSIS — M7750 Other enthesopathy of unspecified foot: Secondary | ICD-10-CM

## 2022-04-20 NOTE — Telephone Encounter (Signed)
No further notes needed

## 2022-04-24 ENCOUNTER — Ambulatory Visit
Admission: RE | Admit: 2022-04-24 | Discharge: 2022-04-24 | Disposition: A | Discharge status: Home / Self Care | Payer: Medicare HMO | Source: Ambulatory Visit | Attending: Podiatry | Admitting: Podiatry

## 2022-04-24 DIAGNOSIS — S86312A Strain of muscle(s) and tendon(s) of peroneal muscle group at lower leg level, left leg, initial encounter: Secondary | ICD-10-CM | POA: Diagnosis not present

## 2022-04-24 DIAGNOSIS — M7750 Other enthesopathy of unspecified foot: Secondary | ICD-10-CM

## 2022-04-25 NOTE — Telephone Encounter (Signed)
Prior authorization has been started just waiting on a response from insurance

## 2022-04-26 ENCOUNTER — Other Ambulatory Visit: Payer: Self-pay | Admitting: Podiatry

## 2022-04-26 ENCOUNTER — Telehealth: Payer: Self-pay | Admitting: *Deleted

## 2022-04-26 MED ORDER — METHYLPREDNISOLONE 4 MG PO TBPK: ORAL_TABLET | ORAL | 0 refills | Status: DC

## 2022-04-26 NOTE — Telephone Encounter (Signed)
Patient is requesting her MRI results from 08/14, please advise.

## 2022-04-26 NOTE — Progress Notes (Signed)
Medrol dose pack prescribed

## 2022-04-28 NOTE — Telephone Encounter (Signed)
Scheduled 08/28

## 2022-05-02 ENCOUNTER — Ambulatory Visit: Payer: Medicare HMO | Admitting: Podiatry

## 2022-05-05 DIAGNOSIS — M069 Rheumatoid arthritis, unspecified: Secondary | ICD-10-CM | POA: Diagnosis not present

## 2022-05-05 DIAGNOSIS — F339 Major depressive disorder, recurrent, unspecified: Secondary | ICD-10-CM | POA: Diagnosis not present

## 2022-05-05 DIAGNOSIS — I10 Essential (primary) hypertension: Secondary | ICD-10-CM | POA: Diagnosis not present

## 2022-05-05 DIAGNOSIS — M549 Dorsalgia, unspecified: Secondary | ICD-10-CM | POA: Diagnosis not present

## 2022-05-05 DIAGNOSIS — Z299 Encounter for prophylactic measures, unspecified: Secondary | ICD-10-CM | POA: Diagnosis not present

## 2022-05-08 ENCOUNTER — Ambulatory Visit: Payer: Medicare HMO | Admitting: Podiatry

## 2022-05-08 ENCOUNTER — Ambulatory Visit (INDEPENDENT_AMBULATORY_CARE_PROVIDER_SITE_OTHER): Payer: Medicare HMO

## 2022-05-08 ENCOUNTER — Encounter: Payer: Self-pay | Admitting: Podiatry

## 2022-05-08 DIAGNOSIS — M7752 Other enthesopathy of left foot: Secondary | ICD-10-CM

## 2022-05-08 DIAGNOSIS — M7750 Other enthesopathy of unspecified foot: Secondary | ICD-10-CM

## 2022-05-08 MED ORDER — TRIAMCINOLONE ACETONIDE 10 MG/ML IJ SUSP
10.0000 mg | Freq: Once | INTRAMUSCULAR | Status: AC
  Administered 2022-05-08: 10 mg

## 2022-05-09 NOTE — Progress Notes (Signed)
Subjective:   Patient ID: Marie Farmer, female   DOB: 66 y.o.   MRN: [id number]   HPI Patient presents stating that she has had some increasing discomfort in her left ankle and she is interested in something to try to help her at least temporarily neuro   ROS      Objective:  Physical Exam  Vascular status intact with inflammation of the dorsal ankle left and into the sinus tarsi with fluid buildup     Assessment:  Inflammatory capsulitis with inflammation fluid buildup     Plan:  Sterile prep went ahead today injected sinus tarsi into the ankle joint 3 mg Kenalog 5 mg Xylocaine and reappoint to recheck

## 2022-06-02 DIAGNOSIS — Z0000 Encounter for general adult medical examination without abnormal findings: Secondary | ICD-10-CM | POA: Diagnosis not present

## 2022-06-02 DIAGNOSIS — Z1339 Encounter for screening examination for other mental health and behavioral disorders: Secondary | ICD-10-CM | POA: Diagnosis not present

## 2022-06-02 DIAGNOSIS — E7800 Pure hypercholesterolemia, unspecified: Secondary | ICD-10-CM | POA: Diagnosis not present

## 2022-06-02 DIAGNOSIS — Z6836 Body mass index (BMI) 36.0-36.9, adult: Secondary | ICD-10-CM | POA: Diagnosis not present

## 2022-06-02 DIAGNOSIS — Z79899 Other long term (current) drug therapy: Secondary | ICD-10-CM | POA: Diagnosis not present

## 2022-06-02 DIAGNOSIS — I10 Essential (primary) hypertension: Secondary | ICD-10-CM | POA: Diagnosis not present

## 2022-06-02 DIAGNOSIS — Z299 Encounter for prophylactic measures, unspecified: Secondary | ICD-10-CM | POA: Diagnosis not present

## 2022-06-02 DIAGNOSIS — E039 Hypothyroidism, unspecified: Secondary | ICD-10-CM | POA: Diagnosis not present

## 2022-06-02 DIAGNOSIS — Z789 Other specified health status: Secondary | ICD-10-CM | POA: Diagnosis not present

## 2022-06-02 DIAGNOSIS — Z7189 Other specified counseling: Secondary | ICD-10-CM | POA: Diagnosis not present

## 2022-06-02 DIAGNOSIS — R252 Cramp and spasm: Secondary | ICD-10-CM | POA: Diagnosis not present

## 2022-06-07 ENCOUNTER — Other Ambulatory Visit: Payer: Self-pay | Admitting: Podiatry

## 2022-06-07 DIAGNOSIS — M21619 Bunion of unspecified foot: Secondary | ICD-10-CM

## 2022-06-07 DIAGNOSIS — M7750 Other enthesopathy of unspecified foot: Secondary | ICD-10-CM

## 2022-06-12 ENCOUNTER — Ambulatory Visit: Payer: Medicare HMO | Admitting: Podiatry

## 2022-06-12 DIAGNOSIS — M25579 Pain in unspecified ankle and joints of unspecified foot: Secondary | ICD-10-CM | POA: Diagnosis not present

## 2022-06-12 DIAGNOSIS — M47819 Spondylosis without myelopathy or radiculopathy, site unspecified: Secondary | ICD-10-CM | POA: Diagnosis not present

## 2022-06-12 DIAGNOSIS — M25559 Pain in unspecified hip: Secondary | ICD-10-CM | POA: Diagnosis not present

## 2022-06-12 DIAGNOSIS — M549 Dorsalgia, unspecified: Secondary | ICD-10-CM | POA: Diagnosis not present

## 2022-06-12 DIAGNOSIS — M79671 Pain in right foot: Secondary | ICD-10-CM | POA: Diagnosis not present

## 2022-06-12 DIAGNOSIS — M059 Rheumatoid arthritis with rheumatoid factor, unspecified: Secondary | ICD-10-CM | POA: Diagnosis not present

## 2022-06-16 ENCOUNTER — Ambulatory Visit: Payer: Medicare HMO | Admitting: Podiatry

## 2022-06-16 DIAGNOSIS — M949 Disorder of cartilage, unspecified: Secondary | ICD-10-CM

## 2022-06-16 DIAGNOSIS — M7750 Other enthesopathy of unspecified foot: Secondary | ICD-10-CM | POA: Diagnosis not present

## 2022-06-16 DIAGNOSIS — M7752 Other enthesopathy of left foot: Secondary | ICD-10-CM

## 2022-06-16 DIAGNOSIS — M899 Disorder of bone, unspecified: Secondary | ICD-10-CM

## 2022-06-16 NOTE — Patient Instructions (Signed)

## 2022-06-16 NOTE — Progress Notes (Signed)
Subjective: Chief Complaint  Patient presents with   Foot Pain    Patient came in for a bilateral ankle pain follow-up, patient is having more pain then the last visit, Early: resting, Icing, Voltaren gel, new shoes, pain med     She states it is not as bad as it was when she first started coming here. It is starting to hurt on the right side as well. She points to the anterior medial ankle joint where it hurts but also the muscles in the legs. The last injection was done by Dr. Paulla Dolly and it did not help much. The oral predinose helped some.  She points on the anterior ankle joint where she is getting discomfort.   Objective: AAO x3, NAD DP/PT pulses palpable bilaterally, CRT less than 3 seconds There is tenderness palpation on the anterior medial aspect of the ankle joint on the left side.  There is no specific point tenderness.  No significant pain with lateral today but she does suggest that he gets into pain to this area at times.  Clinically tendons appear intact.  MMT 5/5. No pain with calf compression, swelling, warmth, erythema  Assessment: Osteochondral lesion left ankle, peroneal tendon tear  Plan: -All treatment options discussed with the patient including all alternatives, risks, complications.  -We long discussion today in regards to the etiology as well as reviewing her MRI.  We discussed both conservative as well as surgical treatment options.  Today we performed an injection to the anterior ankle joint.  Ankle brace as needed. -Patient encouraged to call the office with any questions, concerns, change in symptoms.    Procedure: Injection intermediate joint Discussed alternatives, risks, complications and verbal consent was obtained.  Location: Left anterior medial ankle joint Skin Prep: Betadine. Injectate: 0.5cc 0.5% marcaine plain, 0.5 cc 2% lidocaine plain and, 1 cc kenalog 10. Disposition: Patient tolerated procedure well. Injection site dressed with a band-aid.   Post-injection care was discussed and return precautions discussed.   Return in about 4 weeks (around 07/14/2022).  Trula Slade DPM

## 2022-06-19 ENCOUNTER — Telehealth: Payer: Self-pay | Admitting: *Deleted

## 2022-06-19 NOTE — Telephone Encounter (Signed)
Patient is calling because the  front of the left leg is hurting, feels hot,tingling back of leg which comes and goes. She is icing and trying to wear boot but unsuccessful. Please advise.

## 2022-06-22 DIAGNOSIS — E7800 Pure hypercholesterolemia, unspecified: Secondary | ICD-10-CM | POA: Diagnosis not present

## 2022-06-22 DIAGNOSIS — I10 Essential (primary) hypertension: Secondary | ICD-10-CM | POA: Diagnosis not present

## 2022-06-22 DIAGNOSIS — Z299 Encounter for prophylactic measures, unspecified: Secondary | ICD-10-CM | POA: Diagnosis not present

## 2022-06-26 NOTE — Telephone Encounter (Signed)
Patient has been scheduled for upcoming appointment on 24 th.

## 2022-07-04 ENCOUNTER — Ambulatory Visit: Payer: Medicare HMO | Admitting: Podiatry

## 2022-07-04 DIAGNOSIS — M7750 Other enthesopathy of unspecified foot: Secondary | ICD-10-CM | POA: Diagnosis not present

## 2022-07-04 DIAGNOSIS — M7752 Other enthesopathy of left foot: Secondary | ICD-10-CM | POA: Diagnosis not present

## 2022-07-04 DIAGNOSIS — M792 Neuralgia and neuritis, unspecified: Secondary | ICD-10-CM

## 2022-07-04 NOTE — Patient Instructions (Signed)
Increase gabapentin to 600 mg four times a day.

## 2022-07-04 NOTE — Progress Notes (Signed)
Subjective: Chief Complaint  Patient presents with   Foot Pain    Patient acme in today for bilateral foot and ankle pain, left is worse than the right, patient is still having pain, rate of pain 10 out of 10, unable to walk more than 30 mins,    66 year old female presents the above complaints.  She states that she still having pain.  She is actually describing burning and pain in the leg as well.  She has a history of back issues and she is on gabapentin for her leg nerve pain but she states this feels different.  No swelling to the leg.  No recent injuries.  Minimal improvement from the injection last appointment only for couple of days.   Objective: AAO x3, NAD DP/PT pulses palpable bilaterally, CRT less than 3 seconds There is tenderness palpation on the anterior medial aspect of the ankle joint on the left side.  She also has discomfort along the lateral aspect of the ankle and the course of the peroneals and mild discomfort but no significant pain today.  Today she is currently more burning and discomfort to the anterior aspect of the leg  starting proximal to the ankle joint.  There is no pain with calf compression, erythema or warmth.  Calf is supple. No pain with calf compression, swelling, warmth, erythema  Assessment: Osteochondral lesion left ankle, peroneal tendon tear; neuritis  Plan: -All treatment options discussed with the patient including all alternatives, risks, complications.  -We discussed the conservative as well as surgical options.  We discussed surgical invention for osteochondral repair, ankle arthroscopy with peroneal tendon repair.-I am concerned given the bunion to her leg and this is not typical.  Typically compensation for the brace but given her back issues as well motor order nerve conduction test to rule out further pathology that could be contributing to her symptoms.  If this is normal we discussed surgical intervention for the ankle.  Discussed the surgery  was postoperative course today. -We will increase gabapentin to 600 mg 4 times a day.  Trula Slade DPM

## 2022-07-05 ENCOUNTER — Other Ambulatory Visit: Payer: Self-pay

## 2022-07-05 DIAGNOSIS — M792 Neuralgia and neuritis, unspecified: Secondary | ICD-10-CM

## 2022-07-11 DIAGNOSIS — Z23 Encounter for immunization: Secondary | ICD-10-CM | POA: Diagnosis not present

## 2022-07-11 DIAGNOSIS — M25579 Pain in unspecified ankle and joints of unspecified foot: Secondary | ICD-10-CM | POA: Diagnosis not present

## 2022-07-11 DIAGNOSIS — M79671 Pain in right foot: Secondary | ICD-10-CM | POA: Diagnosis not present

## 2022-07-11 DIAGNOSIS — M25559 Pain in unspecified hip: Secondary | ICD-10-CM | POA: Diagnosis not present

## 2022-07-11 DIAGNOSIS — M059 Rheumatoid arthritis with rheumatoid factor, unspecified: Secondary | ICD-10-CM | POA: Diagnosis not present

## 2022-07-11 DIAGNOSIS — M549 Dorsalgia, unspecified: Secondary | ICD-10-CM | POA: Diagnosis not present

## 2022-07-11 DIAGNOSIS — M47819 Spondylosis without myelopathy or radiculopathy, site unspecified: Secondary | ICD-10-CM | POA: Diagnosis not present

## 2022-07-14 ENCOUNTER — Ambulatory Visit: Payer: Medicare HMO | Admitting: Podiatry

## 2022-07-20 ENCOUNTER — Encounter: Payer: Self-pay | Admitting: Gastroenterology

## 2022-07-20 ENCOUNTER — Ambulatory Visit: Payer: Medicare HMO | Admitting: Gastroenterology

## 2022-07-20 VITALS — BP 138/84 | HR 61 | Temp 98.0°F | Ht 63.0 in | Wt 208.0 lb

## 2022-07-20 DIAGNOSIS — K602 Anal fissure, unspecified: Secondary | ICD-10-CM

## 2022-07-20 DIAGNOSIS — K648 Other hemorrhoids: Secondary | ICD-10-CM | POA: Insufficient documentation

## 2022-07-20 NOTE — Patient Instructions (Signed)
I am calling in a cream to [facility] to use four times a day per rectum. Make sure to wear gloves while applying.   I will see you back in 6 weeks!  Continue to avoid straining, limit toilet time to 2-3 minutes.  Please call if no improvement in 1-2 weeks!  I enjoyed seeing you again today! As you know, I value our relationship and want to provide genuine, compassionate, and quality care. I welcome your feedback. If you receive a survey regarding your visit,  I greatly appreciate you taking time to fill this out. See you next time!  Annitta Needs, PhD, ANP-BC [facility]

## 2022-07-20 NOTE — Progress Notes (Signed)
Gastroenterology Office Note     Primary Care Physician:  Glenda Chroman, MD  Primary Gastroenterologist: Dr. Gala Romney    Chief Complaint   Chief Complaint  Patient presents with   Hemorrhoids    Pt here for hemorrhoid pain     History of Present Illness   Marie Farmer is a 66 y.o. female presenting today regarding symptomatic hemorrhoids. She was last seen in Aug 2022 by Dr. Gala Romney for GERD follow-up. Colonoscopy Oct 2022, with surveillance due in 2025.   Notes symptom onset a few months ago. Small amount of blood. Hurts with BM and after BM. Knife-like pain with BMs.  Swollen and angry feeling. Prolapsing tissue but doesn't push back in. Some itching and burning. Some knife-like pain with BM and afterwards as well.   No straining. Since cholecystectomy, looser stool and more urgency. No prolonged toilet time. Using preparation H.     Past Medical History:  Diagnosis Date   Anxiety    Asthma    DDD (degenerative disc disease)    chronic back pain   Fibromyalgia    Foot pain    GERD (gastroesophageal reflux disease)    Hypertension    IBS (irritable bowel syndrome)    Mood disorder (HCC)    PONV (postoperative nausea and vomiting)    Sleep apnea    Venous insufficiency    Wears glasses     Past Surgical History:  Procedure Laterality Date   CARPAL TUNNEL RELEASE Right 06/10/2013   Procedure: CARPAL TUNNEL RELEASE;  Surgeon: Wynonia Sours, MD;  Location: [facility];  Service: Orthopedics;  Laterality: Right;   CHOLECYSTECTOMY N/A 08/19/2021   Procedure: LAPAROSCOPIC CHOLECYSTECTOMY;  Surgeon: Virl Cagey, MD;  [facility];  Service: General;  Laterality: N/A;   COLONOSCOPY  12/20/2011   [name]-single diminutive sessile polyp in the sigmoid colon/small interenal hemorrhoids   COLONOSCOPY N/A 11/28/2017   Procedure: COLONOSCOPY;  Surgeon: Daneil Dolin, MD;  [facility];  Service: Endoscopy;  Laterality: N/A;  10:30    COLONOSCOPY WITH PROPOFOL N/A 07/07/2021   one 9 mm polyp in rectum s/p removal (serrated adenoma). surveillance in 5 years   ESOPHAGOGASTRODUODENOSCOPY  04/10/2003   Normal esophagus/Antral erosions, as described above.  The remainder of the stomach appeared normal.  Normal first and second parts of the duodenum.   ESOPHAGOGASTRODUODENOSCOPY  12/20/2011   [name]-hiatus hernia/chronic gastritis/normal duodenumNEGATIVE H pylori, negative SB biopsy   POLYPECTOMY  07/07/2021   Procedure: POLYPECTOMY;  Surgeon: Daneil Dolin, MD;  [facility];  Service: Endoscopy;;   TIBIA FRACTURE SURGERY  09/11/2010   right    Current Outpatient Medications  Medication Sig Dispense Refill   acetaminophen (TYLENOL) 500 MG tablet Take 1,000 mg by mouth every 6 (six) hours as needed for moderate pain.     albuterol (VENTOLIN HFA) 108 (90 Base) MCG/ACT inhaler Inhale 2 puffs into the lungs every 6 (six) hours as needed for wheezing or shortness of breath.     estradiol (ESTRACE) 0.1 MG/GM vaginal cream Place 1 Applicatorful vaginally daily as needed (Menopause).     FLUoxetine (PROZAC) 20 MG capsule Take 20 mg by mouth daily.     gabapentin (NEURONTIN) 600 MG tablet Take 600 mg by mouth 3 (three) times daily.     levothyroxine (SYNTHROID) 25 MCG tablet Take 50 mcg by mouth daily before breakfast.     methocarbamol (ROBAXIN) 500 MG tablet Take  500 mg by mouth 2 (two) times daily.     methylPREDNISolone (MEDROL DOSEPAK) 4 MG TBPK tablet Take as directed 21 tablet 0   montelukast (SINGULAIR) 10 MG tablet Take 10 mg by mouth daily.     omeprazole (PRILOSEC) 40 MG capsule Daily as needed. 90 capsule 3   sulfaSALAzine (AZULFIDINE) 500 MG tablet Take by mouth.     tiZANidine (ZANAFLEX) 4 MG tablet Take 4 mg by mouth 3 (three) times daily.     valsartan-hydrochlorothiazide (DIOVAN-HCT) 160-25 MG tablet TAKE 1 TABLET BY MOUTH DAILY. 30 tablet 0   Vitamin D, Ergocalciferol, (DRISDOL) 1.25 MG  (50000 UNIT) CAPS capsule Take 50,000 Units by mouth every Sunday.     No current facility-administered medications for this visit.    Allergies as of 07/20/2022 - Review Complete 07/20/2022  Allergen Reaction Noted   Ace inhibitors Shortness Of Breath 12/06/2016   Adhesive [tape]  05/12/2011   Cymbalta [duloxetine hcl]  06/04/2013   Latex  05/12/2011   Methylprednisolone  05/16/2017   Prednisone Other (See Comments) 05/16/2017   Azithromycin Rash 02/29/2012   Nabumetone Rash and Other (See Comments) 08/06/2018    Family History  Problem Relation Age of Onset   Diabetes Mother    Coronary artery disease Mother    COPD Mother    Colon polyps Mother        age 11   Lung cancer Father     Social History   Socioeconomic History   Marital status: Divorced    Spouse name: Not on file   Number of children: 2   Years of education: Not on file   Highest education level: Not on file  Occupational History   Occupation: unemployed  Tobacco Use   Smoking status: Never   Smokeless tobacco: Never  Substance and Sexual Activity   Alcohol use: Yes    Comment: socially, couple times per week   Drug use: No    Types: Marijuana    Comment: remote [place]   Sexual activity: Never    Birth control/protection: None  Other Topics Concern   Not on file  Social History Narrative   Lives w/ sister & her husband & daughter   Social Determinants of Health   Financial Resource Strain: Not on file  Food Insecurity: Not on file  Transportation Needs: Not on file  Physical Activity: Not on file  Stress: Not on file  Social Connections: Not on file  Intimate Partner Violence: Not on file     Review of Systems   Gen: Denies any fever, chills, fatigue, weight loss, lack of appetite.  CV: Denies chest pain, heart palpitations, peripheral edema, syncope.  Resp: Denies shortness of breath at rest or with exertion. Denies wheezing or cough.  GI: Denies dysphagia or odynophagia. Denies  jaundice, hematemesis, fecal incontinence. GU : Denies urinary burning, urinary frequency, urinary hesitancy MS: Denies joint pain, muscle weakness, cramps, or limitation of movement.  Derm: Denies rash, itching, dry skin Psych: Denies depression, anxiety, memory loss, and confusion Heme: Denies bruising, bleeding, and enlarged lymph nodes.   Physical Exam   BP 138/84   Pulse 61   Temp 98 F (36.7 C)   Ht '5\' 3"'$  (1.6 m)   Wt 208 lb (94.3 kg)   LMP 05/12/2007   BMI 36.85 kg/m  General:   Alert and oriented. Pleasant and cooperative. Well-nourished and well-developed.  Head:  Normocephalic and atraumatic. Eyes:  Without icterus Rectal:  small external hemorrhoids/tags. DRE without  mass. Discomfort with DRE. No obvious fissure appreciated. Slight prominence of left lateral column.  Msk:  Symmetrical without gross deformities. Normal posture. Extremities:  Without edema. Neurologic:  Alert and  oriented x4;  grossly normal neurologically. Skin:  Intact without significant lesions or rashes. Psych:  Alert and cooperative. Normal mood and affect.   Assessment   Marie Farmer is a 65 y.o. female presenting today with rectal pain and bleeding.  Symptoms multifactorial with pain emanating from likely anal fissure and other symptoms secondary to internal prolapsing hemorrhoids. We briefly discussed that banding could be an option in the future once the fissure is healed.  For now, we will focus on supportive measures.      PLAN    Compounded cream with Nitro 0.2%, lidocaine 5% called into [facility] to apply QID. Return in 4-6 weeks    Annitta Needs, PhD, [facility] Gastroenterology

## 2022-07-25 DIAGNOSIS — G629 Polyneuropathy, unspecified: Secondary | ICD-10-CM | POA: Diagnosis not present

## 2022-08-02 ENCOUNTER — Telehealth: Payer: Self-pay | Admitting: Podiatry

## 2022-08-02 ENCOUNTER — Other Ambulatory Visit: Payer: Self-pay | Admitting: Podiatry

## 2022-08-02 DIAGNOSIS — M7752 Other enthesopathy of left foot: Secondary | ICD-10-CM

## 2022-08-02 DIAGNOSIS — M899 Disorder of bone, unspecified: Secondary | ICD-10-CM

## 2022-08-02 DIAGNOSIS — G5712 Meralgia paresthetica, left lower limb: Secondary | ICD-10-CM

## 2022-08-02 NOTE — Telephone Encounter (Signed)
I called patient to go over the NCV which shows lateral femoral cutaneous nerve compression. She is also still having quite a bit of problems with the ankle. We had discussed surgery previously and we also discussed it again. She is scared about foot surgery. I offered a second opinion and she would like that. I will put in a referral for Dr. Sharol Given. If she has any other questions or concerns to let me know.

## 2022-08-02 NOTE — Telephone Encounter (Signed)
Left message on voicemail about referral to Dr Sharol Given.

## 2022-08-08 ENCOUNTER — Ambulatory Visit: Payer: Medicare HMO | Admitting: Podiatry

## 2022-08-08 DIAGNOSIS — R52 Pain, unspecified: Secondary | ICD-10-CM | POA: Diagnosis not present

## 2022-08-08 DIAGNOSIS — M109 Gout, unspecified: Secondary | ICD-10-CM | POA: Diagnosis not present

## 2022-08-08 DIAGNOSIS — I10 Essential (primary) hypertension: Secondary | ICD-10-CM | POA: Diagnosis not present

## 2022-08-08 DIAGNOSIS — Z299 Encounter for prophylactic measures, unspecified: Secondary | ICD-10-CM | POA: Diagnosis not present

## 2022-08-08 DIAGNOSIS — M069 Rheumatoid arthritis, unspecified: Secondary | ICD-10-CM | POA: Diagnosis not present

## 2022-08-10 DIAGNOSIS — M5430 Sciatica, unspecified side: Secondary | ICD-10-CM | POA: Diagnosis not present

## 2022-08-10 DIAGNOSIS — I10 Essential (primary) hypertension: Secondary | ICD-10-CM | POA: Diagnosis not present

## 2022-08-14 ENCOUNTER — Ambulatory Visit: Payer: Medicare HMO | Admitting: Orthopedic Surgery

## 2022-08-14 DIAGNOSIS — M25871 Other specified joint disorders, right ankle and foot: Secondary | ICD-10-CM

## 2022-08-14 DIAGNOSIS — M93272 Osteochondritis dissecans, left ankle and joints of left foot: Secondary | ICD-10-CM

## 2022-08-15 ENCOUNTER — Encounter: Payer: Self-pay | Admitting: Orthopedic Surgery

## 2022-08-15 NOTE — Progress Notes (Signed)
Office Visit Note   Patient: Marie Farmer           Date of Birth: 1956/01/07           MRN: [id number] Visit Date: 08/14/2022              Requested by: Trula Slade, DPM [address],  [place] [address] PCP: Glenda Chroman, MD  Chief Complaint  Patient presents with   Right Ankle - Pain   Left Ankle - Pain      HPI: Patient is a 66 year old woman who is seen for initial evaluation for chronic left ankle pain.  Patient has had an MRI scan and radiographs she has been followed by podiatry.  Patient states the pain is worse on the left than the right foot.  Patient did have nerve conduction studies obtained and November which showed impingement of the lateral femoral cutaneous nerve.  Assessment & Plan: Visit Diagnoses:  1. Impingement syndrome of right ankle   2. Osteochondritis dissecans of ankle, left     Plan: Discussed with the patient her primary symptoms seem to be coming from the osteochondral defect of the medial talar dome.  Discussed that we could proceed with arthroscopic debridement of the osteochondral lesion as well as debridement of synovial impingement.  Discussed that this has approximately 50% chance to make her ankle symptoms better.  Will proceed with surgery at patient's convenience.  Follow-Up Instructions: Return in about 2 weeks (around 08/28/2022).   Ortho Exam  Patient is alert, oriented, no adenopathy, well-dressed, normal affect, normal respiratory effort. Examination patient has a good dorsalis pedis pulse she points to the anterior medial joint line of her area of most symptoms.  She is tender to palpation of the anterior medial joint line MRI scan does show an osteochondral defect of the medial talar dome.  She also has symptoms over the posterior tibial tendon but she can do a heel raise symmetrically.  There is no pain to palpation of the peroneal tendon she has good dorsiflexion of the ankle.  Radiograph shows a long  second metatarsal status post bunion surgery of the first metatarsal.  Imaging: No results found. No images are attached to the encounter.  Labs: Lab Results  Component Value Date   ESRSEDRATE 22 04/18/2018   CRP 4.6 04/18/2018     No results found for: "ALBUMIN", "PREALBUMIN", "CBC"  No results found for: "MG" No results found for: "VD25OH"  No results found for: "PREALBUMIN"    Latest Ref Rng & Units 10/16/2016    2:24 PM 06/10/2013    7:57 AM  CBC EXTENDED  WBC 4.0 - 10.5 K/uL 5.7    RBC 3.87 - 5.11 Mil/uL 4.43    Hemoglobin 12.0 - 15.0 g/dL 12.7  12.5   HCT 36.0 - 46.0 % 37.8    Platelets 150.0 - 400.0 K/uL 298.0    NEUT# 1.4 - 7.7 K/uL 3.6    Lymph# 0.7 - 4.0 K/uL 1.5       There is no height or weight on file to calculate BMI.  Orders:  No orders of the defined types were placed in this encounter.  No orders of the defined types were placed in this encounter.    Procedures: No procedures performed  Clinical Data: No additional findings.  ROS:  All other systems negative, except as noted in the HPI. Review of Systems  Objective: Vital Signs: LMP 05/12/2007   Specialty Comments:  EXAM: MRI LUMBAR SPINE WITHOUT CONTRAST  TECHNIQUE: Multiplanar, multisequence MR imaging of the lumbar spine was performed. No intravenous contrast was administered.  COMPARISON: 02/01/2018  FINDINGS: Segmentation: Standard.  Alignment: Mild scoliosis  Vertebrae: No fracture, evidence of discitis, or aggressive bone lesion.  Conus medullaris and cauda equina: Conus extends to the L2 level. Conus and cauda equina appear normal.  Paraspinal and other soft tissues: Negative for perispinal mass or inflammation  Disc levels:  T12- L1: Unremarkable.  L1-L2: Mild disc space narrowing and bulging  L2-L3: Unremarkable.  L3-L4: Mild disc space narrowing and left eccentric bulging with small annular fissure.  L4-L5: Disc space narrowing and left preferential  bulging with inferior foraminal protrusion. No neural compression  L5-S1:Degenerative facet spurring asymmetric to the right. Disc narrowing and mild bulging.  IMPRESSION: Stable lumbar spine degeneration when compared to 2019. No neural impingement or visible inflammation.   Electronically Signed By: Jorje Guild M.D. 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   Other specified nontoxic goiter 12/12/2021   Anemia of chronic disease 08/02/2021   Ankle impingement syndrome, left 03/25/2020   Osteochondritis dissecans of talus, left 03/25/2020   Instability of left ankle joint 02/03/2020   Plantar fasciitis 04/18/2018   Aphthous ulcer of mouth 04/02/2018   Difficulty sleeping 04/02/2018   Under emotional stress 04/02/2018   OSA on CPAP 02/01/2018   Dyspnea on exertion 10/16/2016   Essential hypertension 10/16/2016   Morbid obesity due to excess calories ([place]) 10/16/2016   IBS (irritable bowel syndrome) 02/29/2012   Tibial plateau fracture 07/05/8526   HELICOBACTER PYLORI INFECTION 08/25/2009   DEPRESSION 08/25/2009   ASTHMA 08/25/2009   GASTROESOPHAGEAL REFLUX DISEASE, CHRONIC 08/25/2009   ALLERGY 08/25/2009   Cholelithiasis 08/25/2009   Past Medical History:  Diagnosis Date   Anxiety    Asthma    DDD (degenerative disc disease)    chronic back pain   Fibromyalgia    Foot pain    GERD (gastroesophageal reflux disease)    Hypertension    IBS (irritable bowel syndrome)    Mood disorder (HCC)    PONV (postoperative nausea and vomiting)    Sleep apnea    Venous insufficiency    Wears glasses     Family History  Problem Relation Age of Onset   Diabetes Mother    Coronary artery disease  Mother    COPD Mother    Colon polyps Mother        age 64   Lung cancer Father     Past Surgical History:  Procedure Laterality Date   CARPAL TUNNEL RELEASE Right 06/10/2013   Procedure: CARPAL TUNNEL RELEASE;  Surgeon: Wynonia Sours, MD;  Location: [facility];  Service: Orthopedics;  Laterality: Right;   CHOLECYSTECTOMY N/A 08/19/2021   Procedure: LAPAROSCOPIC CHOLECYSTECTOMY;  Surgeon: Virl Cagey, MD;  [facility];  Service: General;  Laterality: N/A;   COLONOSCOPY  12/20/2011   [name]-single diminutive sessile polyp in the sigmoid colon/small interenal hemorrhoids   COLONOSCOPY N/A 11/28/2017   Procedure: COLONOSCOPY;  Surgeon: Daneil Dolin, MD;  [facility];  Service: Endoscopy;  Laterality: N/A;  10:30   COLONOSCOPY WITH PROPOFOL N/A 07/07/2021   one 9 mm polyp  in rectum s/p removal (serrated adenoma). surveillance in 5 years   ESOPHAGOGASTRODUODENOSCOPY  04/10/2003   Normal esophagus/Antral erosions, as described above.  The remainder of the stomach appeared normal.  Normal first and second parts of the duodenum.   ESOPHAGOGASTRODUODENOSCOPY  12/20/2011   [name]-hiatus hernia/chronic gastritis/normal duodenumNEGATIVE H pylori, negative SB biopsy   POLYPECTOMY  07/07/2021   Procedure: POLYPECTOMY;  Surgeon: Daneil Dolin, MD;  [facility];  Service: Endoscopy;;   TIBIA FRACTURE SURGERY  09/11/2010   right   Social History   Occupational History   Occupation: unemployed  Tobacco Use   Smoking status: Never   Smokeless tobacco: Never  Substance and Sexual Activity   Alcohol use: Yes    Comment: socially, couple times per week   Drug use: No    Types: Marijuana    Comment: remote [place]   Sexual activity: Never    Birth control/protection: None

## 2022-08-28 ENCOUNTER — Telehealth: Payer: Self-pay | Admitting: Orthopedic Surgery

## 2022-08-28 NOTE — Telephone Encounter (Signed)
Patient states she has phone problems and wants Malachy Mood to text and advise that she is call ing prior to the call about surgery..[phone]

## 2022-08-30 ENCOUNTER — Telehealth: Payer: Self-pay | Admitting: Orthopedic Surgery

## 2022-08-30 NOTE — Telephone Encounter (Signed)
I attempted to return Marie Farmer's call.  There was no answer and I left another message on her voicemail.

## 2022-08-31 ENCOUNTER — Ambulatory Visit: Payer: Medicare HMO | Admitting: Gastroenterology

## 2022-09-13 ENCOUNTER — Ambulatory Visit (INDEPENDENT_AMBULATORY_CARE_PROVIDER_SITE_OTHER): Payer: PPO | Admitting: Podiatry

## 2022-09-13 DIAGNOSIS — M899 Disorder of bone, unspecified: Secondary | ICD-10-CM

## 2022-09-13 DIAGNOSIS — M7752 Other enthesopathy of left foot: Secondary | ICD-10-CM

## 2022-09-13 DIAGNOSIS — M949 Disorder of cartilage, unspecified: Secondary | ICD-10-CM

## 2022-09-13 NOTE — Patient Instructions (Signed)
Pre-Operative Instructions  Congratulations, you have decided to take an important step to improving your quality of life.  You can be assured that the doctors of [facility] will be with you every step of the way.  Plan to be at the surgery center/hospital at least 1 (one) hour prior to your scheduled time unless otherwise directed by the surgical center/hospital staff.  You must have a responsible adult accompany you, remain during the surgery and drive you home.  Make sure you have directions to the surgical center/hospital and know how to get there on time. For hospital based surgery you will need to obtain a history and physical form from your family physician within 1 month prior to the date of surgery- we will give you a form for you primary physician.  We make every effort to accommodate the date you request for surgery.  There are however, times where surgery dates or times have to be moved.  We will contact you as soon as possible if a change in schedule is required.   No Aspirin/Ibuprofen for one week before surgery.  If you are on aspirin, any non-steroidal anti-inflammatory medications (Mobic, Aleve, Ibuprofen) you should stop taking it 7 days prior to your surgery.  You make take Tylenol  For pain prior to surgery.  Medications- If you are taking daily heart and blood pressure medications, seizure, reflux, allergy, asthma, anxiety, pain or diabetes medications, make sure the surgery center/hospital is aware before the day of surgery so they may notify you which medications to take or avoid the day of surgery. No food or drink after midnight the night before surgery unless directed otherwise by surgical center/hospital staff. No alcoholic beverages 24 hours prior to surgery.  No smoking 24 hours prior to or 24 hours after surgery. Wear loose pants or shorts- loose enough to fit over bandages, boots, and casts. No slip on shoes, sneakers are best. Bring your boot with you to the  surgery center/hospital.  Also bring crutches or a walker if your physician has prescribed it for you.  If you do not have this equipment, it will be provided for you after surgery. If you have not been contracted by the surgery center/hospital by the day before your surgery, call to confirm the date and time of your surgery. Leave-time from work may vary depending on the type of surgery you have.  Appropriate arrangements should be made prior to surgery with your employer. Prescriptions will be provided immediately following surgery by your doctor.  Have these filled as soon as possible after surgery and take the medication as directed. Remove nail polish on the operative foot. Wash the night before surgery.  The night before surgery wash the foot and leg well with the antibacterial soap provided and water paying special attention to beneath the toenails and in between the toes.  Rinse thoroughly with water and dry well with a towel.  Perform this wash unless told not to do so by your physician.  Enclosed: 1 Ice pack (please put in freezer the night before surgery)   1 Hibiclens skin cleaner   Pre-op Instructions  If you have any questions regarding the instructions, do not hesitate to call our office at any point during this process.   [place]: [address] [place], [place] [address] [phone]  [place]: [address] [place], [place] [address] [phone]  Dr. Rickia [place], DPM  

## 2022-09-13 NOTE — Progress Notes (Signed)
Subjective: No chief complaint on file.    67 year old female presents for follow-up evaluation of continued pain along her left ankle.  She wants to proceed with having surgery.  She did get a second opinion as well.  She presents today for scheduling of surgery.  She is continue to get discomfort.  He has not been hurting as much as she has been trying to limit that her to be given the pain.  Objective: AAO x3, NAD DP/PT pulses palpable bilaterally, CRT less than 3 seconds There is tenderness palpation on the anterior medial aspect of the ankle joint on the left side.  Occasionally she gets discomfort lateral aspect the ankle but that is not her main concern.  There is no crepitation or restriction with ankle joint range of motion.  Flexor, extensor tendons appear to be intact.  MMT 5/5. No pain with calf compression, swelling, warmth, erythema  Assessment: Osteochondral lesion left ankle, peroneal tendon tear; neuritis  Plan: -All treatment options discussed with the patient including all alternatives, risks, complications -I again reviewed the MRI with her.  Discussed with conservative as well as surgical treatment options.  I discussed with her ankle arthroscopy, microfracture, debridement of the ankle joint.  She was to proceed with this.  I discussed with her possibly doing the peroneal tendon was not causing significant pain and she does not want proceed with that part of the procedure and she wants to do 1 thing at a time.  Will plan on left ankle arthroscopy, microfracture and debridement.  I discussed with her the success rates of this and this is not a guarantee is complete her symptoms a small chance it could make the symptoms worse.  Also discussed high likelihood of possible need for further surgery in the future.  She is well aware of this. -The incision placement as well as the postoperative course was discussed with the patient. I discussed risks of the surgery which include, but  not limited to, infection, bleeding, pain, swelling, need for further surgery, delayed or nonhealing, painful or ugly scar, numbness or sensation changes, over/under correction, recurrence, transfer lesions, further deformity,  DVT/PE, loss of toe/foot. Patient understands these risks and wishes to proceed with surgery. The surgical consent was reviewed with the patient all 3 pages were signed. No promises or guarantees were given to the outcome of the procedure. All questions were answered to the best of my ability. Before the surgery the patient was encouraged to call the office if there is any further questions. The surgery will be performed at the [facility] on an outpatient basis.  Trula Slade DPM

## 2022-09-19 ENCOUNTER — Telehealth: Payer: Self-pay | Admitting: Podiatry

## 2022-09-19 DIAGNOSIS — Z299 Encounter for prophylactic measures, unspecified: Secondary | ICD-10-CM | POA: Diagnosis not present

## 2022-09-19 DIAGNOSIS — M7521 Bicipital tendinitis, right shoulder: Secondary | ICD-10-CM | POA: Diagnosis not present

## 2022-09-19 DIAGNOSIS — M069 Rheumatoid arthritis, unspecified: Secondary | ICD-10-CM | POA: Diagnosis not present

## 2022-09-19 DIAGNOSIS — F339 Major depressive disorder, recurrent, unspecified: Secondary | ICD-10-CM | POA: Diagnosis not present

## 2022-09-19 DIAGNOSIS — I10 Essential (primary) hypertension: Secondary | ICD-10-CM | POA: Diagnosis not present

## 2022-09-19 NOTE — Telephone Encounter (Signed)
DOS: 10/11/2022  Dr. Loel Lofty or Dr. Sherryle Lis Assisting  [employer] Effective 09/11/2022  Ankle Arthroscopy w/ Microfracture Lt [phone])  Authorization #: Authorization Valid:

## 2022-09-27 DIAGNOSIS — L570 Actinic keratosis: Secondary | ICD-10-CM | POA: Diagnosis not present

## 2022-10-11 ENCOUNTER — Other Ambulatory Visit: Payer: Self-pay | Admitting: Podiatry

## 2022-10-11 ENCOUNTER — Telehealth: Payer: Self-pay | Admitting: *Deleted

## 2022-10-11 ENCOUNTER — Encounter: Payer: Self-pay | Admitting: Podiatry

## 2022-10-11 DIAGNOSIS — M65872 Other synovitis and tenosynovitis, left ankle and foot: Secondary | ICD-10-CM | POA: Diagnosis not present

## 2022-10-11 DIAGNOSIS — M7752 Other enthesopathy of left foot: Secondary | ICD-10-CM | POA: Diagnosis not present

## 2022-10-11 DIAGNOSIS — G8918 Other acute postprocedural pain: Secondary | ICD-10-CM | POA: Diagnosis not present

## 2022-10-11 DIAGNOSIS — M93272 Osteochondritis dissecans, left ankle and joints of left foot: Secondary | ICD-10-CM | POA: Diagnosis not present

## 2022-10-11 MED ORDER — CEPHALEXIN 500 MG PO CAPS: 500.0000 mg | ORAL_CAPSULE | Freq: Three times a day (TID) | ORAL | 0 refills | Status: DC

## 2022-10-11 MED ORDER — PROMETHAZINE HCL 25 MG PO TABS: 25.0000 mg | ORAL_TABLET | Freq: Three times a day (TID) | ORAL | 0 refills | Status: DC | PRN

## 2022-10-11 MED ORDER — OXYCODONE-ACETAMINOPHEN 5-325 MG PO TABS: 1.0000 | ORAL_TABLET | Freq: Four times a day (QID) | ORAL | 0 refills | Status: DC | PRN

## 2022-10-11 NOTE — Telephone Encounter (Signed)
Patient is calling to have her pain medicine resent to pharmacy([facility]) in [place], was sent to wrong one, please advise.

## 2022-10-11 NOTE — Progress Notes (Signed)
Resent medication to updated pharmacy.

## 2022-10-11 NOTE — Progress Notes (Signed)
Postop medications sent 

## 2022-10-11 NOTE — Telephone Encounter (Signed)
Pt called back about her medication it was sent to [facility] in [place] but they are telling pt that they cannot fill the pain medication because it was sent to [facility] and they filled it.  I discussed with the triage nurse Ammie and she is to call [facility] and cancel the order so that the [facility] can fill it.

## 2022-10-11 NOTE — Telephone Encounter (Signed)
Called and cancelled prescription sent to [facility] for the oxycodone-ace.

## 2022-10-16 ENCOUNTER — Ambulatory Visit (INDEPENDENT_AMBULATORY_CARE_PROVIDER_SITE_OTHER): Payer: PPO | Admitting: *Deleted

## 2022-10-16 ENCOUNTER — Ambulatory Visit (INDEPENDENT_AMBULATORY_CARE_PROVIDER_SITE_OTHER): Payer: HMO

## 2022-10-16 DIAGNOSIS — M7752 Other enthesopathy of left foot: Secondary | ICD-10-CM

## 2022-10-16 DIAGNOSIS — Z9889 Other specified postprocedural states: Secondary | ICD-10-CM

## 2022-10-16 NOTE — Progress Notes (Signed)
Patient presents today for post op visit # 1 , patient of Dr. Jacqualyn Posey   POV #1 DOS 10/11/2022 LT ANKLE ARTHSCOPY W/MICROFRACTURE & CLEANING OF THE ANKLE JOINT    Patient presents in non-weight bearing in a wheelchair. Denies any falls or injury to the foot. Foot is slightly swollen. No signs of infection. No calf pain or shortness of breath. Bandages dry and intact. Incision is intact. She has completed post op antibiotics and taking pain medication as needed.  She states she has felt really good. There hasn't bee a lot of pain at all.   BP: 146/75 P: 70     Xrays taken today and reviewed by Dr. Jacqualyn Posey. He did take a look at the foot today as well.   Foot redressed today and placed back in the boot. Reviewed icing and elevation. Patient will follow up with Dr. Jacqualyn Posey for POV# 2 next week for suture removal.

## 2022-10-17 DIAGNOSIS — Z4789 Encounter for other orthopedic aftercare: Secondary | ICD-10-CM | POA: Diagnosis not present

## 2022-10-17 DIAGNOSIS — Z981 Arthrodesis status: Secondary | ICD-10-CM | POA: Diagnosis not present

## 2022-10-26 ENCOUNTER — Ambulatory Visit (INDEPENDENT_AMBULATORY_CARE_PROVIDER_SITE_OTHER): Payer: HMO | Admitting: Podiatry

## 2022-10-26 ENCOUNTER — Telehealth: Payer: Self-pay | Admitting: *Deleted

## 2022-10-26 DIAGNOSIS — M7752 Other enthesopathy of left foot: Secondary | ICD-10-CM

## 2022-10-26 DIAGNOSIS — M949 Disorder of cartilage, unspecified: Secondary | ICD-10-CM

## 2022-10-26 DIAGNOSIS — M899 Disorder of bone, unspecified: Secondary | ICD-10-CM

## 2022-10-26 NOTE — Telephone Encounter (Signed)
Called patient giving instructions per physician thru voice message.

## 2022-10-26 NOTE — Telephone Encounter (Signed)
Patient is calling because she forgot to ask during visit if she is supposed to use the crutches just to go to bathroom or more than before?

## 2022-10-26 NOTE — Progress Notes (Signed)
Subjective: Chief Complaint  Patient presents with   Post-op Follow-up    POV #2 DOS 10/11/2022 LT ANKLE ARTHSCOPY W/MICROFRACTURE & CLEANING OF THE ANKLE JOINT    NICOLETT VORHIES is a 67 y.o. is seen today in office s/p the above procedures. She is not on any pain medication. Denies any systemic complaints such as fevers, chills, nausea, vomiting. No calf pain, chest pain, shortness of breath.   Objective: General: No acute distress, AAOx3  DP/PT pulses palpable 2/4, CRT < 3 sec to all digits.  Protective sensation intact. Motor function intact.  Left  foot: Incision is well coapted without any evidence of dehiscence. There is no surrounding erythema, ascending cellulitis, fluctuance, crepitus, malodor, drainage/purulence. There is minimal edema around the surgical site. There is no significant pain along the surgical site.  No other areas of tenderness to bilateral lower extremities.  No other open lesions or pre-ulcerative lesions.  No pain with calf compression, swelling, warmth, erythema.   Assessment and Plan:  Status post left ankle surgery, doing well with no complications   -Treatment options discussed including all alternatives, risks, and complications -Sutures removed today without complications. Antibiotic ointment and bandage applied followed by a DSD. She can start to wash with soap and water, dry well and apply a similar bandage.  -Ice/elevation -Pain medication as needed. -Monitor for any clinical signs or symptoms of infection and DVT/PE and directed to call the office immediately should any occur or go to the ER. -Follow-up as scheduled or sooner if any problems arise. In the meantime, encouraged to call the office with any questions, concerns, change in symptoms.   Return in about 2 weeks (around 11/09/2022).  Celesta Gentile, DPM

## 2022-11-03 ENCOUNTER — Telehealth: Payer: Self-pay | Admitting: Physical Medicine and Rehabilitation

## 2022-11-03 NOTE — Telephone Encounter (Signed)
Patient states she has been waiting on a call back for two days.Marie KitchenMarland Farmer

## 2022-11-03 NOTE — Telephone Encounter (Signed)
Spoke with patient and she stated she had a nerve sensitivity test and they found something in the upper leg. She wants to know if Dr. Ernestina Patches can take a look at it and tell her what it means and if it is coming from her back. Dr. Celesta Gentile performed the test. Please advie

## 2022-11-06 ENCOUNTER — Telehealth: Payer: Self-pay | Admitting: Physical Medicine and Rehabilitation

## 2022-11-06 NOTE — Telephone Encounter (Signed)
Pt called to make appt for left leg pains. Pt phone number is [phone].

## 2022-11-06 NOTE — Telephone Encounter (Signed)
See previous encounter

## 2022-11-06 NOTE — Telephone Encounter (Signed)
LVM to return call to clinic.

## 2022-11-07 NOTE — Telephone Encounter (Signed)
Spoke with patient and informed her of the information below. Advised that if she is having leg pain to make an appointment with Dr. Sharol Given per Dr. Ernestina Patches. Verbalized understanding

## 2022-11-08 DIAGNOSIS — M25511 Pain in right shoulder: Secondary | ICD-10-CM | POA: Diagnosis not present

## 2022-11-08 DIAGNOSIS — I10 Essential (primary) hypertension: Secondary | ICD-10-CM | POA: Diagnosis not present

## 2022-11-08 DIAGNOSIS — Z299 Encounter for prophylactic measures, unspecified: Secondary | ICD-10-CM | POA: Diagnosis not present

## 2022-11-08 DIAGNOSIS — Z6837 Body mass index (BMI) 37.0-37.9, adult: Secondary | ICD-10-CM | POA: Diagnosis not present

## 2022-11-08 DIAGNOSIS — M25552 Pain in left hip: Secondary | ICD-10-CM | POA: Diagnosis not present

## 2022-11-09 ENCOUNTER — Ambulatory Visit (INDEPENDENT_AMBULATORY_CARE_PROVIDER_SITE_OTHER): Payer: HMO

## 2022-11-09 ENCOUNTER — Ambulatory Visit (INDEPENDENT_AMBULATORY_CARE_PROVIDER_SITE_OTHER): Payer: HMO | Admitting: Podiatry

## 2022-11-09 DIAGNOSIS — M7752 Other enthesopathy of left foot: Secondary | ICD-10-CM

## 2022-11-09 DIAGNOSIS — M899 Disorder of bone, unspecified: Secondary | ICD-10-CM

## 2022-11-09 DIAGNOSIS — M79672 Pain in left foot: Secondary | ICD-10-CM

## 2022-11-09 DIAGNOSIS — Z9889 Other specified postprocedural states: Secondary | ICD-10-CM

## 2022-11-09 DIAGNOSIS — M949 Disorder of cartilage, unspecified: Secondary | ICD-10-CM

## 2022-11-09 NOTE — Progress Notes (Signed)
Subjective: Chief Complaint  Patient presents with   Routine Post Op    POV #3 DOS 10/11/2022 LT ANKLE ARTHSCOPY W/MICROFRACTURE & CLEANING OF THE ANKLE JOINT    Marie Farmer is a 67 y.o. is seen today in office s/p the above procedures.  States that she is doing well and she feels that she can start walking on it more.  She denies any significant fever or chills.  No other concerns.  Objective: General: No acute distress, AAOx3  DP/PT pulses palpable 2/4, CRT < 3 sec to all digits.  Protective sensation intact. Motor function intact.  Left  foot: Incision is well coapted without any evidence of dehiscence.  Scars are forming.  There is trace edema.  There is no erythema or warmth.  There is no significant tenderness palpation of the ankle and surgical sites.  Ankle range of motion intact but any restrictions or crepitation. No pain with calf compression, swelling, warmth, erythema.   Assessment and Plan:  Status post left ankle surgery, doing well with no complications   -Treatment options discussed including all alternatives, risks, and complications -X-rays were obtained reviewed of the ankle.  There is no evidence of acute fracture. -We discussed transition to weightbearing in the cam boot as tolerated.  Will also start physical therapy and referral was placed for this today.  Continue ice, elevate as well as compression to help with the residual edema. -Monitor for any clinical signs or symptoms of infection and directed to call the office immediately should any occur or go to the ER.  Return in about 2 weeks (around 11/23/2022) for post-op visit.  Trula Slade DPM

## 2022-11-14 ENCOUNTER — Telehealth: Payer: Self-pay | Admitting: *Deleted

## 2022-11-14 NOTE — Telephone Encounter (Signed)
[phone]) referral for PT to [facility], attn: Logan per their request , had not received as of yet,confirmation received 11/14/22.

## 2022-11-15 ENCOUNTER — Telehealth: Payer: Self-pay | Admitting: Podiatry

## 2022-11-15 NOTE — Telephone Encounter (Signed)
Logan with [facility] called see what the protocol is with this order for PT and what range of motion they need to work on.  Direct number [phone] Option 2 or fax [phone]   Please advise

## 2022-11-17 DIAGNOSIS — Z5333 Arthroscopic surgical procedure converted to open procedure: Secondary | ICD-10-CM | POA: Diagnosis not present

## 2022-11-17 DIAGNOSIS — R262 Difficulty in walking, not elsewhere classified: Secondary | ICD-10-CM | POA: Diagnosis not present

## 2022-11-17 DIAGNOSIS — M6281 Muscle weakness (generalized): Secondary | ICD-10-CM | POA: Diagnosis not present

## 2022-11-17 DIAGNOSIS — M25572 Pain in left ankle and joints of left foot: Secondary | ICD-10-CM | POA: Diagnosis not present

## 2022-11-20 ENCOUNTER — Telehealth: Payer: Self-pay | Admitting: Podiatry

## 2022-11-20 NOTE — Telephone Encounter (Signed)
ERROR

## 2022-11-20 NOTE — Telephone Encounter (Signed)
Attempted to contact the patient, no answer, left a voicemail for the patient to call office back.

## 2022-11-20 NOTE — Telephone Encounter (Signed)
Noted, thanks!

## 2022-11-20 NOTE — Telephone Encounter (Signed)
Patient called back I did advise of note from Dr. Jacqualyn Posey. Patient understood

## 2022-11-21 DIAGNOSIS — Z5333 Arthroscopic surgical procedure converted to open procedure: Secondary | ICD-10-CM | POA: Diagnosis not present

## 2022-11-21 DIAGNOSIS — R262 Difficulty in walking, not elsewhere classified: Secondary | ICD-10-CM | POA: Diagnosis not present

## 2022-11-21 DIAGNOSIS — M6281 Muscle weakness (generalized): Secondary | ICD-10-CM | POA: Diagnosis not present

## 2022-11-21 DIAGNOSIS — M25572 Pain in left ankle and joints of left foot: Secondary | ICD-10-CM | POA: Diagnosis not present

## 2022-11-23 DIAGNOSIS — M25572 Pain in left ankle and joints of left foot: Secondary | ICD-10-CM | POA: Diagnosis not present

## 2022-11-23 DIAGNOSIS — Z5333 Arthroscopic surgical procedure converted to open procedure: Secondary | ICD-10-CM | POA: Diagnosis not present

## 2022-11-23 DIAGNOSIS — R262 Difficulty in walking, not elsewhere classified: Secondary | ICD-10-CM | POA: Diagnosis not present

## 2022-11-23 DIAGNOSIS — M6281 Muscle weakness (generalized): Secondary | ICD-10-CM | POA: Diagnosis not present

## 2022-11-24 ENCOUNTER — Ambulatory Visit (INDEPENDENT_AMBULATORY_CARE_PROVIDER_SITE_OTHER): Payer: HMO

## 2022-11-24 ENCOUNTER — Ambulatory Visit (INDEPENDENT_AMBULATORY_CARE_PROVIDER_SITE_OTHER): Payer: HMO | Admitting: Podiatry

## 2022-11-24 DIAGNOSIS — M949 Disorder of cartilage, unspecified: Secondary | ICD-10-CM

## 2022-11-24 DIAGNOSIS — M899 Disorder of bone, unspecified: Secondary | ICD-10-CM

## 2022-11-24 DIAGNOSIS — Z9889 Other specified postprocedural states: Secondary | ICD-10-CM

## 2022-11-28 DIAGNOSIS — Z5333 Arthroscopic surgical procedure converted to open procedure: Secondary | ICD-10-CM | POA: Diagnosis not present

## 2022-11-28 DIAGNOSIS — R262 Difficulty in walking, not elsewhere classified: Secondary | ICD-10-CM | POA: Diagnosis not present

## 2022-11-28 DIAGNOSIS — M25572 Pain in left ankle and joints of left foot: Secondary | ICD-10-CM | POA: Diagnosis not present

## 2022-11-28 DIAGNOSIS — M6281 Muscle weakness (generalized): Secondary | ICD-10-CM | POA: Diagnosis not present

## 2022-11-30 DIAGNOSIS — Z5333 Arthroscopic surgical procedure converted to open procedure: Secondary | ICD-10-CM | POA: Diagnosis not present

## 2022-11-30 DIAGNOSIS — R262 Difficulty in walking, not elsewhere classified: Secondary | ICD-10-CM | POA: Diagnosis not present

## 2022-11-30 DIAGNOSIS — M25572 Pain in left ankle and joints of left foot: Secondary | ICD-10-CM | POA: Diagnosis not present

## 2022-11-30 DIAGNOSIS — M6281 Muscle weakness (generalized): Secondary | ICD-10-CM | POA: Diagnosis not present

## 2022-12-03 NOTE — Progress Notes (Signed)
Subjective: Chief Complaint  Patient presents with   Follow-up    POV #4 DOS 10/11/2022 LT ANKLE ARTHSCOPY W/MICROFRACTURE & CLEANING OF THE ANKLE JOINT, PATIENT STATED SHE DOING WHAT SHE EXPECTED WITH THE ANKLE, PAIN HAS NOT BECOME WORSE SINCE SURGERY BUT DOES STILL EXIST     Marie Farmer is a 67 y.o. is seen today in office s/p the above procedures.  She states overall she is doing better and she feels her pain is improving.  She has been doing physical therapy as well.  She started to walk in the cam boot some.  No fevers or chills.  Objective: General: No acute distress, AAOx3  DP/PT pulses palpable 2/4, CRT < 3 sec to all digits.  Protective sensation intact. Motor function intact.  Left  foot: Incision is well coapted without any evidence of dehiscence.  Scars have formed.  There is still some slight edema present there is no erythema or warmth.  No pain or crepitation with any recurrent range of motion.  The tenderness that she is experiencing prior to surgery is significantly improved.  No apparent areas of pinpoint tenderness noted today. No pain with calf compression, swelling, warmth, erythema.   Assessment and Plan:  Status post left ankle surgery, doing well with no complications   -Treatment options discussed including all alternatives, risks, and complications -X-rays were obtained reviewed of the ankle.  3 views of the ankle were obtained.  There is no evidence of acute fracture. -Plan continue physical therapy.  She can transition to weight-bear as tolerated in the cam boot and as she starts to progress with physical therapy she can gradually transition back to regular shoe as tolerated this well.  Continue ice and elevate as well as compression of any residual edema.  Return in about 4 weeks (around 12/22/2022).  Trula Slade DPM

## 2022-12-05 DIAGNOSIS — M6281 Muscle weakness (generalized): Secondary | ICD-10-CM | POA: Diagnosis not present

## 2022-12-05 DIAGNOSIS — Z5333 Arthroscopic surgical procedure converted to open procedure: Secondary | ICD-10-CM | POA: Diagnosis not present

## 2022-12-05 DIAGNOSIS — R262 Difficulty in walking, not elsewhere classified: Secondary | ICD-10-CM | POA: Diagnosis not present

## 2022-12-05 DIAGNOSIS — M25572 Pain in left ankle and joints of left foot: Secondary | ICD-10-CM | POA: Diagnosis not present

## 2022-12-07 DIAGNOSIS — M25572 Pain in left ankle and joints of left foot: Secondary | ICD-10-CM | POA: Diagnosis not present

## 2022-12-07 DIAGNOSIS — Z5333 Arthroscopic surgical procedure converted to open procedure: Secondary | ICD-10-CM | POA: Diagnosis not present

## 2022-12-07 DIAGNOSIS — R262 Difficulty in walking, not elsewhere classified: Secondary | ICD-10-CM | POA: Diagnosis not present

## 2022-12-07 DIAGNOSIS — M6281 Muscle weakness (generalized): Secondary | ICD-10-CM | POA: Diagnosis not present

## 2022-12-11 ENCOUNTER — Other Ambulatory Visit (HOSPITAL_COMMUNITY): Payer: Self-pay

## 2022-12-11 DIAGNOSIS — K219 Gastro-esophageal reflux disease without esophagitis: Secondary | ICD-10-CM | POA: Diagnosis not present

## 2022-12-11 DIAGNOSIS — Z6837 Body mass index (BMI) 37.0-37.9, adult: Secondary | ICD-10-CM | POA: Diagnosis not present

## 2022-12-11 DIAGNOSIS — E039 Hypothyroidism, unspecified: Secondary | ICD-10-CM | POA: Diagnosis not present

## 2022-12-11 DIAGNOSIS — I10 Essential (primary) hypertension: Secondary | ICD-10-CM | POA: Diagnosis not present

## 2022-12-11 DIAGNOSIS — Z299 Encounter for prophylactic measures, unspecified: Secondary | ICD-10-CM | POA: Diagnosis not present

## 2022-12-11 DIAGNOSIS — M069 Rheumatoid arthritis, unspecified: Secondary | ICD-10-CM | POA: Diagnosis not present

## 2022-12-11 MED ORDER — OMEPRAZOLE 20 MG PO CPDR
20.0000 mg | DELAYED_RELEASE_CAPSULE | Freq: Two times a day (BID) | ORAL | 4 refills | Status: DC
  Filled 2022-12-11 – 2022-12-13 (×3): qty 180, 90d supply, fill #0
  Filled 2023-04-03: qty 180, 90d supply, fill #1

## 2022-12-11 MED ORDER — VALSARTAN-HYDROCHLOROTHIAZIDE 160-25 MG PO TABS
1.0000 | ORAL_TABLET | Freq: Every day | ORAL | 4 refills | Status: DC
  Filled 2022-12-11 – 2022-12-13 (×2): qty 90, 90d supply, fill #0
  Filled 2023-04-03: qty 90, 90d supply, fill #1

## 2022-12-11 MED ORDER — METHOCARBAMOL 500 MG PO TABS
500.0000 mg | ORAL_TABLET | Freq: Two times a day (BID) | ORAL | 0 refills | Status: DC
  Filled 2022-12-11 – 2023-06-14 (×3): qty 180, 90d supply, fill #0

## 2022-12-11 MED ORDER — TIZANIDINE HCL 4 MG PO TABS
4.0000 mg | ORAL_TABLET | Freq: Three times a day (TID) | ORAL | 0 refills | Status: DC | PRN
  Filled 2022-12-11 – 2022-12-13 (×3): qty 270, 90d supply, fill #0

## 2022-12-11 MED ORDER — LEVOTHYROXINE SODIUM 50 MCG PO TABS
50.0000 ug | ORAL_TABLET | Freq: Every day | ORAL | 3 refills | Status: DC
  Filled 2022-12-11 – 2022-12-13 (×3): qty 90, 90d supply, fill #0

## 2022-12-11 MED ORDER — FEXOFENADINE HCL 180 MG PO TABS
180.0000 mg | ORAL_TABLET | Freq: Every day | ORAL | 3 refills | Status: DC
  Filled 2022-12-13: qty 90, 90d supply, fill #0

## 2022-12-11 MED ORDER — GABAPENTIN 600 MG PO TABS
600.0000 mg | ORAL_TABLET | Freq: Three times a day (TID) | ORAL | 2 refills | Status: DC
  Filled 2022-12-11 – 2022-12-15 (×2): qty 300, 100d supply, fill #0
  Filled 2023-04-03: qty 300, 100d supply, fill #1

## 2022-12-11 MED ORDER — ERGOCALCIFEROL 1.25 MG (50000 UT) PO CAPS
50000.0000 [IU] | ORAL_CAPSULE | ORAL | 0 refills | Status: DC
  Filled 2022-12-11 – 2022-12-13 (×2): qty 12, 84d supply, fill #0

## 2022-12-12 ENCOUNTER — Other Ambulatory Visit: Payer: Self-pay

## 2022-12-12 ENCOUNTER — Other Ambulatory Visit (HOSPITAL_COMMUNITY): Payer: Self-pay

## 2022-12-12 DIAGNOSIS — M25572 Pain in left ankle and joints of left foot: Secondary | ICD-10-CM | POA: Diagnosis not present

## 2022-12-12 DIAGNOSIS — M6281 Muscle weakness (generalized): Secondary | ICD-10-CM | POA: Diagnosis not present

## 2022-12-12 DIAGNOSIS — Z5333 Arthroscopic surgical procedure converted to open procedure: Secondary | ICD-10-CM | POA: Diagnosis not present

## 2022-12-12 DIAGNOSIS — R262 Difficulty in walking, not elsewhere classified: Secondary | ICD-10-CM | POA: Diagnosis not present

## 2022-12-13 ENCOUNTER — Other Ambulatory Visit (HOSPITAL_COMMUNITY): Payer: Self-pay

## 2022-12-13 ENCOUNTER — Other Ambulatory Visit: Payer: Self-pay

## 2022-12-14 ENCOUNTER — Other Ambulatory Visit (HOSPITAL_COMMUNITY): Payer: Self-pay

## 2022-12-14 DIAGNOSIS — M25572 Pain in left ankle and joints of left foot: Secondary | ICD-10-CM | POA: Diagnosis not present

## 2022-12-14 DIAGNOSIS — Z5333 Arthroscopic surgical procedure converted to open procedure: Secondary | ICD-10-CM | POA: Diagnosis not present

## 2022-12-14 DIAGNOSIS — R262 Difficulty in walking, not elsewhere classified: Secondary | ICD-10-CM | POA: Diagnosis not present

## 2022-12-14 DIAGNOSIS — M6281 Muscle weakness (generalized): Secondary | ICD-10-CM | POA: Diagnosis not present

## 2022-12-15 ENCOUNTER — Other Ambulatory Visit: Payer: Self-pay

## 2022-12-15 ENCOUNTER — Other Ambulatory Visit (HOSPITAL_COMMUNITY): Payer: Self-pay

## 2022-12-19 ENCOUNTER — Other Ambulatory Visit: Payer: Self-pay | Admitting: Podiatry

## 2022-12-19 ENCOUNTER — Ambulatory Visit (INDEPENDENT_AMBULATORY_CARE_PROVIDER_SITE_OTHER): Payer: HMO

## 2022-12-19 ENCOUNTER — Ambulatory Visit (INDEPENDENT_AMBULATORY_CARE_PROVIDER_SITE_OTHER): Payer: HMO | Admitting: Podiatry

## 2022-12-19 DIAGNOSIS — M949 Disorder of cartilage, unspecified: Secondary | ICD-10-CM | POA: Diagnosis not present

## 2022-12-19 DIAGNOSIS — R262 Difficulty in walking, not elsewhere classified: Secondary | ICD-10-CM | POA: Diagnosis not present

## 2022-12-19 DIAGNOSIS — M79674 Pain in right toe(s): Secondary | ICD-10-CM | POA: Diagnosis not present

## 2022-12-19 DIAGNOSIS — M899 Disorder of bone, unspecified: Secondary | ICD-10-CM

## 2022-12-19 DIAGNOSIS — Z5333 Arthroscopic surgical procedure converted to open procedure: Secondary | ICD-10-CM | POA: Diagnosis not present

## 2022-12-19 DIAGNOSIS — L600 Ingrowing nail: Secondary | ICD-10-CM

## 2022-12-19 DIAGNOSIS — M25572 Pain in left ankle and joints of left foot: Secondary | ICD-10-CM | POA: Diagnosis not present

## 2022-12-19 DIAGNOSIS — M6281 Muscle weakness (generalized): Secondary | ICD-10-CM | POA: Diagnosis not present

## 2022-12-19 DIAGNOSIS — M7750 Other enthesopathy of unspecified foot: Secondary | ICD-10-CM

## 2022-12-19 NOTE — Progress Notes (Signed)
Subjective: Chief Complaint  Patient presents with   Routine Post Op   67 year old female presents the office for above concerns.  She said that she was doing well with physical therapy and so she went back to the [facility] doing Silver sneakers and since then she has had some pain of the ankle as well as swelling.  No injuries that she reports.  She points on the ankle but also on the posterior heel, medial ankle where she is majority discomfort.  No injuries.  She does consider her right big toe.  She is been getting some tenderness and she is not sure what, the nail is proximal to this.  She plans on the IPJ.  No injuries.  No other lesions.   Objective: General: No acute distress, AAOx3  DP/PT pulses palpable 2/4, CRT < 3 sec to all digits.  Protective sensation intact. Motor function intact.  Left  foot: Incision is well coapted without any evidence of dehiscence.  Scars are well-formed.  There is mild discomfort on the anterior medial aspect ankle is rated at send along the course of the flexor tendons she also states discomfort along the Achilles tendon.  Clinically the tendons appears intact.  No area pinpoint tenderness. Right foot: Mild palpation on the medial aspect of right hallux toenail with some dried skin buildup.  There is no drainage or pus.  No edema, erythema.  Minimal callus formation of the hallux IPJ without any underlying ulceration drainage or signs of infection.  No area pinpoint tenderness. No pain with calf compression, swelling, warmth, erythema.   Assessment and Plan:  Status post left ankle surgery, right toe pain  -Treatment options discussed including all alternatives, risks, and complications -X-rays were obtained reviewed of the ankle.  3 views of the ankle were obtained.  There is no evidence of acute fracture. -Advised ankle dispensed a compression anklet.  Will use Voltaren gel as she can do oral anti-inflammatories as she has had a lot of steroids previously  and wants to hold off.  She she continues to try to get worse as needed.  Continue stretching, rehab exercises.  To hold off on a steroid injection at this time given as most of pain along the area of the tendons.  Continue shoes and good arch support. -Sharp debridement of right hallux no any complications or bleeding.  Recommend Epsom salt soaks.  Also debrided the callus as a courtesy having complications or bleeding.  Offloading.  Vivi Barrack DPM

## 2022-12-19 NOTE — Patient Instructions (Signed)
Soak Instructions    THE DAY AFTER THE PROCEDURE  Place 1/4 cup of epsom salts in a quart of warm tap water.  Submerge your foot or feet with outer bandage intact for the initial soak; this will allow the bandage to become moist and wet for easy lift off.  Once you remove your bandage, continue to soak in the solution for 20 minutes.  This soak should be done twice a day.  Next, remove your foot or feet from solution, blot dry the affected area and cover.  You may use a band aid large enough to cover the area or use gauze and tape.  Apply other medications to the area as directed by the doctor such as polysporin neosporin.  IF YOUR SKIN BECOMES IRRITATED WHILE USING THESE INSTRUCTIONS, IT IS OKAY TO SWITCH TO  WHITE VINEGAR AND WATER. Or you may use antibacterial soap and water to keep the toe clean  Monitor for any signs/symptoms of infection. Call the office immediately if any occur or go directly to the emergency room. Call with any questions/concerns.   --  Achilles Tendinitis  with Rehab Achilles tendinitis is a disorder of the Achilles tendon. The Achilles tendon connects the large calf muscles (Gastrocnemius and Soleus) to the heel bone (calcaneus). This tendon is sometimes called the heel cord. It is important for pushing-off and standing on your toes and is important for walking, running, or jumping. Tendinitis is often caused by overuse and repetitive microtrauma. SYMPTOMS Pain, tenderness, swelling, warmth, and redness may occur over the Achilles tendon even at rest. Pain with pushing off, or flexing or extending the ankle. Pain that is worsened after or during activity. CAUSES  Overuse sometimes seen with rapid increase in exercise programs or in sports requiring running and jumping. Poor physical conditioning (strength and flexibility or endurance). Running sports, especially training running down hills. Inadequate warm-up before practice or play or failure to stretch before  participation. Injury to the tendon. PREVENTION  Warm up and stretch before practice or competition. Allow time for adequate rest and recovery between practices and competition. Keep up conditioning. Keep up ankle and leg flexibility. Improve or keep muscle strength and endurance. Improve cardiovascular fitness. Use proper technique. Use proper equipment (shoes, skates). To help prevent recurrence, taping, protective strapping, or an adhesive bandage may be recommended for several weeks after healing is complete. PROGNOSIS  Recovery may take weeks to several months to heal. Longer recovery is expected if symptoms have been prolonged. Recovery is usually quicker if the inflammation is due to a direct blow as compared with overuse or sudden strain. RELATED COMPLICATIONS  Healing time will be prolonged if the condition is not correctly treated. The injury must be given plenty of time to heal. Symptoms can reoccur if activity is resumed too soon. Untreated, tendinitis may increase the risk of tendon rupture requiring additional time for recovery and possibly surgery. TREATMENT  The first treatment consists of rest anti-inflammatory medication, and ice to relieve the pain. Stretching and strengthening exercises after resolution of pain will likely help reduce the risk of recurrence. Referral to a physical therapist or athletic trainer for further evaluation and treatment may be helpful. A walking boot or cast may be recommended to rest the Achilles tendon. This can help break the cycle of inflammation and microtrauma. Arch supports (orthotics) may be prescribed or recommended by your caregiver as an adjunct to therapy and rest. Surgery to remove the inflamed tendon lining or degenerated tendon tissue is rarely  necessary and has shown less than predictable results. MEDICATION  Nonsteroidal anti-inflammatory medications, such as aspirin and ibuprofen, may be used for pain and inflammation relief.  Do not take within 7 days before surgery. Take these as directed by your caregiver. Contact your caregiver immediately if any bleeding, stomach upset, or signs of allergic reaction occur. Other minor pain relievers, such as acetaminophen, may also be used. Pain relievers may be prescribed as necessary by your caregiver. Do not take prescription pain medication for longer than 4 to 7 days. Use only as directed and only as much as you need. Cortisone injections are rarely indicated. Cortisone injections may weaken tendons and predispose to rupture. It is better to give the condition more time to heal than to use them. HEAT AND COLD Cold is used to relieve pain and reduce inflammation for acute and chronic Achilles tendinitis. Cold should be applied for 10 to 15 minutes every 2 to 3 hours for inflammation and pain and immediately after any activity that aggravates your symptoms. Use ice packs or an ice massage. Heat may be used before performing stretching and strengthening activities prescribed by your caregiver. Use a heat pack or a warm soak. SEEK MEDICAL CARE IF: Symptoms get worse or do not improve in 2 weeks despite treatment. New, unexplained symptoms develop. Drugs used in treatment may produce side effects.  EXERCISES:  RANGE OF MOTION (ROM) AND STRETCHING EXERCISES - Achilles Tendinitis  These exercises may help you when beginning to rehabilitate your injury. Your symptoms may resolve with or without further involvement from your physician, physical therapist or athletic trainer. While completing these exercises, remember:  Restoring tissue flexibility helps normal motion to return to the joints. This allows healthier, less painful movement and activity. An effective stretch should be held for at least 30 seconds. A stretch should never be painful. You should only feel a gentle lengthening or release in the stretched tissue.  STRETCH  Gastroc, Standing  Place hands on wall. Extend right /  left leg, keeping the front knee somewhat bent. Slightly point your toes inward on your back foot. Keeping your right / left heel on the floor and your knee straight, shift your weight toward the wall, not allowing your back to arch. You should feel a gentle stretch in the right / left calf. Hold this position for 10 seconds. Repeat 3 times. Complete this stretch 2 times per day.  STRETCH  Soleus, Standing  Place hands on wall. Extend right / left leg, keeping the other knee somewhat bent. Slightly point your toes inward on your back foot. Keep your right / left heel on the floor, bend your back knee, and slightly shift your weight over the back leg so that you feel a gentle stretch deep in your back calf. Hold this position for 10 seconds. Repeat 3 times. Complete this stretch 2 times per day.  STRETCH  Gastrocsoleus, Standing  Note: This exercise can place a lot of stress on your foot and ankle. Please complete this exercise only if specifically instructed by your caregiver.  Place the ball of your right / left foot on a step, keeping your other foot firmly on the same step. Hold on to the wall or a rail for balance. Slowly lift your other foot, allowing your body weight to press your heel down over the edge of the step. You should feel a stretch in your right / left calf. Hold this position for 10 seconds. Repeat this exercise with a slight  bend in your knee. Repeat 3 times. Complete this stretch 2 times per day.   STRENGTHENING EXERCISES - Achilles Tendinitis These exercises may help you when beginning to rehabilitate your injury. They may resolve your symptoms with or without further involvement from your physician, physical therapist or athletic trainer. While completing these exercises, remember:  Muscles can gain both the endurance and the strength needed for everyday activities through controlled exercises. Complete these exercises as instructed by your physician, physical  therapist or athletic trainer. Progress the resistance and repetitions only as guided. You may experience muscle soreness or fatigue, but the pain or discomfort you are trying to eliminate should never worsen during these exercises. If this pain does worsen, stop and make certain you are following the directions exactly. If the pain is still present after adjustments, discontinue the exercise until you can discuss the trouble with your clinician.  STRENGTH - Plantar-flexors  Sit with your right / left leg extended. Holding onto both ends of a rubber exercise band/tubing, loop it around the ball of your foot. Keep a slight tension in the band. Slowly push your toes away from you, pointing them downward. Hold this position for 10 seconds. Return slowly, controlling the tension in the band/tubing. Repeat 3 times. Complete this exercise 2 times per day.   STRENGTH - Plantar-flexors  Stand with your feet shoulder width apart. Steady yourself with a wall or table using as little support as needed. Keeping your weight evenly spread over the width of your feet, rise up on your toes.* Hold this position for 10 seconds. Repeat 3 times. Complete this exercise 2 times per day.  *If this is too easy, shift your weight toward your right / left leg until you feel challenged. Ultimately, you may be asked to do this exercise with your right / left foot only.  STRENGTH  Plantar-flexors, Eccentric  Note: This exercise can place a lot of stress on your foot and ankle. Please complete this exercise only if specifically instructed by your caregiver.  Place the balls of your feet on a step. With your hands, use only enough support from a wall or rail to keep your balance. Keep your knees straight and rise up on your toes. Slowly shift your weight entirely to your right / left toes and pick up your opposite foot. Gently and with controlled movement, lower your weight through your right / left foot so that your heel  drops below the level of the step. You will feel a slight stretch in the back of your calf at the end position. Use the healthy leg to help rise up onto the balls of both feet, then lower weight only on the right / left leg again. Build up to 15 repetitions. Then progress to 3 consecutive sets of 15 repetitions.* After completing the above exercise, complete the same exercise with a slight knee bend (about 30 degrees). Again, build up to 15 repetitions. Then progress to 3 consecutive sets of 15 repetitions.* Perform this exercise 2 times per day.  *When you easily complete 3 sets of 15, your physician, physical therapist or athletic trainer may advise you to add resistance by wearing a backpack filled with additional weight.  STRENGTH - Plantar Flexors, Seated  Sit on a chair that allows your feet to rest flat on the ground. If necessary, sit at the edge of the chair. Keeping your toes firmly on the ground, lift your right / left heel as far as you can without increasing  any discomfort in your ankle. Repeat 3 times. Complete this exercise 2 times a day.

## 2022-12-20 ENCOUNTER — Telehealth: Payer: Self-pay | Admitting: Podiatry

## 2022-12-20 ENCOUNTER — Other Ambulatory Visit: Payer: Self-pay | Admitting: Podiatry

## 2022-12-20 MED ORDER — METHYLPREDNISOLONE 4 MG PO TBPK
ORAL_TABLET | ORAL | 0 refills | Status: DC
Start: 2022-12-20 — End: 2023-07-23

## 2022-12-20 NOTE — Telephone Encounter (Signed)
Pt called and was seen yesterday and you discussed a steroid and she did not want to take it yesterday. She has decided today that the pain is getting worse and she would like to get the steroid. Can you send it to [facility] in [place] please.

## 2022-12-21 DIAGNOSIS — M25572 Pain in left ankle and joints of left foot: Secondary | ICD-10-CM | POA: Diagnosis not present

## 2022-12-21 DIAGNOSIS — R262 Difficulty in walking, not elsewhere classified: Secondary | ICD-10-CM | POA: Diagnosis not present

## 2022-12-21 DIAGNOSIS — Z5333 Arthroscopic surgical procedure converted to open procedure: Secondary | ICD-10-CM | POA: Diagnosis not present

## 2022-12-21 DIAGNOSIS — M6281 Muscle weakness (generalized): Secondary | ICD-10-CM | POA: Diagnosis not present

## 2022-12-26 DIAGNOSIS — M25572 Pain in left ankle and joints of left foot: Secondary | ICD-10-CM | POA: Diagnosis not present

## 2022-12-26 DIAGNOSIS — M6281 Muscle weakness (generalized): Secondary | ICD-10-CM | POA: Diagnosis not present

## 2022-12-26 DIAGNOSIS — Z5333 Arthroscopic surgical procedure converted to open procedure: Secondary | ICD-10-CM | POA: Diagnosis not present

## 2022-12-26 DIAGNOSIS — R262 Difficulty in walking, not elsewhere classified: Secondary | ICD-10-CM | POA: Diagnosis not present

## 2022-12-28 DIAGNOSIS — M6281 Muscle weakness (generalized): Secondary | ICD-10-CM | POA: Diagnosis not present

## 2022-12-28 DIAGNOSIS — M25572 Pain in left ankle and joints of left foot: Secondary | ICD-10-CM | POA: Diagnosis not present

## 2022-12-28 DIAGNOSIS — R262 Difficulty in walking, not elsewhere classified: Secondary | ICD-10-CM | POA: Diagnosis not present

## 2022-12-28 DIAGNOSIS — Z5333 Arthroscopic surgical procedure converted to open procedure: Secondary | ICD-10-CM | POA: Diagnosis not present

## 2023-01-04 DIAGNOSIS — M6281 Muscle weakness (generalized): Secondary | ICD-10-CM | POA: Diagnosis not present

## 2023-01-04 DIAGNOSIS — Z5333 Arthroscopic surgical procedure converted to open procedure: Secondary | ICD-10-CM | POA: Diagnosis not present

## 2023-01-04 DIAGNOSIS — R262 Difficulty in walking, not elsewhere classified: Secondary | ICD-10-CM | POA: Diagnosis not present

## 2023-01-04 DIAGNOSIS — M25572 Pain in left ankle and joints of left foot: Secondary | ICD-10-CM | POA: Diagnosis not present

## 2023-01-24 ENCOUNTER — Other Ambulatory Visit: Payer: Self-pay | Admitting: Endocrinology

## 2023-01-24 DIAGNOSIS — E041 Nontoxic single thyroid nodule: Secondary | ICD-10-CM

## 2023-01-30 ENCOUNTER — Ambulatory Visit (INDEPENDENT_AMBULATORY_CARE_PROVIDER_SITE_OTHER): Payer: HMO | Admitting: Podiatry

## 2023-01-30 DIAGNOSIS — M7750 Other enthesopathy of unspecified foot: Secondary | ICD-10-CM

## 2023-01-30 DIAGNOSIS — M899 Disorder of bone, unspecified: Secondary | ICD-10-CM | POA: Diagnosis not present

## 2023-01-30 DIAGNOSIS — M949 Disorder of cartilage, unspecified: Secondary | ICD-10-CM

## 2023-01-30 NOTE — Progress Notes (Signed)
Subjective: Chief Complaint  Patient presents with   Routine Post Op    POV #6 DOS 10/11/2022 LT ANKLE ARTHSCOPY W/MICROFRACTURE & CLEANING OF THE ANKLE JOINT    67 year old female presents the office for above concerns.  She states the good part is the area of the surgery and the front of her ankle is doing well not having any pain with this but she still gets some swelling to the ankle she points more along the medial aspect of the ankle posterior to the medial malleolus where she gets discomfort going up the side.   Objective: General: No acute distress, AAOx3  DP/PT pulses palpable 2/4, CRT < 3 sec to all digits.  Protective sensation intact. Motor function intact.  Left  foot: Incision is well coapted without any evidence of dehiscence.  The tenderness that was present prior to surgical and anterior lateral ankle joint has resolved there is no significant edema to this area.  There is some edema localized to the ankle but does not go into the calf there is no pain with calf compression, erythema or warmth in the calf is supple.  There is no area pinpoint tenderness.  The majority of this is localized along the posterior tibial, flexor tendons.  Tendons clinically appear to be intact.  There is no pain on the peroneal tendons. No pain with calf compression, swelling, warmth, erythema.   Assessment and Plan:  Status post left ankle surgery, right toe pain  -Treatment options discussed including all alternatives, risks, and complications -Surgery perspective she seems to be doing well.  She has new areas of tendinitis and suspect on the medial ankle.  She gets some swelling to the ankle but does not go into the calf but no other symptoms of DVT or blood clots noted.  Given the tendinitis and refer her back to physical therapy to work on strengthening but also consider iontophoresis and/or dry needling.  We discussed shoe modifications.  She is currently wearing Hoka's but she states they are not  very comfortable.  Her most favorite shoes are new balance we discussed going back to wearing those shoes that they are always using look at getting a new pair.  Continue ice, elevate as well as compression.  Vivi Barrack DPM

## 2023-02-21 ENCOUNTER — Ambulatory Visit
Admission: RE | Admit: 2023-02-21 | Discharge: 2023-02-21 | Disposition: A | Discharge status: Home / Self Care | Payer: HMO | Source: Ambulatory Visit | Attending: Endocrinology | Admitting: Endocrinology

## 2023-02-21 DIAGNOSIS — E042 Nontoxic multinodular goiter: Secondary | ICD-10-CM | POA: Diagnosis not present

## 2023-02-21 DIAGNOSIS — E041 Nontoxic single thyroid nodule: Secondary | ICD-10-CM

## 2023-02-28 DIAGNOSIS — E559 Vitamin D deficiency, unspecified: Secondary | ICD-10-CM | POA: Diagnosis not present

## 2023-02-28 DIAGNOSIS — E039 Hypothyroidism, unspecified: Secondary | ICD-10-CM | POA: Diagnosis not present

## 2023-03-05 DIAGNOSIS — L570 Actinic keratosis: Secondary | ICD-10-CM | POA: Diagnosis not present

## 2023-03-07 ENCOUNTER — Other Ambulatory Visit (HOSPITAL_COMMUNITY): Payer: Self-pay

## 2023-03-07 ENCOUNTER — Other Ambulatory Visit (HOSPITAL_BASED_OUTPATIENT_CLINIC_OR_DEPARTMENT_OTHER): Payer: Self-pay

## 2023-03-07 ENCOUNTER — Other Ambulatory Visit: Payer: Self-pay | Admitting: Endocrinology

## 2023-03-07 DIAGNOSIS — M059 Rheumatoid arthritis with rheumatoid factor, unspecified: Secondary | ICD-10-CM | POA: Diagnosis not present

## 2023-03-07 DIAGNOSIS — Z86018 Personal history of other benign neoplasm: Secondary | ICD-10-CM | POA: Diagnosis not present

## 2023-03-07 DIAGNOSIS — M47819 Spondylosis without myelopathy or radiculopathy, site unspecified: Secondary | ICD-10-CM | POA: Diagnosis not present

## 2023-03-07 DIAGNOSIS — I10 Essential (primary) hypertension: Secondary | ICD-10-CM | POA: Diagnosis not present

## 2023-03-07 DIAGNOSIS — M810 Age-related osteoporosis without current pathological fracture: Secondary | ICD-10-CM | POA: Diagnosis not present

## 2023-03-07 DIAGNOSIS — D649 Anemia, unspecified: Secondary | ICD-10-CM | POA: Diagnosis not present

## 2023-03-07 DIAGNOSIS — E559 Vitamin D deficiency, unspecified: Secondary | ICD-10-CM | POA: Diagnosis not present

## 2023-03-07 DIAGNOSIS — E041 Nontoxic single thyroid nodule: Secondary | ICD-10-CM | POA: Diagnosis not present

## 2023-03-07 DIAGNOSIS — M069 Rheumatoid arthritis, unspecified: Secondary | ICD-10-CM | POA: Diagnosis not present

## 2023-03-07 DIAGNOSIS — E039 Hypothyroidism, unspecified: Secondary | ICD-10-CM | POA: Diagnosis not present

## 2023-03-07 MED ORDER — LEVOTHYROXINE SODIUM 75 MCG PO TABS
75.0000 ug | ORAL_TABLET | Freq: Every morning | ORAL | 0 refills | Status: DC
  Filled 2023-03-07: qty 30, 30d supply, fill #0

## 2023-03-08 ENCOUNTER — Other Ambulatory Visit (HOSPITAL_COMMUNITY): Payer: Self-pay

## 2023-03-08 DIAGNOSIS — Z299 Encounter for prophylactic measures, unspecified: Secondary | ICD-10-CM | POA: Diagnosis not present

## 2023-03-08 DIAGNOSIS — Z0000 Encounter for general adult medical examination without abnormal findings: Secondary | ICD-10-CM | POA: Diagnosis not present

## 2023-03-08 DIAGNOSIS — I10 Essential (primary) hypertension: Secondary | ICD-10-CM | POA: Diagnosis not present

## 2023-03-08 DIAGNOSIS — Z1339 Encounter for screening examination for other mental health and behavioral disorders: Secondary | ICD-10-CM | POA: Diagnosis not present

## 2023-03-08 DIAGNOSIS — Z7189 Other specified counseling: Secondary | ICD-10-CM | POA: Diagnosis not present

## 2023-03-08 DIAGNOSIS — Z6837 Body mass index (BMI) 37.0-37.9, adult: Secondary | ICD-10-CM | POA: Diagnosis not present

## 2023-03-08 DIAGNOSIS — Z1331 Encounter for screening for depression: Secondary | ICD-10-CM | POA: Diagnosis not present

## 2023-03-08 DIAGNOSIS — M069 Rheumatoid arthritis, unspecified: Secondary | ICD-10-CM | POA: Diagnosis not present

## 2023-03-08 DIAGNOSIS — F339 Major depressive disorder, recurrent, unspecified: Secondary | ICD-10-CM | POA: Diagnosis not present

## 2023-03-09 ENCOUNTER — Other Ambulatory Visit (HOSPITAL_COMMUNITY): Payer: Self-pay

## 2023-03-09 MED ORDER — TIZANIDINE HCL 4 MG PO TABS
4.0000 mg | ORAL_TABLET | Freq: Three times a day (TID) | ORAL | 0 refills | Status: DC | PRN
  Filled 2023-03-09: qty 270, 90d supply, fill #0

## 2023-03-09 MED ORDER — ERGOCALCIFEROL 1.25 MG (50000 UT) PO CAPS
50000.0000 [IU] | ORAL_CAPSULE | ORAL | 0 refills | Status: DC
  Filled 2023-03-09: qty 12, 84d supply, fill #0

## 2023-03-10 ENCOUNTER — Other Ambulatory Visit (HOSPITAL_COMMUNITY): Payer: Self-pay

## 2023-03-23 DIAGNOSIS — M9902 Segmental and somatic dysfunction of thoracic region: Secondary | ICD-10-CM | POA: Diagnosis not present

## 2023-03-23 DIAGNOSIS — M9903 Segmental and somatic dysfunction of lumbar region: Secondary | ICD-10-CM | POA: Diagnosis not present

## 2023-03-23 DIAGNOSIS — M47816 Spondylosis without myelopathy or radiculopathy, lumbar region: Secondary | ICD-10-CM | POA: Diagnosis not present

## 2023-03-23 DIAGNOSIS — S233XXA Sprain of ligaments of thoracic spine, initial encounter: Secondary | ICD-10-CM | POA: Diagnosis not present

## 2023-03-23 DIAGNOSIS — M9901 Segmental and somatic dysfunction of cervical region: Secondary | ICD-10-CM | POA: Diagnosis not present

## 2023-03-23 DIAGNOSIS — S134XXA Sprain of ligaments of cervical spine, initial encounter: Secondary | ICD-10-CM | POA: Diagnosis not present

## 2023-03-26 DIAGNOSIS — M9903 Segmental and somatic dysfunction of lumbar region: Secondary | ICD-10-CM | POA: Diagnosis not present

## 2023-03-26 DIAGNOSIS — S233XXA Sprain of ligaments of thoracic spine, initial encounter: Secondary | ICD-10-CM | POA: Diagnosis not present

## 2023-03-26 DIAGNOSIS — S134XXA Sprain of ligaments of cervical spine, initial encounter: Secondary | ICD-10-CM | POA: Diagnosis not present

## 2023-03-26 DIAGNOSIS — M47816 Spondylosis without myelopathy or radiculopathy, lumbar region: Secondary | ICD-10-CM | POA: Diagnosis not present

## 2023-03-26 DIAGNOSIS — M9901 Segmental and somatic dysfunction of cervical region: Secondary | ICD-10-CM | POA: Diagnosis not present

## 2023-03-26 DIAGNOSIS — M9902 Segmental and somatic dysfunction of thoracic region: Secondary | ICD-10-CM | POA: Diagnosis not present

## 2023-03-26 DIAGNOSIS — E2839 Other primary ovarian failure: Secondary | ICD-10-CM | POA: Diagnosis not present

## 2023-03-27 ENCOUNTER — Encounter: Payer: Self-pay | Admitting: Physician Assistant

## 2023-03-29 DIAGNOSIS — M47816 Spondylosis without myelopathy or radiculopathy, lumbar region: Secondary | ICD-10-CM | POA: Diagnosis not present

## 2023-03-29 DIAGNOSIS — S233XXA Sprain of ligaments of thoracic spine, initial encounter: Secondary | ICD-10-CM | POA: Diagnosis not present

## 2023-03-29 DIAGNOSIS — S134XXA Sprain of ligaments of cervical spine, initial encounter: Secondary | ICD-10-CM | POA: Diagnosis not present

## 2023-03-29 DIAGNOSIS — M9901 Segmental and somatic dysfunction of cervical region: Secondary | ICD-10-CM | POA: Diagnosis not present

## 2023-03-29 DIAGNOSIS — M9902 Segmental and somatic dysfunction of thoracic region: Secondary | ICD-10-CM | POA: Diagnosis not present

## 2023-03-29 DIAGNOSIS — M9903 Segmental and somatic dysfunction of lumbar region: Secondary | ICD-10-CM | POA: Diagnosis not present

## 2023-03-31 ENCOUNTER — Other Ambulatory Visit (HOSPITAL_COMMUNITY): Payer: Self-pay

## 2023-03-31 MED ORDER — LEVOTHYROXINE SODIUM 75 MCG PO TABS
75.0000 ug | ORAL_TABLET | Freq: Every morning | ORAL | 5 refills | Status: DC
  Filled 2023-03-31: qty 30, 30d supply, fill #0

## 2023-04-02 ENCOUNTER — Other Ambulatory Visit (HOSPITAL_COMMUNITY): Payer: Self-pay

## 2023-04-02 ENCOUNTER — Ambulatory Visit
Admission: RE | Admit: 2023-04-02 | Discharge: 2023-04-02 | Disposition: A | Discharge status: Home / Self Care | Payer: HMO | Source: Ambulatory Visit | Attending: Endocrinology | Admitting: Endocrinology

## 2023-04-02 DIAGNOSIS — Z86018 Personal history of other benign neoplasm: Secondary | ICD-10-CM

## 2023-04-02 DIAGNOSIS — D3502 Benign neoplasm of left adrenal gland: Secondary | ICD-10-CM | POA: Diagnosis not present

## 2023-04-03 ENCOUNTER — Other Ambulatory Visit (HOSPITAL_COMMUNITY): Payer: Self-pay

## 2023-04-06 DIAGNOSIS — M9902 Segmental and somatic dysfunction of thoracic region: Secondary | ICD-10-CM | POA: Diagnosis not present

## 2023-04-06 DIAGNOSIS — S233XXA Sprain of ligaments of thoracic spine, initial encounter: Secondary | ICD-10-CM | POA: Diagnosis not present

## 2023-04-06 DIAGNOSIS — M47816 Spondylosis without myelopathy or radiculopathy, lumbar region: Secondary | ICD-10-CM | POA: Diagnosis not present

## 2023-04-06 DIAGNOSIS — M9903 Segmental and somatic dysfunction of lumbar region: Secondary | ICD-10-CM | POA: Diagnosis not present

## 2023-04-06 DIAGNOSIS — S134XXA Sprain of ligaments of cervical spine, initial encounter: Secondary | ICD-10-CM | POA: Diagnosis not present

## 2023-04-06 DIAGNOSIS — M9901 Segmental and somatic dysfunction of cervical region: Secondary | ICD-10-CM | POA: Diagnosis not present

## 2023-04-10 DIAGNOSIS — M069 Rheumatoid arthritis, unspecified: Secondary | ICD-10-CM | POA: Diagnosis not present

## 2023-04-10 DIAGNOSIS — Z299 Encounter for prophylactic measures, unspecified: Secondary | ICD-10-CM | POA: Diagnosis not present

## 2023-04-10 DIAGNOSIS — M778 Other enthesopathies, not elsewhere classified: Secondary | ICD-10-CM | POA: Diagnosis not present

## 2023-04-10 DIAGNOSIS — I10 Essential (primary) hypertension: Secondary | ICD-10-CM | POA: Diagnosis not present

## 2023-04-10 DIAGNOSIS — F339 Major depressive disorder, recurrent, unspecified: Secondary | ICD-10-CM | POA: Diagnosis not present

## 2023-04-11 DIAGNOSIS — S134XXA Sprain of ligaments of cervical spine, initial encounter: Secondary | ICD-10-CM | POA: Diagnosis not present

## 2023-04-11 DIAGNOSIS — M9903 Segmental and somatic dysfunction of lumbar region: Secondary | ICD-10-CM | POA: Diagnosis not present

## 2023-04-11 DIAGNOSIS — S233XXA Sprain of ligaments of thoracic spine, initial encounter: Secondary | ICD-10-CM | POA: Diagnosis not present

## 2023-04-11 DIAGNOSIS — M9902 Segmental and somatic dysfunction of thoracic region: Secondary | ICD-10-CM | POA: Diagnosis not present

## 2023-04-11 DIAGNOSIS — M47816 Spondylosis without myelopathy or radiculopathy, lumbar region: Secondary | ICD-10-CM | POA: Diagnosis not present

## 2023-04-11 DIAGNOSIS — M9901 Segmental and somatic dysfunction of cervical region: Secondary | ICD-10-CM | POA: Diagnosis not present

## 2023-04-16 DIAGNOSIS — I10 Essential (primary) hypertension: Secondary | ICD-10-CM | POA: Diagnosis not present

## 2023-04-16 DIAGNOSIS — Z299 Encounter for prophylactic measures, unspecified: Secondary | ICD-10-CM | POA: Diagnosis not present

## 2023-04-16 DIAGNOSIS — B009 Herpesviral infection, unspecified: Secondary | ICD-10-CM | POA: Diagnosis not present

## 2023-04-16 DIAGNOSIS — R21 Rash and other nonspecific skin eruption: Secondary | ICD-10-CM | POA: Diagnosis not present

## 2023-04-16 DIAGNOSIS — W57XXXA Bitten or stung by nonvenomous insect and other nonvenomous arthropods, initial encounter: Secondary | ICD-10-CM | POA: Diagnosis not present

## 2023-04-17 DIAGNOSIS — M6281 Muscle weakness (generalized): Secondary | ICD-10-CM | POA: Diagnosis not present

## 2023-04-17 DIAGNOSIS — M25511 Pain in right shoulder: Secondary | ICD-10-CM | POA: Diagnosis not present

## 2023-04-18 DIAGNOSIS — Z86018 Personal history of other benign neoplasm: Secondary | ICD-10-CM | POA: Diagnosis not present

## 2023-04-18 DIAGNOSIS — E039 Hypothyroidism, unspecified: Secondary | ICD-10-CM | POA: Diagnosis not present

## 2023-04-18 DIAGNOSIS — D649 Anemia, unspecified: Secondary | ICD-10-CM | POA: Diagnosis not present

## 2023-04-18 DIAGNOSIS — I10 Essential (primary) hypertension: Secondary | ICD-10-CM | POA: Diagnosis not present

## 2023-04-19 ENCOUNTER — Other Ambulatory Visit (HOSPITAL_COMMUNITY): Payer: Self-pay

## 2023-04-19 DIAGNOSIS — H16223 Keratoconjunctivitis sicca, not specified as Sjogren's, bilateral: Secondary | ICD-10-CM | POA: Diagnosis not present

## 2023-04-19 DIAGNOSIS — Z961 Presence of intraocular lens: Secondary | ICD-10-CM | POA: Diagnosis not present

## 2023-04-24 ENCOUNTER — Other Ambulatory Visit: Payer: Self-pay | Admitting: Internal Medicine

## 2023-04-24 DIAGNOSIS — Z1231 Encounter for screening mammogram for malignant neoplasm of breast: Secondary | ICD-10-CM

## 2023-05-11 DIAGNOSIS — M25511 Pain in right shoulder: Secondary | ICD-10-CM | POA: Diagnosis not present

## 2023-05-11 DIAGNOSIS — M6281 Muscle weakness (generalized): Secondary | ICD-10-CM | POA: Diagnosis not present

## 2023-05-15 DIAGNOSIS — M6281 Muscle weakness (generalized): Secondary | ICD-10-CM | POA: Diagnosis not present

## 2023-05-15 DIAGNOSIS — M069 Rheumatoid arthritis, unspecified: Secondary | ICD-10-CM | POA: Diagnosis not present

## 2023-05-15 DIAGNOSIS — M25511 Pain in right shoulder: Secondary | ICD-10-CM | POA: Diagnosis not present

## 2023-05-15 DIAGNOSIS — R42 Dizziness and giddiness: Secondary | ICD-10-CM | POA: Diagnosis not present

## 2023-05-15 DIAGNOSIS — I10 Essential (primary) hypertension: Secondary | ICD-10-CM | POA: Diagnosis not present

## 2023-05-15 DIAGNOSIS — Z299 Encounter for prophylactic measures, unspecified: Secondary | ICD-10-CM | POA: Diagnosis not present

## 2023-05-18 DIAGNOSIS — M6281 Muscle weakness (generalized): Secondary | ICD-10-CM | POA: Diagnosis not present

## 2023-05-18 DIAGNOSIS — M25511 Pain in right shoulder: Secondary | ICD-10-CM | POA: Diagnosis not present

## 2023-05-22 DIAGNOSIS — M6281 Muscle weakness (generalized): Secondary | ICD-10-CM | POA: Diagnosis not present

## 2023-05-22 DIAGNOSIS — M25551 Pain in right hip: Secondary | ICD-10-CM | POA: Diagnosis not present

## 2023-05-22 DIAGNOSIS — M25511 Pain in right shoulder: Secondary | ICD-10-CM | POA: Diagnosis not present

## 2023-05-24 DIAGNOSIS — I10 Essential (primary) hypertension: Secondary | ICD-10-CM | POA: Diagnosis not present

## 2023-05-24 DIAGNOSIS — R232 Flushing: Secondary | ICD-10-CM | POA: Diagnosis not present

## 2023-05-24 DIAGNOSIS — M069 Rheumatoid arthritis, unspecified: Secondary | ICD-10-CM | POA: Diagnosis not present

## 2023-05-24 DIAGNOSIS — Z299 Encounter for prophylactic measures, unspecified: Secondary | ICD-10-CM | POA: Diagnosis not present

## 2023-05-24 DIAGNOSIS — M6281 Muscle weakness (generalized): Secondary | ICD-10-CM | POA: Diagnosis not present

## 2023-05-24 DIAGNOSIS — M25511 Pain in right shoulder: Secondary | ICD-10-CM | POA: Diagnosis not present

## 2023-05-25 DIAGNOSIS — M9902 Segmental and somatic dysfunction of thoracic region: Secondary | ICD-10-CM | POA: Diagnosis not present

## 2023-05-25 DIAGNOSIS — M9903 Segmental and somatic dysfunction of lumbar region: Secondary | ICD-10-CM | POA: Diagnosis not present

## 2023-05-25 DIAGNOSIS — S233XXA Sprain of ligaments of thoracic spine, initial encounter: Secondary | ICD-10-CM | POA: Diagnosis not present

## 2023-05-25 DIAGNOSIS — M47816 Spondylosis without myelopathy or radiculopathy, lumbar region: Secondary | ICD-10-CM | POA: Diagnosis not present

## 2023-05-25 DIAGNOSIS — M9901 Segmental and somatic dysfunction of cervical region: Secondary | ICD-10-CM | POA: Diagnosis not present

## 2023-05-25 DIAGNOSIS — S134XXA Sprain of ligaments of cervical spine, initial encounter: Secondary | ICD-10-CM | POA: Diagnosis not present

## 2023-05-28 ENCOUNTER — Other Ambulatory Visit (HOSPITAL_COMMUNITY): Payer: Self-pay

## 2023-05-28 MED ORDER — VITAMIN D (ERGOCALCIFEROL) 1.25 MG (50000 UNIT) PO CAPS
50000.0000 [IU] | ORAL_CAPSULE | ORAL | 0 refills | Status: DC
  Filled 2023-05-28: qty 12, 84d supply, fill #0

## 2023-05-29 ENCOUNTER — Other Ambulatory Visit (HOSPITAL_COMMUNITY): Payer: Self-pay

## 2023-05-29 DIAGNOSIS — M25511 Pain in right shoulder: Secondary | ICD-10-CM | POA: Diagnosis not present

## 2023-05-29 DIAGNOSIS — M6281 Muscle weakness (generalized): Secondary | ICD-10-CM | POA: Diagnosis not present

## 2023-05-29 MED ORDER — LEVOTHYROXINE SODIUM 75 MCG PO TABS
75.0000 ug | ORAL_TABLET | ORAL | 5 refills | Status: DC
  Filled 2023-05-29 – 2023-06-01 (×2): qty 90, 90d supply, fill #0

## 2023-05-30 ENCOUNTER — Other Ambulatory Visit (HOSPITAL_COMMUNITY): Payer: Self-pay

## 2023-05-30 DIAGNOSIS — S233XXA Sprain of ligaments of thoracic spine, initial encounter: Secondary | ICD-10-CM | POA: Diagnosis not present

## 2023-05-30 DIAGNOSIS — M9903 Segmental and somatic dysfunction of lumbar region: Secondary | ICD-10-CM | POA: Diagnosis not present

## 2023-05-30 DIAGNOSIS — M47816 Spondylosis without myelopathy or radiculopathy, lumbar region: Secondary | ICD-10-CM | POA: Diagnosis not present

## 2023-05-30 DIAGNOSIS — M9901 Segmental and somatic dysfunction of cervical region: Secondary | ICD-10-CM | POA: Diagnosis not present

## 2023-05-30 DIAGNOSIS — M9902 Segmental and somatic dysfunction of thoracic region: Secondary | ICD-10-CM | POA: Diagnosis not present

## 2023-05-30 DIAGNOSIS — S134XXA Sprain of ligaments of cervical spine, initial encounter: Secondary | ICD-10-CM | POA: Diagnosis not present

## 2023-05-31 DIAGNOSIS — M6281 Muscle weakness (generalized): Secondary | ICD-10-CM | POA: Diagnosis not present

## 2023-05-31 DIAGNOSIS — M25511 Pain in right shoulder: Secondary | ICD-10-CM | POA: Diagnosis not present

## 2023-06-01 ENCOUNTER — Other Ambulatory Visit: Payer: Self-pay

## 2023-06-01 ENCOUNTER — Other Ambulatory Visit (HOSPITAL_COMMUNITY): Payer: Self-pay

## 2023-06-04 ENCOUNTER — Other Ambulatory Visit: Payer: Self-pay

## 2023-06-04 ENCOUNTER — Other Ambulatory Visit (HOSPITAL_COMMUNITY): Payer: Self-pay

## 2023-06-04 DIAGNOSIS — S134XXA Sprain of ligaments of cervical spine, initial encounter: Secondary | ICD-10-CM | POA: Diagnosis not present

## 2023-06-04 DIAGNOSIS — M9903 Segmental and somatic dysfunction of lumbar region: Secondary | ICD-10-CM | POA: Diagnosis not present

## 2023-06-04 DIAGNOSIS — M9902 Segmental and somatic dysfunction of thoracic region: Secondary | ICD-10-CM | POA: Diagnosis not present

## 2023-06-04 DIAGNOSIS — M9901 Segmental and somatic dysfunction of cervical region: Secondary | ICD-10-CM | POA: Diagnosis not present

## 2023-06-04 DIAGNOSIS — S233XXA Sprain of ligaments of thoracic spine, initial encounter: Secondary | ICD-10-CM | POA: Diagnosis not present

## 2023-06-04 DIAGNOSIS — M47816 Spondylosis without myelopathy or radiculopathy, lumbar region: Secondary | ICD-10-CM | POA: Diagnosis not present

## 2023-06-04 MED ORDER — TIZANIDINE HCL 4 MG PO TABS
4.0000 mg | ORAL_TABLET | Freq: Three times a day (TID) | ORAL | 0 refills | Status: DC | PRN
  Filled 2023-06-04 – 2023-06-14 (×2): qty 270, 90d supply, fill #0

## 2023-06-05 ENCOUNTER — Encounter: Payer: Self-pay | Admitting: Pharmacist

## 2023-06-05 ENCOUNTER — Other Ambulatory Visit: Payer: Self-pay

## 2023-06-05 DIAGNOSIS — M25511 Pain in right shoulder: Secondary | ICD-10-CM | POA: Diagnosis not present

## 2023-06-05 DIAGNOSIS — M6281 Muscle weakness (generalized): Secondary | ICD-10-CM | POA: Diagnosis not present

## 2023-06-06 ENCOUNTER — Other Ambulatory Visit: Payer: Self-pay

## 2023-06-07 ENCOUNTER — Other Ambulatory Visit: Payer: Self-pay

## 2023-06-07 ENCOUNTER — Other Ambulatory Visit (HOSPITAL_COMMUNITY): Payer: Self-pay

## 2023-06-07 DIAGNOSIS — Z79899 Other long term (current) drug therapy: Secondary | ICD-10-CM | POA: Diagnosis not present

## 2023-06-07 DIAGNOSIS — E7800 Pure hypercholesterolemia, unspecified: Secondary | ICD-10-CM | POA: Diagnosis not present

## 2023-06-07 DIAGNOSIS — I10 Essential (primary) hypertension: Secondary | ICD-10-CM | POA: Diagnosis not present

## 2023-06-07 DIAGNOSIS — M25511 Pain in right shoulder: Secondary | ICD-10-CM | POA: Diagnosis not present

## 2023-06-07 DIAGNOSIS — R52 Pain, unspecified: Secondary | ICD-10-CM | POA: Diagnosis not present

## 2023-06-07 DIAGNOSIS — M069 Rheumatoid arthritis, unspecified: Secondary | ICD-10-CM | POA: Diagnosis not present

## 2023-06-07 DIAGNOSIS — Z299 Encounter for prophylactic measures, unspecified: Secondary | ICD-10-CM | POA: Diagnosis not present

## 2023-06-07 DIAGNOSIS — M6281 Muscle weakness (generalized): Secondary | ICD-10-CM | POA: Diagnosis not present

## 2023-06-07 DIAGNOSIS — Z0000 Encounter for general adult medical examination without abnormal findings: Secondary | ICD-10-CM | POA: Diagnosis not present

## 2023-06-07 MED ORDER — OMEPRAZOLE 20 MG PO CPDR
20.0000 mg | DELAYED_RELEASE_CAPSULE | Freq: Two times a day (BID) | ORAL | 4 refills | Status: DC
  Filled 2023-06-07: qty 180, 90d supply, fill #0

## 2023-06-07 MED ORDER — ERGOCALCIFEROL 1.25 MG (50000 UT) PO CAPS
1.0000 | ORAL_CAPSULE | ORAL | 4 refills | Status: DC
  Filled 2023-06-07: qty 12, 84d supply, fill #0

## 2023-06-07 MED ORDER — TIZANIDINE HCL 4 MG PO TABS
4.0000 mg | ORAL_TABLET | Freq: Three times a day (TID) | ORAL | 0 refills | Status: DC | PRN
  Filled 2023-06-07: qty 270, 90d supply, fill #0

## 2023-06-07 MED ORDER — METHOCARBAMOL 500 MG PO TABS
500.0000 mg | ORAL_TABLET | Freq: Two times a day (BID) | ORAL | 0 refills | Status: DC
  Filled 2023-06-07: qty 180, 90d supply, fill #0

## 2023-06-07 MED ORDER — GABAPENTIN 600 MG PO TABS
600.0000 mg | ORAL_TABLET | Freq: Three times a day (TID) | ORAL | 2 refills | Status: AC
  Filled 2023-06-07: qty 360, 120d supply, fill #0

## 2023-06-07 MED ORDER — SULFASALAZINE 500 MG PO TABS
1500.0000 mg | ORAL_TABLET | Freq: Two times a day (BID) | ORAL | 4 refills | Status: DC
  Filled 2023-06-07 – 2023-06-14 (×2): qty 540, 90d supply, fill #0

## 2023-06-07 MED ORDER — VALSARTAN-HYDROCHLOROTHIAZIDE 160-25 MG PO TABS
1.0000 | ORAL_TABLET | Freq: Every day | ORAL | 4 refills | Status: DC
  Filled 2023-06-07: qty 90, 90d supply, fill #0

## 2023-06-08 ENCOUNTER — Encounter: Payer: Self-pay | Admitting: Pharmacist

## 2023-06-08 ENCOUNTER — Other Ambulatory Visit: Payer: Self-pay

## 2023-06-08 ENCOUNTER — Ambulatory Visit
Admission: RE | Admit: 2023-06-08 | Discharge: 2023-06-08 | Disposition: A | Discharge status: Home / Self Care | Payer: HMO | Source: Ambulatory Visit | Attending: Internal Medicine | Admitting: Internal Medicine

## 2023-06-08 ENCOUNTER — Ambulatory Visit (INDEPENDENT_AMBULATORY_CARE_PROVIDER_SITE_OTHER): Payer: HMO | Admitting: Podiatry

## 2023-06-08 DIAGNOSIS — M7752 Other enthesopathy of left foot: Secondary | ICD-10-CM

## 2023-06-08 DIAGNOSIS — Z1231 Encounter for screening mammogram for malignant neoplasm of breast: Secondary | ICD-10-CM

## 2023-06-08 NOTE — Progress Notes (Signed)
Subjective:  Patient ID: Marie Farmer, female    DOB: 04-26-1956,  MRN: [id number]  Chief Complaint  Patient presents with   Foot Pain    67 y.o. female presents with the above complaint.  Patient presents with left ankle capsulitis with underlying arthritis.  Patient states painful to touch is progressive gotten worse hurts with ambulation for pressure patient is known to Dr. Loreta Ave.  She would like to know if she can do a steroid injection denies any other acute complaint she has a history of ankle surgery   Review of Systems: Negative except as noted in the HPI. Denies N/V/F/Ch.  Past Medical History:  Diagnosis Date   Anxiety    Asthma    DDD (degenerative disc disease)    chronic back pain   Fibromyalgia    Foot pain    GERD (gastroesophageal reflux disease)    Hypertension    IBS (irritable bowel syndrome)    Mood disorder (HCC)    PONV (postoperative nausea and vomiting)    Sleep apnea    Venous insufficiency    Wears glasses     Current Outpatient Medications:    methylPREDNISolone (MEDROL DOSEPAK) 4 MG TBPK tablet, Take as directed, Disp: 21 tablet, Rfl: 0   acetaminophen (TYLENOL) 500 MG tablet, Take 1,000 mg by mouth every 6 (six) hours as needed for moderate pain., Disp: , Rfl:    albuterol (VENTOLIN HFA) 108 (90 Base) MCG/ACT inhaler, Inhale 2 puffs into the lungs every 6 (six) hours as needed for wheezing or shortness of breath., Disp: , Rfl:    cephALEXin (KEFLEX) 500 MG capsule, Take 1 capsule (500 mg total) by mouth 3 (three) times daily., Disp: 9 capsule, Rfl: 0   ergocalciferol (VITAMIN D2) 1.25 MG (50000 UT) capsule, Take 1 capsule (50,000 Units total) by mouth once a week., Disp: 12 capsule, Rfl: 4   estradiol (ESTRACE) 0.1 MG/GM vaginal cream, Place 1 Applicatorful vaginally daily as needed (Menopause)., Disp: , Rfl:    fexofenadine (ALLEGRA) 180 MG tablet, Take 1 tablet (180 mg total) by mouth daily., Disp: 90 tablet, Rfl: 3   FLUoxetine (PROZAC) 20 MG  capsule, Take 20 mg by mouth daily., Disp: , Rfl:    gabapentin (NEURONTIN) 600 MG tablet, Take 600 mg by mouth 3 (three) times daily., Disp: , Rfl:    gabapentin (NEURONTIN) 600 MG tablet, Take 1 tablet (600 mg total) by mouth 3 (three) times daily., Disp: 360 tablet, Rfl: 2   gabapentin (NEURONTIN) 600 MG tablet, Take 1 tablet (600 mg total) by mouth 3 (three) times daily., Disp: 360 tablet, Rfl: 2   levothyroxine (SYNTHROID) 25 MCG tablet, Take 50 mcg by mouth daily before breakfast., Disp: , Rfl:    levothyroxine (SYNTHROID) 75 MCG tablet, Take 1 tablet (75 mcg total) by mouth every morning on an empty stomach., Disp: 90 tablet, Rfl: 5   methocarbamol (ROBAXIN) 500 MG tablet, Take 500 mg by mouth 2 (two) times daily., Disp: , Rfl:    methocarbamol (ROBAXIN) 500 MG tablet, Take 1 (one) tablet by mouth at 8am and 3pm for spinal stenosis, Disp: 180 tablet, Rfl: 0   methocarbamol (ROBAXIN) 500 MG tablet, Take 1 tablet (500 mg total) by mouth 2 (two) times daily (8am & 3pm), for spinal stenosis., Disp: 180 tablet, Rfl: 0   methylPREDNISolone (MEDROL DOSEPAK) 4 MG TBPK tablet, Take as directed, Disp: 21 tablet, Rfl: 0   montelukast (SINGULAIR) 10 MG tablet, Take 10 mg by mouth daily., Disp: ,  Rfl:    omeprazole (PRILOSEC) 20 MG capsule, Take 1 capsule (20 mg total) by mouth 2 (two) times daily., Disp: 180 capsule, Rfl: 4   omeprazole (PRILOSEC) 20 MG capsule, Take 1 capsule (20 mg total) by mouth 2 (two) times daily., Disp: 180 capsule, Rfl: 4   omeprazole (PRILOSEC) 40 MG capsule, Daily as needed., Disp: 90 capsule, Rfl: 3   oxyCODONE-acetaminophen (PERCOCET/ROXICET) 5-325 MG tablet, Take 1-2 tablets by mouth every 6 (six) hours as needed for severe pain., Disp: 20 tablet, Rfl: 0   promethazine (PHENERGAN) 25 MG tablet, Take 1 tablet (25 mg total) by mouth every 8 (eight) hours as needed for nausea or vomiting., Disp: 20 tablet, Rfl: 0   sulfaSALAzine (AZULFIDINE) 500 MG tablet, Take by mouth., Disp:  , Rfl:    sulfaSALAzine (AZULFIDINE) 500 MG tablet, Take 3 tablets (1,500 mg total) by mouth 2 (two) times daily., Disp: 540 tablet, Rfl: 4   tiZANidine (ZANAFLEX) 4 MG tablet, Take 4 mg by mouth 3 (three) times daily., Disp: , Rfl:    tiZANidine (ZANAFLEX) 4 MG tablet, Take 1 tablet (4 mg total) by mouth 3 (three) times daily as needed., Disp: 270 tablet, Rfl: 0   tiZANidine (ZANAFLEX) 4 MG tablet, Take 1 tablet (4 mg total) by mouth 3 (three) times daily as needed., Disp: 270 tablet, Rfl: 0   valsartan-hydrochlorothiazide (DIOVAN-HCT) 160-25 MG tablet, TAKE 1 TABLET BY MOUTH DAILY., Disp: 30 tablet, Rfl: 0   valsartan-hydrochlorothiazide (DIOVAN-HCT) 160-25 MG tablet, Take 1 tablet by mouth daily., Disp: 90 tablet, Rfl: 4   valsartan-hydrochlorothiazide (DIOVAN-HCT) 160-25 MG tablet, Take 1 tablet by mouth daily., Disp: 90 tablet, Rfl: 4   Vitamin D, Ergocalciferol, (DRISDOL) 1.25 MG (50000 UNIT) CAPS capsule, Take 50,000 Units by mouth every Sunday., Disp: , Rfl:    Vitamin D, Ergocalciferol, (DRISDOL) 1.25 MG (50000 UNIT) CAPS capsule, Take 1 capsule (50,000 Units total) by mouth once a week., Disp: 12 capsule, Rfl: 0  Social History   Tobacco Use  Smoking Status Never  Smokeless Tobacco Never    Allergies  Allergen Reactions   Ace Inhibitors Shortness Of Breath   Adhesive [Tape]     Peels skin   Cymbalta [Duloxetine Hcl]     Gums red, mouth felt hout   Latex     Sensitive skin, causes redness    Methylprednisolone     Redness all over   Prednisone Other (See Comments)    Redness all over   Azithromycin Rash   Nabumetone Rash and Other (See Comments)    lips swollen     Objective:  There were no vitals filed for this visit. There is no height or weight on file to calculate BMI. Constitutional Well developed. Well nourished.  Vascular Dorsalis pedis pulses palpable bilaterally. Posterior tibial pulses palpable bilaterally. Capillary refill normal to all digits.  No  cyanosis or clubbing noted. Pedal hair growth normal.  Neurologic Normal speech. Oriented to person, place, and time. Epicritic sensation to light touch grossly present bilaterally.  Dermatologic Nails well groomed and normal in appearance. No open wounds. No skin lesions.  Orthopedic: Pain on palpation left ankle pain with range of motion of the ankle joint pain at the lateral gutter.  No pain at the medial gutter.  No pain at the posterior tibial tendon peroneal tendon Achilles tendon   Radiographs: None Assessment:   1. Capsulitis of ankle, left    Plan:  Patient was evaluated and treated and all questions answered.  Left ankle capsulitis -All questions and concerns were discussed with the patient in extensive detail -Given the amount of pain that she is having she will benefit from a steroid injection of decrease inflammatory component to her pain.  Patient agrees with plan like to pursue steroid injection -A steroid injection was performed at left ankle using 1% plain Lidocaine and 10 mg of Kenalog. This was well tolerated.   No follow-ups on file.

## 2023-06-12 ENCOUNTER — Other Ambulatory Visit: Payer: Self-pay

## 2023-06-12 DIAGNOSIS — M6281 Muscle weakness (generalized): Secondary | ICD-10-CM | POA: Diagnosis not present

## 2023-06-12 DIAGNOSIS — M25511 Pain in right shoulder: Secondary | ICD-10-CM | POA: Diagnosis not present

## 2023-06-13 DIAGNOSIS — M47816 Spondylosis without myelopathy or radiculopathy, lumbar region: Secondary | ICD-10-CM | POA: Diagnosis not present

## 2023-06-13 DIAGNOSIS — S233XXA Sprain of ligaments of thoracic spine, initial encounter: Secondary | ICD-10-CM | POA: Diagnosis not present

## 2023-06-13 DIAGNOSIS — M9901 Segmental and somatic dysfunction of cervical region: Secondary | ICD-10-CM | POA: Diagnosis not present

## 2023-06-13 DIAGNOSIS — S134XXA Sprain of ligaments of cervical spine, initial encounter: Secondary | ICD-10-CM | POA: Diagnosis not present

## 2023-06-13 DIAGNOSIS — M9902 Segmental and somatic dysfunction of thoracic region: Secondary | ICD-10-CM | POA: Diagnosis not present

## 2023-06-13 DIAGNOSIS — M9903 Segmental and somatic dysfunction of lumbar region: Secondary | ICD-10-CM | POA: Diagnosis not present

## 2023-06-14 ENCOUNTER — Other Ambulatory Visit (HOSPITAL_COMMUNITY): Payer: Self-pay

## 2023-06-14 ENCOUNTER — Other Ambulatory Visit: Payer: Self-pay

## 2023-06-14 DIAGNOSIS — M6281 Muscle weakness (generalized): Secondary | ICD-10-CM | POA: Diagnosis not present

## 2023-06-14 DIAGNOSIS — M25511 Pain in right shoulder: Secondary | ICD-10-CM | POA: Diagnosis not present

## 2023-06-14 DIAGNOSIS — S46011A Strain of muscle(s) and tendon(s) of the rotator cuff of right shoulder, initial encounter: Secondary | ICD-10-CM | POA: Diagnosis not present

## 2023-06-14 DIAGNOSIS — M19011 Primary osteoarthritis, right shoulder: Secondary | ICD-10-CM | POA: Diagnosis not present

## 2023-06-14 DIAGNOSIS — M7581 Other shoulder lesions, right shoulder: Secondary | ICD-10-CM | POA: Diagnosis not present

## 2023-06-14 DIAGNOSIS — M6740 Ganglion, unspecified site: Secondary | ICD-10-CM | POA: Diagnosis not present

## 2023-06-19 DIAGNOSIS — M6281 Muscle weakness (generalized): Secondary | ICD-10-CM | POA: Diagnosis not present

## 2023-06-19 DIAGNOSIS — M25511 Pain in right shoulder: Secondary | ICD-10-CM | POA: Diagnosis not present

## 2023-06-21 DIAGNOSIS — M6281 Muscle weakness (generalized): Secondary | ICD-10-CM | POA: Diagnosis not present

## 2023-06-21 DIAGNOSIS — M25511 Pain in right shoulder: Secondary | ICD-10-CM | POA: Diagnosis not present

## 2023-07-02 DIAGNOSIS — M67911 Unspecified disorder of synovium and tendon, right shoulder: Secondary | ICD-10-CM | POA: Diagnosis not present

## 2023-07-02 DIAGNOSIS — M7541 Impingement syndrome of right shoulder: Secondary | ICD-10-CM | POA: Diagnosis not present

## 2023-07-02 DIAGNOSIS — M25511 Pain in right shoulder: Secondary | ICD-10-CM | POA: Diagnosis not present

## 2023-07-03 DIAGNOSIS — M25511 Pain in right shoulder: Secondary | ICD-10-CM | POA: Diagnosis not present

## 2023-07-03 DIAGNOSIS — M6281 Muscle weakness (generalized): Secondary | ICD-10-CM | POA: Diagnosis not present

## 2023-07-05 DIAGNOSIS — M25511 Pain in right shoulder: Secondary | ICD-10-CM | POA: Diagnosis not present

## 2023-07-05 DIAGNOSIS — M6281 Muscle weakness (generalized): Secondary | ICD-10-CM | POA: Diagnosis not present

## 2023-07-09 DIAGNOSIS — M6281 Muscle weakness (generalized): Secondary | ICD-10-CM | POA: Diagnosis not present

## 2023-07-09 DIAGNOSIS — M25511 Pain in right shoulder: Secondary | ICD-10-CM | POA: Diagnosis not present

## 2023-07-17 DIAGNOSIS — M25511 Pain in right shoulder: Secondary | ICD-10-CM | POA: Diagnosis not present

## 2023-07-17 DIAGNOSIS — M6281 Muscle weakness (generalized): Secondary | ICD-10-CM | POA: Diagnosis not present

## 2023-07-19 DIAGNOSIS — M6281 Muscle weakness (generalized): Secondary | ICD-10-CM | POA: Diagnosis not present

## 2023-07-19 DIAGNOSIS — M25511 Pain in right shoulder: Secondary | ICD-10-CM | POA: Diagnosis not present

## 2023-07-20 ENCOUNTER — Telehealth (INDEPENDENT_AMBULATORY_CARE_PROVIDER_SITE_OTHER): Payer: Self-pay | Admitting: *Deleted

## 2023-07-20 DIAGNOSIS — Z6836 Body mass index (BMI) 36.0-36.9, adult: Secondary | ICD-10-CM | POA: Diagnosis not present

## 2023-07-20 DIAGNOSIS — Z299 Encounter for prophylactic measures, unspecified: Secondary | ICD-10-CM | POA: Diagnosis not present

## 2023-07-20 DIAGNOSIS — M069 Rheumatoid arthritis, unspecified: Secondary | ICD-10-CM | POA: Diagnosis not present

## 2023-07-20 DIAGNOSIS — Z23 Encounter for immunization: Secondary | ICD-10-CM | POA: Diagnosis not present

## 2023-07-20 DIAGNOSIS — I10 Essential (primary) hypertension: Secondary | ICD-10-CM | POA: Diagnosis not present

## 2023-07-20 NOTE — Telephone Encounter (Signed)
Patient left message - she needs an apt please call [phone]

## 2023-07-23 ENCOUNTER — Encounter: Payer: Self-pay | Admitting: Gastroenterology

## 2023-07-23 ENCOUNTER — Ambulatory Visit (INDEPENDENT_AMBULATORY_CARE_PROVIDER_SITE_OTHER): Payer: HMO | Admitting: Gastroenterology

## 2023-07-23 VITALS — BP 138/82 | HR 59 | Temp 97.7°F | Ht 63.0 in | Wt 208.4 lb

## 2023-07-23 DIAGNOSIS — K625 Hemorrhage of anus and rectum: Secondary | ICD-10-CM

## 2023-07-23 DIAGNOSIS — K6289 Other specified diseases of anus and rectum: Secondary | ICD-10-CM | POA: Diagnosis not present

## 2023-07-23 DIAGNOSIS — K649 Unspecified hemorrhoids: Secondary | ICD-10-CM

## 2023-07-23 DIAGNOSIS — K648 Other hemorrhoids: Secondary | ICD-10-CM

## 2023-07-23 NOTE — Progress Notes (Signed)
GI Office Note    Referring Provider: Ignatius Specking, MD Primary Care Physician:  Ignatius Specking, MD Primary Gastroenterologist: Gerrit Friends.Rourk, MD  Date:  07/23/2023  ID:  Marie Farmer, DOB 12-May-1956, MRN [id number]   Chief Complaint   Chief Complaint  Patient presents with   Hemorrhoids    External hemorrhoids are painful. No bleeding    History of Present Illness  Marie Farmer is a 67 y.o. female with a history of anxiety, asthma, GERD, IBS, venous insufficiency, sleep apnea, and fibromyalgia presenting today with complaint of hemorrhoid pain.  Last colonoscopy October 2022: -Sigmoid diverticulosis -9 mm rectal polyp -Exam otherwise normal -Pathology revealed traditional serrated adenoma -Repeat in 5 years  Last office visit 07/20/2022.  She reported small amount of blood and pain with bowel movements and after a bowel movement.  Also with knifelike pain with bowel movements.  Prolapsing tissue but does not push it back in and sometimes having itching and burning.  Denied any straining.  Since cholecystectomy she reported stools have been looser and more urgent and not spending long periods of time committed.  Using Preparation H.  Given compounded cream with nitro and lidocaine for potential anal fissure and painful hemorrhoids.   Today: Tissue feels round and stinging after a bowel movement and lasting longer and longer each time. Having daily soft BM daily. Does not feel like she is straining and not spending long time on the commode. Has only seen a little blood with wiping. Wiping does not cause more pain. No constipation or diarrhea.   She reports the compound cream she has been using has not been helping, has been using that for a little while.   Does some squatting with 8-12lbs a couple times a week   Wt Readings from Last 3 Encounters:  07/23/23 208 lb 6.4 oz (94.5 kg)  07/20/22 208 lb (94.3 kg)  08/17/21 190 lb (86.2 kg)    Current Outpatient Medications   Medication Sig Dispense Refill   acetaminophen (TYLENOL) 500 MG tablet Take 1,000 mg by mouth every 6 (six) hours as needed for moderate pain.     albuterol (VENTOLIN HFA) 108 (90 Base) MCG/ACT inhaler Inhale 2 puffs into the lungs every 6 (six) hours as needed for wheezing or shortness of breath.     ergocalciferol (VITAMIN D2) 1.25 MG (50000 UT) capsule Take 1 capsule (50,000 Units total) by mouth once a week. 12 capsule 4   gabapentin (NEURONTIN) 600 MG tablet Take 1 tablet (600 mg total) by mouth 3 (three) times daily. 360 tablet 2   gabapentin (NEURONTIN) 600 MG tablet Take 1 tablet (600 mg total) by mouth 3 (three) times daily. 360 tablet 2   levothyroxine (SYNTHROID) 75 MCG tablet Take 1 tablet (75 mcg total) by mouth every morning on an empty stomach. 90 tablet 5   methocarbamol (ROBAXIN) 500 MG tablet Take 500 mg by mouth 2 (two) times daily.     methocarbamol (ROBAXIN) 500 MG tablet Take 1 (one) tablet by mouth at 8am and 3pm for spinal stenosis 180 tablet 0   methocarbamol (ROBAXIN) 500 MG tablet Take 1 tablet (500 mg total) by mouth 2 (two) times daily (8am & 3pm), for spinal stenosis. 180 tablet 0   methylPREDNISolone (MEDROL DOSEPAK) 4 MG TBPK tablet Take as directed 21 tablet 0   metoprolol tartrate (LOPRESSOR) 25 MG tablet Take 25 mg by mouth daily.     montelukast (SINGULAIR) 10 MG tablet Take 10 mg  by mouth daily.     omeprazole (PRILOSEC) 20 MG capsule Take 1 capsule (20 mg total) by mouth 2 (two) times daily. 180 capsule 4   omeprazole (PRILOSEC) 20 MG capsule Take 1 capsule (20 mg total) by mouth 2 (two) times daily. 180 capsule 4   sulfaSALAzine (AZULFIDINE) 500 MG tablet Take by mouth.     sulfaSALAzine (AZULFIDINE) 500 MG tablet Take 3 tablets (1,500 mg total) by mouth 2 (two) times daily. 540 tablet 4   tiZANidine (ZANAFLEX) 4 MG tablet Take 1 tablet (4 mg total) by mouth 3 (three) times daily as needed. 270 tablet 0   tiZANidine (ZANAFLEX) 4 MG tablet Take 1 tablet (4 mg  total) by mouth 3 (three) times daily as needed. 270 tablet 0   valsartan-hydrochlorothiazide (DIOVAN-HCT) 160-25 MG tablet Take 1 tablet by mouth daily. 90 tablet 4   valsartan-hydrochlorothiazide (DIOVAN-HCT) 160-25 MG tablet Take 1 tablet by mouth daily. 90 tablet 4   Vitamin D, Ergocalciferol, (DRISDOL) 1.25 MG (50000 UNIT) CAPS capsule Take 1 capsule (50,000 Units total) by mouth once a week. 12 capsule 0   estradiol (ESTRACE) 0.1 MG/GM vaginal cream Place 1 Applicatorful vaginally daily as needed (Menopause). (Patient not taking: Reported on 07/23/2023)     fexofenadine (ALLEGRA) 180 MG tablet Take 1 tablet (180 mg total) by mouth daily. (Patient not taking: Reported on 07/23/2023) 90 tablet 3   FLUoxetine (PROZAC) 20 MG capsule Take 20 mg by mouth daily. (Patient not taking: Reported on 07/23/2023)     No current facility-administered medications for this visit.    Past Medical History:  Diagnosis Date   Anxiety    Asthma    DDD (degenerative disc disease)    chronic back pain   Fibromyalgia    Foot pain    GERD (gastroesophageal reflux disease)    Hypertension    IBS (irritable bowel syndrome)    Mood disorder (HCC)    PONV (postoperative nausea and vomiting)    Sleep apnea    Venous insufficiency    Wears glasses     Past Surgical History:  Procedure Laterality Date   CARPAL TUNNEL RELEASE Right 06/10/2013   Procedure: CARPAL TUNNEL RELEASE;  Surgeon: Nicki Reaper, MD;  Location: [facility];  Service: Orthopedics;  Laterality: Right;   CHOLECYSTECTOMY N/A 08/19/2021   Procedure: LAPAROSCOPIC CHOLECYSTECTOMY;  Surgeon: Lucretia Roers, MD;  Location: [facility];  Service: General;  Laterality: N/A;   COLONOSCOPY  12/20/2011   [name]-single diminutive sessile polyp in the sigmoid colon/small interenal hemorrhoids   COLONOSCOPY N/A 11/28/2017   Procedure: COLONOSCOPY;  Surgeon: Corbin Ade, MD;  Location: [facility];  Service: Endoscopy;  Laterality:  N/A;  10:30   COLONOSCOPY WITH PROPOFOL N/A 07/07/2021   one 9 mm polyp in rectum s/p removal (serrated adenoma). surveillance in 5 years   ESOPHAGOGASTRODUODENOSCOPY  04/10/2003   Normal esophagus/Antral erosions, as described above.  The remainder of the stomach appeared normal.  Normal first and second parts of the duodenum.   ESOPHAGOGASTRODUODENOSCOPY  12/20/2011   [name]-hiatus hernia/chronic gastritis/normal duodenumNEGATIVE H pylori, negative SB biopsy   POLYPECTOMY  07/07/2021   Procedure: POLYPECTOMY;  Surgeon: Corbin Ade, MD;  Location: [facility];  Service: Endoscopy;;   TIBIA FRACTURE SURGERY  09/11/2010   right    Family History  Problem Relation Age of Onset   Diabetes Mother    Coronary artery disease Mother    COPD Mother    Colon  polyps Mother        age 70   Lung cancer Father    Breast cancer Neg Hx     Allergies as of 07/23/2023 - Review Complete 07/23/2023  Allergen Reaction Noted   Ace inhibitors Shortness Of Breath 12/06/2016   Adhesive [tape]  05/12/2011   Cymbalta [duloxetine hcl]  06/04/2013   Latex  05/12/2011   Methylprednisolone  05/16/2017   Prednisone Other (See Comments) 05/16/2017   Azithromycin Rash 02/29/2012   Nabumetone Rash and Other (See Comments) 08/06/2018    Social History   Socioeconomic History   Marital status: Divorced    Spouse name: Not on file   Number of children: 2   Years of education: Not on file   Highest education level: Not on file  Occupational History   Occupation: unemployed  Tobacco Use   Smoking status: Never   Smokeless tobacco: Never  Substance and Sexual Activity   Alcohol use: Yes    Comment: socially, couple times per week   Drug use: No    Types: Marijuana    Comment: remote maijuana   Sexual activity: Never    Birth control/protection: None  Other Topics Concern   Not on file  Social History Narrative   Lives w/ sister & her husband & daughter   Social Determinants of  Health   Financial Resource Strain: Not on file  Food Insecurity: Not on file  Transportation Needs: Not on file  Physical Activity: Not on file  Stress: Not on file  Social Connections: Not on file     Review of Systems   Gen: Denies fever, chills, anorexia. Denies fatigue, weakness, weight loss.  CV: Denies chest pain, palpitations, syncope, peripheral edema, and claudication. Resp: Denies dyspnea at rest, cough, wheezing, coughing up blood, and pleurisy. GI: See HPI Derm: Denies rash, itching, dry skin Psych: Denies depression, anxiety, memory loss, confusion. No homicidal or suicidal ideation.  Heme: Denies bruising, bleeding, and enlarged lymph nodes.  Physical Exam   BP 138/82 (BP Location: Right Arm, Patient Position: Sitting, Cuff Size: Large)   Pulse (!) 59   Temp 97.7 F (36.5 C) (Temporal)   Ht 5\' 3"  (1.6 m)   Wt 208 lb 6.4 oz (94.5 kg)   LMP 05/12/2007   BMI 36.92 kg/m   General:   Alert and oriented. No distress noted. Pleasant and cooperative.  Head:  Normocephalic and atraumatic. Eyes:  Conjuctiva clear without scleral icterus. Rectal:  small external tag noted to 6 o'clock position. DRE without pain and no mass. No fissure appreciated. Hemorrhoid tissue noted to right posterior and left lateral.  Msk:  Symmetrical without gross deformities. Normal posture. Extremities:  Without edema. Neurologic:  Alert and  oriented x4 Psych:  Alert and cooperative. Normal mood and affect.  Assessment  Marie Farmer is a 67 y.o. female with a history of anxiety, asthma, GERD, IBS, venous insufficiency, sleep apnea, and fibromyalgia presenting today with complaint of hemorrhoid pain.   Hemorrhoids, rectal pain: Having intermittent rectal pain and occasional toilet tissue hematochezia.  Unlikely that this is a recurrent anal fissure however she has had this in the past and has pain with bowel movements still at times therefore I will change up her compounded hemorrhoid  cream from nitro to nifedipine and we will follow-up in 4 to 6 weeks.  We discussed potential for hemorrhoid banding given recurrent issues with pain.  PLAN   Compounded hemorrhoid cream with nifedipine 0.2% and lidocaine 5% to  [facility] 3-4 times daily.  Hemorrhoid banding pamphlet provided Follow up in 4-6 weeks     Brooke Bonito, MSN, FNP-BC, AGACNP-BC [facility]

## 2023-07-23 NOTE — Patient Instructions (Signed)
I will call in a different compounded hemorrhoid cream to [facility] for you.  This will contain nifedipine instead of nitroglycerin which you are using currently.  Please look over the hemorrhoid banding pamphlet provided to you today, this is what we briefly discussed.  If you would like to schedule this after reviewing the information more you can call anytime to make an appointment with myself, Dr. Jena Gauss, or Lewie Loron.  Follow up in 4-6 weeks.   It was a pleasure to see you today. I want to create trusting relationships with patients. If you receive a survey regarding your visit,  I greatly appreciate you taking time to fill this out on paper or through your MyChart. I value your feedback.  Brooke Bonito, MSN, FNP-BC, AGACNP-BC [facility]

## 2023-07-24 DIAGNOSIS — M25511 Pain in right shoulder: Secondary | ICD-10-CM | POA: Diagnosis not present

## 2023-07-24 DIAGNOSIS — M6281 Muscle weakness (generalized): Secondary | ICD-10-CM | POA: Diagnosis not present

## 2023-07-26 DIAGNOSIS — M6281 Muscle weakness (generalized): Secondary | ICD-10-CM | POA: Diagnosis not present

## 2023-07-26 DIAGNOSIS — M25511 Pain in right shoulder: Secondary | ICD-10-CM | POA: Diagnosis not present

## 2023-07-31 DIAGNOSIS — M6281 Muscle weakness (generalized): Secondary | ICD-10-CM | POA: Diagnosis not present

## 2023-07-31 DIAGNOSIS — M25511 Pain in right shoulder: Secondary | ICD-10-CM | POA: Diagnosis not present

## 2023-08-01 ENCOUNTER — Encounter: Payer: Self-pay | Admitting: Podiatry

## 2023-08-01 ENCOUNTER — Ambulatory Visit (INDEPENDENT_AMBULATORY_CARE_PROVIDER_SITE_OTHER): Payer: HMO | Admitting: Podiatry

## 2023-08-01 DIAGNOSIS — M7732 Calcaneal spur, left foot: Secondary | ICD-10-CM | POA: Diagnosis not present

## 2023-08-01 DIAGNOSIS — M722 Plantar fascial fibromatosis: Secondary | ICD-10-CM | POA: Diagnosis not present

## 2023-08-01 NOTE — Progress Notes (Signed)
Subjective:  Patient ID: Marie Farmer, female    DOB: Nov 27, 1955,  MRN: [id number]  Chief Complaint  Patient presents with   Foot Pain    RM#14 Left ankle pain would like to discuss treatment has had injection and has a brace .    67 y.o. female presents with the above complaint.  Patient presents with new complaint on left heel pain that has been there for quite some time.  Patient states that it is causing her a lot of pain hurts with ambulation worse with pressure she has not seen MRIs prior to seeing me.  She has had injection in the ankle in the past.  This is more on the heel pain scale 7 out of 10 dull aching nature   Review of Systems: Negative except as noted in the HPI. Denies N/V/F/Ch.  Past Medical History:  Diagnosis Date   Anxiety    Asthma    DDD (degenerative disc disease)    chronic back pain   Fibromyalgia    Foot pain    GERD (gastroesophageal reflux disease)    Hypertension    IBS (irritable bowel syndrome)    Mood disorder (HCC)    PONV (postoperative nausea and vomiting)    Sleep apnea    Venous insufficiency    Wears glasses     Current Outpatient Medications:    acetaminophen (TYLENOL) 500 MG tablet, Take 1,000 mg by mouth every 6 (six) hours as needed for moderate pain., Disp: , Rfl:    albuterol (VENTOLIN HFA) 108 (90 Base) MCG/ACT inhaler, Inhale 2 puffs into the lungs every 6 (six) hours as needed for wheezing or shortness of breath., Disp: , Rfl:    ergocalciferol (VITAMIN D2) 1.25 MG (50000 UT) capsule, Take 1 capsule (50,000 Units total) by mouth once a week., Disp: 12 capsule, Rfl: 4   estradiol (ESTRACE) 0.1 MG/GM vaginal cream, Place 1 Applicatorful vaginally daily as needed (Menopause)., Disp: , Rfl:    fexofenadine (ALLEGRA) 180 MG tablet, Take 1 tablet (180 mg total) by mouth daily., Disp: 90 tablet, Rfl: 3   FLUoxetine (PROZAC) 20 MG capsule, Take 20 mg by mouth daily., Disp: , Rfl:    gabapentin (NEURONTIN) 600 MG tablet, Take 1 tablet  (600 mg total) by mouth 3 (three) times daily., Disp: 360 tablet, Rfl: 2   gabapentin (NEURONTIN) 600 MG tablet, Take 1 tablet (600 mg total) by mouth 3 (three) times daily., Disp: 360 tablet, Rfl: 2   levothyroxine (SYNTHROID) 75 MCG tablet, Take 1 tablet (75 mcg total) by mouth every morning on an empty stomach., Disp: 90 tablet, Rfl: 5   methocarbamol (ROBAXIN) 500 MG tablet, Take 500 mg by mouth 2 (two) times daily., Disp: , Rfl:    methocarbamol (ROBAXIN) 500 MG tablet, Take 1 (one) tablet by mouth at 8am and 3pm for spinal stenosis, Disp: 180 tablet, Rfl: 0   methocarbamol (ROBAXIN) 500 MG tablet, Take 1 tablet (500 mg total) by mouth 2 (two) times daily (8am & 3pm), for spinal stenosis., Disp: 180 tablet, Rfl: 0   methylPREDNISolone (MEDROL DOSEPAK) 4 MG TBPK tablet, Take as directed, Disp: 21 tablet, Rfl: 0   metoprolol tartrate (LOPRESSOR) 25 MG tablet, Take 25 mg by mouth daily., Disp: , Rfl:    montelukast (SINGULAIR) 10 MG tablet, Take 10 mg by mouth daily., Disp: , Rfl:    omeprazole (PRILOSEC) 20 MG capsule, Take 1 capsule (20 mg total) by mouth 2 (two) times daily., Disp: 180 capsule, Rfl:  4   omeprazole (PRILOSEC) 20 MG capsule, Take 1 capsule (20 mg total) by mouth 2 (two) times daily., Disp: 180 capsule, Rfl: 4   sulfaSALAzine (AZULFIDINE) 500 MG tablet, Take by mouth., Disp: , Rfl:    sulfaSALAzine (AZULFIDINE) 500 MG tablet, Take 3 tablets (1,500 mg total) by mouth 2 (two) times daily., Disp: 540 tablet, Rfl: 4   tiZANidine (ZANAFLEX) 4 MG tablet, Take 1 tablet (4 mg total) by mouth 3 (three) times daily as needed., Disp: 270 tablet, Rfl: 0   tiZANidine (ZANAFLEX) 4 MG tablet, Take 1 tablet (4 mg total) by mouth 3 (three) times daily as needed., Disp: 270 tablet, Rfl: 0   valsartan-hydrochlorothiazide (DIOVAN-HCT) 160-25 MG tablet, Take 1 tablet by mouth daily., Disp: 90 tablet, Rfl: 4   valsartan-hydrochlorothiazide (DIOVAN-HCT) 160-25 MG tablet, Take 1 tablet by mouth daily.,  Disp: 90 tablet, Rfl: 4   Vitamin D, Ergocalciferol, (DRISDOL) 1.25 MG (50000 UNIT) CAPS capsule, Take 1 capsule (50,000 Units total) by mouth once a week., Disp: 12 capsule, Rfl: 0  Social History   Tobacco Use  Smoking Status Never  Smokeless Tobacco Never    Allergies  Allergen Reactions   Ace Inhibitors Shortness Of Breath   Adhesive [Tape]     Peels skin   Cymbalta [Duloxetine Hcl]     Gums red, mouth felt hout   Latex     Sensitive skin, causes redness    Methylprednisolone     Redness all over   Prednisone Other (See Comments)    Redness all over   Azithromycin Rash   Nabumetone Rash and Other (See Comments)    lips swollen     Objective:  There were no vitals filed for this visit. There is no height or weight on file to calculate BMI. Constitutional Well developed. Well nourished.  Vascular Dorsalis pedis pulses palpable bilaterally. Posterior tibial pulses palpable bilaterally. Capillary refill normal to all digits.  No cyanosis or clubbing noted. Pedal hair growth normal.  Neurologic Normal speech. Oriented to person, place, and time. Epicritic sensation to light touch grossly present bilaterally.  Dermatologic Nails well groomed and normal in appearance. No open wounds. No skin lesions.  Orthopedic: Normal joint ROM without pain or crepitus bilaterally. No visible deformities. Tender to palpation at the calcaneal tuber left. No pain with calcaneal squeeze left. Ankle ROM diminished range of motion left. Silfverskiold Test: positive left.   Radiographs: 3 views of skeletally mature adult private ankleX-rays were reviewed.  Plantar heel spurring noted pes cavus foot structure noted previous hardware noted appears to be in good alignment.  Assessment:   1. Plantar fasciitis, left   2. Heel spur, left    Plan:  Patient was evaluated and treated and all questions answered.  Plantar Fasciitis, left with underlying heel spur - XR reviewed as above.   - Educated on icing and stretching. Instructions given.  - Injection delivered to the plantar fascia as below. - DME: Plantar fascial brace dispensed to support the medial longitudinal arch of the foot and offload pressure from the heel and prevent arch collapse during weightbearing - Pharmacologic management: None  Procedure: Injection Tendon/Ligament Location: Left plantar fascia at the glabrous junction; medial approach. Skin Prep: alcohol Injectate: 0.5 cc 0.5% marcaine plain, 0.5 cc of 1% Lidocaine, 0.5 cc kenalog 10. Disposition: Patient tolerated procedure well. Injection site dressed with a band-aid.  No follow-ups on file.

## 2023-08-02 DIAGNOSIS — Z6836 Body mass index (BMI) 36.0-36.9, adult: Secondary | ICD-10-CM | POA: Diagnosis not present

## 2023-08-02 DIAGNOSIS — I10 Essential (primary) hypertension: Secondary | ICD-10-CM | POA: Diagnosis not present

## 2023-08-02 DIAGNOSIS — M6281 Muscle weakness (generalized): Secondary | ICD-10-CM | POA: Diagnosis not present

## 2023-08-02 DIAGNOSIS — R5383 Other fatigue: Secondary | ICD-10-CM | POA: Diagnosis not present

## 2023-08-02 DIAGNOSIS — M25511 Pain in right shoulder: Secondary | ICD-10-CM | POA: Diagnosis not present

## 2023-08-02 DIAGNOSIS — Z299 Encounter for prophylactic measures, unspecified: Secondary | ICD-10-CM | POA: Diagnosis not present

## 2023-08-06 DIAGNOSIS — M6281 Muscle weakness (generalized): Secondary | ICD-10-CM | POA: Diagnosis not present

## 2023-08-06 DIAGNOSIS — M25511 Pain in right shoulder: Secondary | ICD-10-CM | POA: Diagnosis not present

## 2023-08-08 DIAGNOSIS — M25511 Pain in right shoulder: Secondary | ICD-10-CM | POA: Diagnosis not present

## 2023-08-08 DIAGNOSIS — M6281 Muscle weakness (generalized): Secondary | ICD-10-CM | POA: Diagnosis not present

## 2023-08-13 DIAGNOSIS — M25511 Pain in right shoulder: Secondary | ICD-10-CM | POA: Diagnosis not present

## 2023-08-13 DIAGNOSIS — M7541 Impingement syndrome of right shoulder: Secondary | ICD-10-CM | POA: Diagnosis not present

## 2023-08-14 ENCOUNTER — Other Ambulatory Visit (HOSPITAL_COMMUNITY): Payer: Self-pay

## 2023-08-17 DIAGNOSIS — Z6836 Body mass index (BMI) 36.0-36.9, adult: Secondary | ICD-10-CM | POA: Diagnosis not present

## 2023-08-17 DIAGNOSIS — Z299 Encounter for prophylactic measures, unspecified: Secondary | ICD-10-CM | POA: Diagnosis not present

## 2023-08-17 DIAGNOSIS — I10 Essential (primary) hypertension: Secondary | ICD-10-CM | POA: Diagnosis not present

## 2023-08-17 DIAGNOSIS — R52 Pain, unspecified: Secondary | ICD-10-CM | POA: Diagnosis not present

## 2023-08-17 DIAGNOSIS — M25572 Pain in left ankle and joints of left foot: Secondary | ICD-10-CM | POA: Diagnosis not present

## 2023-08-20 ENCOUNTER — Ambulatory Visit: Payer: HMO | Admitting: Gastroenterology

## 2023-08-24 ENCOUNTER — Encounter: Payer: Self-pay | Admitting: Podiatry

## 2023-08-24 ENCOUNTER — Ambulatory Visit (INDEPENDENT_AMBULATORY_CARE_PROVIDER_SITE_OTHER): Payer: HMO | Admitting: Podiatry

## 2023-08-24 DIAGNOSIS — M7732 Calcaneal spur, left foot: Secondary | ICD-10-CM | POA: Diagnosis not present

## 2023-08-24 DIAGNOSIS — M722 Plantar fascial fibromatosis: Secondary | ICD-10-CM | POA: Diagnosis not present

## 2023-08-24 NOTE — Progress Notes (Signed)
Subjective:  Patient ID: Marie Farmer, female    DOB: 01/19/1956,  MRN: [id number]  Chief Complaint  Patient presents with   Foot Pain    Patient states she has ankle pain in her left ankle , patient says that after the shot that she was given in last visit , it is still not good. Patient will like to see what doctor can do about it. She will like to know what she is dealing with    67 y.o. female presents with the above complaint.  Patient presents with left plantar fasciitis.  Patient states that she is doing a little bit better the pain has improved.  She still has some residual pain she is getting ready to leave for [place].  Denies any other acute issues would like to discuss next treatment plan   Review of Systems: Negative except as noted in the HPI. Denies N/V/F/Ch.  Past Medical History:  Diagnosis Date   Anxiety    Asthma    DDD (degenerative disc disease)    chronic back pain   Fibromyalgia    Foot pain    GERD (gastroesophageal reflux disease)    Hypertension    IBS (irritable bowel syndrome)    Mood disorder (HCC)    PONV (postoperative nausea and vomiting)    Sleep apnea    Venous insufficiency    Wears glasses     Current Outpatient Medications:    acetaminophen (TYLENOL) 500 MG tablet, Take 1,000 mg by mouth every 6 (six) hours as needed for moderate pain., Disp: , Rfl:    albuterol (VENTOLIN HFA) 108 (90 Base) MCG/ACT inhaler, Inhale 2 puffs into the lungs every 6 (six) hours as needed for wheezing or shortness of breath., Disp: , Rfl:    ergocalciferol (VITAMIN D2) 1.25 MG (50000 UT) capsule, Take 1 capsule (50,000 Units total) by mouth once a week., Disp: 12 capsule, Rfl: 4   estradiol (ESTRACE) 0.1 MG/GM vaginal cream, Place 1 Applicatorful vaginally daily as needed (Menopause)., Disp: , Rfl:    fexofenadine (ALLEGRA) 180 MG tablet, Take 1 tablet (180 mg total) by mouth daily., Disp: 90 tablet, Rfl: 3   FLUoxetine (PROZAC) 20 MG capsule, Take 20 mg by mouth  daily., Disp: , Rfl:    gabapentin (NEURONTIN) 600 MG tablet, Take 1 tablet (600 mg total) by mouth 3 (three) times daily., Disp: 360 tablet, Rfl: 2   gabapentin (NEURONTIN) 600 MG tablet, Take 1 tablet (600 mg total) by mouth 3 (three) times daily., Disp: 360 tablet, Rfl: 2   levothyroxine (SYNTHROID) 75 MCG tablet, Take 1 tablet (75 mcg total) by mouth every morning on an empty stomach., Disp: 90 tablet, Rfl: 5   methocarbamol (ROBAXIN) 500 MG tablet, Take 500 mg by mouth 2 (two) times daily., Disp: , Rfl:    methocarbamol (ROBAXIN) 500 MG tablet, Take 1 (one) tablet by mouth at 8am and 3pm for spinal stenosis, Disp: 180 tablet, Rfl: 0   methocarbamol (ROBAXIN) 500 MG tablet, Take 1 tablet (500 mg total) by mouth 2 (two) times daily (8am & 3pm), for spinal stenosis., Disp: 180 tablet, Rfl: 0   methylPREDNISolone (MEDROL DOSEPAK) 4 MG TBPK tablet, Take as directed, Disp: 21 tablet, Rfl: 0   metoprolol tartrate (LOPRESSOR) 25 MG tablet, Take 25 mg by mouth daily., Disp: , Rfl:    montelukast (SINGULAIR) 10 MG tablet, Take 10 mg by mouth daily., Disp: , Rfl:    omeprazole (PRILOSEC) 20 MG capsule, Take 1 capsule (20  mg total) by mouth 2 (two) times daily., Disp: 180 capsule, Rfl: 4   omeprazole (PRILOSEC) 20 MG capsule, Take 1 capsule (20 mg total) by mouth 2 (two) times daily., Disp: 180 capsule, Rfl: 4   sulfaSALAzine (AZULFIDINE) 500 MG tablet, Take by mouth., Disp: , Rfl:    sulfaSALAzine (AZULFIDINE) 500 MG tablet, Take 3 tablets (1,500 mg total) by mouth 2 (two) times daily., Disp: 540 tablet, Rfl: 4   tiZANidine (ZANAFLEX) 4 MG tablet, Take 1 tablet (4 mg total) by mouth 3 (three) times daily as needed., Disp: 270 tablet, Rfl: 0   tiZANidine (ZANAFLEX) 4 MG tablet, Take 1 tablet (4 mg total) by mouth 3 (three) times daily as needed., Disp: 270 tablet, Rfl: 0   valsartan-hydrochlorothiazide (DIOVAN-HCT) 160-25 MG tablet, Take 1 tablet by mouth daily., Disp: 90 tablet, Rfl: 4    valsartan-hydrochlorothiazide (DIOVAN-HCT) 160-25 MG tablet, Take 1 tablet by mouth daily., Disp: 90 tablet, Rfl: 4   Vitamin D, Ergocalciferol, (DRISDOL) 1.25 MG (50000 UNIT) CAPS capsule, Take 1 capsule (50,000 Units total) by mouth once a week., Disp: 12 capsule, Rfl: 0  Social History   Tobacco Use  Smoking Status Never  Smokeless Tobacco Never    Allergies  Allergen Reactions   Ace Inhibitors Shortness Of Breath   Adhesive [Tape]     Peels skin   Cymbalta [Duloxetine Hcl]     Gums red, mouth felt hout   Latex     Sensitive skin, causes redness    Methylprednisolone     Redness all over   Prednisone Other (See Comments)    Redness all over   Azithromycin Rash   Nabumetone Rash and Other (See Comments)    lips swollen     Objective:  There were no vitals filed for this visit. There is no height or weight on file to calculate BMI. Constitutional Well developed. Well nourished.  Vascular Dorsalis pedis pulses palpable bilaterally. Posterior tibial pulses palpable bilaterally. Capillary refill normal to all digits.  No cyanosis or clubbing noted. Pedal hair growth normal.  Neurologic Normal speech. Oriented to person, place, and time. Epicritic sensation to light touch grossly present bilaterally.  Dermatologic Nails well groomed and normal in appearance. No open wounds. No skin lesions.  Orthopedic: Normal joint ROM without pain or crepitus bilaterally. No visible deformities. Tender to palpation at the calcaneal tuber left. No pain with calcaneal squeeze left. Ankle ROM diminished range of motion left. Silfverskiold Test: positive left.   Radiographs: 3 views of skeletally mature adult private ankleX-rays were reviewed.  Plantar heel spurring noted pes cavus foot structure noted previous hardware noted appears to be in good alignment.  Assessment:   No diagnosis found.  Plan:  Patient was evaluated and treated and all questions answered.  Plantar  Fasciitis, left with underlying heel spur - XR reviewed as above.  - Educated on icing and stretching. Instructions given.  - second injection delivered to the plantar fascia as below. - DME: Plantar fascial brace dispensed to support the medial longitudinal arch of the foot and offload pressure from the heel and prevent arch collapse during weightbearing - Pharmacologic management: None  Procedure: Injection Tendon/Ligament Location: Left plantar fascia at the glabrous junction; medial approach. Skin Prep: alcohol Injectate: 0.5 cc 0.5% marcaine plain, 0.5 cc of 1% Lidocaine, 0.5 cc kenalog 10. Disposition: Patient tolerated procedure well. Injection site dressed with a band-aid.  No follow-ups on file.

## 2023-08-27 DIAGNOSIS — L570 Actinic keratosis: Secondary | ICD-10-CM | POA: Diagnosis not present

## 2023-08-30 ENCOUNTER — Telehealth: Payer: Self-pay

## 2023-08-30 NOTE — Telephone Encounter (Signed)
Patient called and left a message. Her last 3 or so appointments have been with Dr. Allena Katz for injections. Her foot is not getting better, and in fact feels like her ankle is worse. Your first available is late January. She really wants to be seen sooner, but only wants to see you

## 2023-08-31 NOTE — Telephone Encounter (Signed)
Patient called again and left a message stating that the surgery did not help her foot and wants to speak with Dr. Ardelle Anton. I informed patient of Dr. Gabriel Rung response and would like to schedule appointment.

## 2023-09-07 ENCOUNTER — Ambulatory Visit (INDEPENDENT_AMBULATORY_CARE_PROVIDER_SITE_OTHER): Payer: HMO | Admitting: Podiatry

## 2023-09-07 ENCOUNTER — Encounter: Payer: Self-pay | Admitting: Podiatry

## 2023-09-07 DIAGNOSIS — M792 Neuralgia and neuritis, unspecified: Secondary | ICD-10-CM | POA: Diagnosis not present

## 2023-09-07 DIAGNOSIS — M7750 Other enthesopathy of unspecified foot: Secondary | ICD-10-CM | POA: Diagnosis not present

## 2023-09-07 NOTE — Progress Notes (Unsigned)
Subjective: Chief Complaint  Patient presents with   Ankle Pain    RM#11 Left ankle pain still keeping her up at night wearing brace and cam boot. No relief from prior injections either.   67 year old female presents the office with above concerns.  She has been followed Dr. Allena Katz for the last couple of appointments and she has had steroid injections for the plantar fascia.  She said this helped that but she states that she still having pain now.  She is also getting some nerve type symptoms going up her leg.  She is already taking gabapentin 3 mg the morning 300 mg at nighttime she states.  No recent injury or changes otherwise.  Objective: AAO x3, NAD DP/PT pulses palpable bilaterally, CRT less than 3 seconds Patient is palpation on the anterior ankle joint but mostly more along the flexor tendons.  She is also starting her symptoms coming up the leg.  She is a negative Tinel sign today.  There is no area pinpoint tenderness.  There is no edema is no erythema.  MMT 5/5. No pain with calf compression, swelling, warmth, erythema  Assessment: 67 year old female with left chronic ankle pain, neuritis symptoms  Plan: -All treatment options discussed with the patient including all alternatives, risks, complications.  -Given ongoing nature of her symptoms I like to further evaluate other pathologies including nerve issues. Will check a NCV.  This is within normal limits check new MRI. -She can continue the CAM boot for now since it does help.  -Patient encouraged to call the office with any questions, concerns, change in symptoms.   Marie Farmer DPM

## 2023-09-13 ENCOUNTER — Telehealth: Payer: Self-pay

## 2023-09-13 NOTE — Telephone Encounter (Signed)
-----   Message from Vivi Barrack sent at 09/10/2023  8:18 AM EST ----- Can you please send NCV order to [facility], Dr. Marta Lamas? Kathleen Argue

## 2023-09-13 NOTE — Telephone Encounter (Signed)
 Referral, office notes and demographics faxed to Dr. Bufford Buttner @ [facility] for NCV Phone [phone] Fax [phone]  Confirmation received

## 2023-09-18 ENCOUNTER — Telehealth: Payer: Self-pay | Admitting: Podiatry

## 2023-09-18 NOTE — Telephone Encounter (Signed)
 Pt called stating she has called a couple of times and was to have been sent for nerve studies and has not heard anything.  I checked chart and seen they were faxed to [facility] on 1/2.  Pt stated last time she tried going there for something else  it took them a long time ( like 6 wks)  and she ended up going somewhere else.   I transferred to the nurse

## 2023-09-18 NOTE — Telephone Encounter (Signed)
 I called and spoke with Amy at [facility] to confirm that the referral was received. She stated that they had just received approval form the insurance company today. She states they should be calling to schedule appointment with patient in the next few days.   I called patient ,but no answer. A setailed message was left with all thie above information.Also informed patient if any further questions or concerns she could give our office a call or call [facility] (numbers given).

## 2023-09-21 ENCOUNTER — Ambulatory Visit: Payer: HMO | Admitting: Podiatry

## 2023-09-25 ENCOUNTER — Telehealth: Payer: Self-pay

## 2023-09-25 ENCOUNTER — Other Ambulatory Visit: Payer: Self-pay | Admitting: Podiatry

## 2023-09-25 DIAGNOSIS — M899 Disorder of bone, unspecified: Secondary | ICD-10-CM

## 2023-09-25 DIAGNOSIS — M7750 Other enthesopathy of unspecified foot: Secondary | ICD-10-CM

## 2023-09-25 NOTE — Telephone Encounter (Signed)
 Patient called and left a message - she cannot get scheduled with [facility], she has called many times but cannot get a person on the phone. She is frustrated. She is asking if you will order an MRI on her foot

## 2023-10-02 DIAGNOSIS — M5417 Radiculopathy, lumbosacral region: Secondary | ICD-10-CM | POA: Diagnosis not present

## 2023-10-04 ENCOUNTER — Telehealth: Payer: Self-pay

## 2023-10-04 NOTE — Telephone Encounter (Signed)
Patient called and left a message. The nerve test shows a problem in her lower back, not her ankle.  Do you still want her to have the ankle MRI? It is scheduled for 1/29

## 2023-10-05 ENCOUNTER — Ambulatory Visit: Payer: HMO | Admitting: Podiatry

## 2023-10-08 DIAGNOSIS — S233XXA Sprain of ligaments of thoracic spine, initial encounter: Secondary | ICD-10-CM | POA: Diagnosis not present

## 2023-10-08 DIAGNOSIS — S134XXA Sprain of ligaments of cervical spine, initial encounter: Secondary | ICD-10-CM | POA: Diagnosis not present

## 2023-10-08 DIAGNOSIS — M9903 Segmental and somatic dysfunction of lumbar region: Secondary | ICD-10-CM | POA: Diagnosis not present

## 2023-10-08 DIAGNOSIS — M9902 Segmental and somatic dysfunction of thoracic region: Secondary | ICD-10-CM | POA: Diagnosis not present

## 2023-10-08 DIAGNOSIS — M47816 Spondylosis without myelopathy or radiculopathy, lumbar region: Secondary | ICD-10-CM | POA: Diagnosis not present

## 2023-10-08 DIAGNOSIS — M9901 Segmental and somatic dysfunction of cervical region: Secondary | ICD-10-CM | POA: Diagnosis not present

## 2023-10-09 DIAGNOSIS — C44622 Squamous cell carcinoma of skin of right upper limb, including shoulder: Secondary | ICD-10-CM | POA: Diagnosis not present

## 2023-10-09 DIAGNOSIS — D0461 Carcinoma in situ of skin of right upper limb, including shoulder: Secondary | ICD-10-CM | POA: Diagnosis not present

## 2023-10-10 ENCOUNTER — Ambulatory Visit
Admission: RE | Admit: 2023-10-10 | Discharge: 2023-10-10 | Disposition: A | Discharge status: Home / Self Care | Payer: HMO | Source: Ambulatory Visit | Attending: Podiatry | Admitting: Podiatry

## 2023-10-10 DIAGNOSIS — M25572 Pain in left ankle and joints of left foot: Secondary | ICD-10-CM | POA: Diagnosis not present

## 2023-10-10 DIAGNOSIS — M7989 Other specified soft tissue disorders: Secondary | ICD-10-CM | POA: Diagnosis not present

## 2023-10-10 DIAGNOSIS — M899 Disorder of bone, unspecified: Secondary | ICD-10-CM

## 2023-10-10 DIAGNOSIS — R936 Abnormal findings on diagnostic imaging of limbs: Secondary | ICD-10-CM | POA: Diagnosis not present

## 2023-10-10 DIAGNOSIS — M7750 Other enthesopathy of unspecified foot: Secondary | ICD-10-CM

## 2023-10-11 ENCOUNTER — Other Ambulatory Visit (HOSPITAL_COMMUNITY): Payer: Self-pay

## 2023-10-15 ENCOUNTER — Encounter: Payer: Self-pay | Admitting: Podiatry

## 2023-10-15 ENCOUNTER — Telehealth: Payer: Self-pay | Admitting: Podiatry

## 2023-10-15 NOTE — Telephone Encounter (Signed)
Patient is requesting MRI results. Please contact patient. [phone]

## 2023-10-16 ENCOUNTER — Other Ambulatory Visit: Payer: Self-pay | Admitting: Podiatry

## 2023-10-16 ENCOUNTER — Telehealth: Payer: Self-pay | Admitting: Podiatry

## 2023-10-16 DIAGNOSIS — M48062 Spinal stenosis, lumbar region with neurogenic claudication: Secondary | ICD-10-CM

## 2023-10-16 NOTE — Telephone Encounter (Signed)
Patient stated Dr. Ardelle Anton will give patient, referral for orthopedic doctor for back issues. Patient contact telephone number; [phone]

## 2023-10-23 DIAGNOSIS — S134XXA Sprain of ligaments of cervical spine, initial encounter: Secondary | ICD-10-CM | POA: Diagnosis not present

## 2023-10-23 DIAGNOSIS — M9902 Segmental and somatic dysfunction of thoracic region: Secondary | ICD-10-CM | POA: Diagnosis not present

## 2023-10-23 DIAGNOSIS — S233XXA Sprain of ligaments of thoracic spine, initial encounter: Secondary | ICD-10-CM | POA: Diagnosis not present

## 2023-10-23 DIAGNOSIS — M9903 Segmental and somatic dysfunction of lumbar region: Secondary | ICD-10-CM | POA: Diagnosis not present

## 2023-10-23 DIAGNOSIS — M9901 Segmental and somatic dysfunction of cervical region: Secondary | ICD-10-CM | POA: Diagnosis not present

## 2023-10-23 DIAGNOSIS — M47816 Spondylosis without myelopathy or radiculopathy, lumbar region: Secondary | ICD-10-CM | POA: Diagnosis not present

## 2023-10-26 DIAGNOSIS — R0600 Dyspnea, unspecified: Secondary | ICD-10-CM | POA: Diagnosis not present

## 2023-10-26 DIAGNOSIS — I10 Essential (primary) hypertension: Secondary | ICD-10-CM | POA: Diagnosis not present

## 2023-10-26 DIAGNOSIS — H6993 Unspecified Eustachian tube disorder, bilateral: Secondary | ICD-10-CM | POA: Diagnosis not present

## 2023-10-26 DIAGNOSIS — M79651 Pain in right thigh: Secondary | ICD-10-CM | POA: Diagnosis not present

## 2023-10-26 DIAGNOSIS — Z299 Encounter for prophylactic measures, unspecified: Secondary | ICD-10-CM | POA: Diagnosis not present

## 2023-10-26 DIAGNOSIS — M069 Rheumatoid arthritis, unspecified: Secondary | ICD-10-CM | POA: Diagnosis not present

## 2023-10-29 ENCOUNTER — Encounter: Payer: Self-pay | Admitting: Physical Medicine and Rehabilitation

## 2023-10-29 ENCOUNTER — Ambulatory Visit (INDEPENDENT_AMBULATORY_CARE_PROVIDER_SITE_OTHER): Payer: HMO | Admitting: Physical Medicine and Rehabilitation

## 2023-10-29 DIAGNOSIS — M5442 Lumbago with sciatica, left side: Secondary | ICD-10-CM

## 2023-10-29 DIAGNOSIS — G8929 Other chronic pain: Secondary | ICD-10-CM | POA: Diagnosis not present

## 2023-10-29 DIAGNOSIS — M797 Fibromyalgia: Secondary | ICD-10-CM | POA: Diagnosis not present

## 2023-10-29 DIAGNOSIS — M5441 Lumbago with sciatica, right side: Secondary | ICD-10-CM | POA: Diagnosis not present

## 2023-10-29 DIAGNOSIS — R0600 Dyspnea, unspecified: Secondary | ICD-10-CM | POA: Diagnosis not present

## 2023-10-29 DIAGNOSIS — M5416 Radiculopathy, lumbar region: Secondary | ICD-10-CM

## 2023-10-29 NOTE — Progress Notes (Addendum)
 Marie Farmer - 68 y.o. female MRN [id number]  Date of birth: 1955-11-05  Office Visit Note: Visit Date: 10/29/2023 PCP: Ignatius Specking, MD Referred by: Vivi Barrack, DPM  Subjective: Chief Complaint  Patient presents with   Lower Back - Pain   HPI: Marie Farmer is a 68 y.o. female who comes in today per the request of Dr. Ovid Curd for evaluation of chronic, worsening and severe bilateral lower back pain radiating to buttocks and down lateral legs to knees. Paresthesias to bilateral legs. Also reports chronic pain to bilateral ankles for several years. Patient was last seen in our office in 2023. Her pain is constant, worsens with activity and movement. She describes her pain as sore, aching and stabbing sensation, currently rates as 10 out of 10. Some relief of pain with home exercise regimen, rest and use of medications. Patient is active, she does exercise several times a week and attends [employer] classes. She has undergone multiple interventional procedures in our office including radiofrequency ablation and right ischial bursa injection. She reports these procedures helped to alleviate her pain intermittently. She relocated to [facility] in 2022, was seen by neurosurgery. Per these notes in care everywhere, posterior left L4-5 minimally invasive laminectomy was recommended at that time. Patient did not proceed with surgical intervention at that time. Lumbar MRI imaging from [facility] in 2023 shows stable lumbar spine degeneration when compared to 2019. No neural impingement or visible inflammation. She recently underwent bilateral lower extremity nerve conduction study at [facility] in December, however we are unable to see these test results in EPIC. Patient denies focal weakness. No recent trauma or falls.   Patient does carry diagnosis of fibromyalgia.       Review of Systems  Musculoskeletal:  Positive for back pain and myalgias.  Neurological:  Positive  for tingling. Negative for focal weakness and weakness.  All other systems reviewed and are negative.  Otherwise per HPI.  Assessment & Plan: Visit Diagnoses:    ICD-10-CM   1. Chronic bilateral low back pain with bilateral sciatica  M54.42 MR LUMBAR SPINE WO CONTRAST   M54.41    G89.29     2. Lumbar radiculopathy  M54.16 MR LUMBAR SPINE WO CONTRAST    3. Fibromyalgia  M79.7 MR LUMBAR SPINE WO CONTRAST       Plan: Findings:  Chronic, worsening and severe bilateral lower back pain radiating to buttocks and down lateral legs to knees in the setting of fibromyalgia. Paresthesias noted to bilateral lower extremities. Patient continues to have severe pain despite good conservative therapies such as formal physical therapy, home exercise regimen, rest and use of medications. Patients clinical presentation and exam are complex, her pain does follow more of an L5 nerve pattern. Prior lumbar MRI imaging from 2023 does not show any evidence of central canal stenosis, no severe nerve impingement. There is degenerative facet spurring on the right at L5-S1, however this does not explain her bilateral symptoms. I do believe her fibromyalgia/chronic pain is working to exacerbate her symptoms. I had patient complete medical release form so that we can get copy of recent bilateral lower extremity nerve study ([facility]). I also placed order for new lumbar MRI imaging. I will see patient back for lumbar MRI review and to discuss treatment options. No red flag symptoms noted upon exam today.          Meds & Orders: No orders of the defined types were placed in this encounter.  Orders Placed This Encounter  Procedures   MR LUMBAR SPINE WO CONTRAST    Follow-up: Return for Lumbar MRI review.   Procedures: No procedures performed      Clinical History: CLINICAL DATA:  Low back pain persistent for 6 weeks despite treatment left leg numbness   EXAM: MRI LUMBAR SPINE WITHOUT CONTRAST    TECHNIQUE: Multiplanar, multisequence MR imaging of the lumbar spine was performed. No intravenous contrast was administered.   COMPARISON:  MRI lumbar Feb 01, 2017   FINDINGS: Segmentation:  Transitional S1 vertebral body   Alignment:  Physiologic.   Vertebrae:  No fracture, evidence of discitis, or bone lesion.   Conus medullaris and cauda equina: Conus extends to the L1-L2 level. Conus and cauda equina appear normal.   Paraspinal and other soft tissues: Negative.   Disc levels:   T12 - L1 No disc protrusion. No foraminal stenosis. No central canal stenosis.   L1-L2: No disc protrusion. No foraminal stenosis. No central canal stenosis.   L2-L3:No disc protrusion. No foraminal stenosis. No central canal stenosis.   L3-L4: Narrowing of the disc space slightly desiccated disc material with a small central spondylitic disc herniation effacing the ventral anterior epidural space flattening the thecal sac no foraminal encroachment. No canal stenosis.   L4-L5: Narrowing of the disc space with desiccated disc material with a left lateral small disc herniation encroaching the left neural foramina.   L5-S1: Narrowing of the disc space, desiccated disc material with a small right and left paracentral spondylitic disc herniations with incipient encroachment of the neural foramina. No foraminal narrowing.   IMPRESSION: Small spondylitic disc herniations at L3-L4, L4-L5 and L5-S1 as described above.     Electronically Signed   By: Shaaron Adler M.D.   On: 11/09/2023 16:11   She reports that she has never smoked. She has never used smokeless tobacco. No results for input(s): "HGBA1C", "LABURIC" in the last 8760 hours.  Objective:  VS:  HT:    WT:   BMI:     BP:   HR: bpm  TEMP: ( )  RESP:  Physical Exam Vitals and nursing note reviewed.  HENT:     Head: Normocephalic and atraumatic.     Right Ear: External ear normal.     Left Ear: External ear normal.      Nose: Nose normal.     Mouth/Throat:     Mouth: Mucous membranes are moist.  Eyes:     Extraocular Movements: Extraocular movements intact.  Cardiovascular:     Rate and Rhythm: Normal rate.     Pulses: Normal pulses.  Pulmonary:     Effort: Pulmonary effort is normal.  Abdominal:     General: Abdomen is flat. There is no distension.  Musculoskeletal:        General: Tenderness present.     Cervical back: Normal range of motion.     Comments: Patient rises from seated position to standing without difficulty. Good lumbar range of motion. No pain noted with facet loading. 5/5 strength noted with bilateral hip flexion, knee flexion/extension, ankle dorsiflexion/plantarflexion and EHL. No clonus noted bilaterally. No pain upon palpation of greater trochanters. No pain with internal/external rotation of bilateral hips. Sensation intact bilaterally. Dysesthesias noted to bilateral L5 dermatomes. Negative slump test bilaterally. Ambulates without aid, gait steady.     Skin:    General: Skin is warm and dry.     Capillary Refill: Capillary refill takes less than 2 seconds.  Neurological:  General: No focal deficit present.     Mental Status: She is alert and oriented to person, place, and time.  Psychiatric:        Mood and Affect: Mood normal.        Behavior: Behavior normal.     Ortho Exam  Imaging: No results found.  Past Medical/Family/Surgical/Social History: Medications & Allergies reviewed per EMR, new medications updated. 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   Other specified nontoxic goiter 12/12/2021   Anemia of chronic disease 08/02/2021   Ankle impingement syndrome, left 03/25/2020   Osteochondritis dissecans of talus, left  03/25/2020   Instability of left ankle joint 02/03/2020   Plantar fasciitis 04/18/2018   Aphthous ulcer of mouth 04/02/2018   Difficulty sleeping 04/02/2018   Under emotional stress 04/02/2018   OSA on CPAP 02/01/2018   Dyspnea on exertion 10/16/2016   Essential hypertension 10/16/2016   Morbid obesity due to excess calories (HCC) 10/16/2016   IBS (irritable bowel syndrome) 02/29/2012   Tibial plateau fracture 05/10/2011   Helicobacter pylori infection 08/25/2009   DEPRESSION 08/25/2009   Asthma 08/25/2009   GASTROESOPHAGEAL REFLUX DISEASE, CHRONIC 08/25/2009   Allergy 08/25/2009   Cholelithiasis 08/25/2009   Past Medical History:  Diagnosis Date   Anxiety    Asthma    DDD (degenerative disc disease)    chronic back pain   Fibromyalgia    Foot pain    GERD (gastroesophageal reflux disease)    Hypertension    IBS (irritable bowel syndrome)    Mood disorder (HCC)    PONV (postoperative nausea and vomiting)    Sleep apnea    Venous insufficiency    Wears glasses    Family History  Problem Relation Age of Onset   Diabetes Mother    Coronary artery disease Mother    COPD Mother    Colon polyps Mother        age 51   Lung cancer Father    Breast cancer Neg Hx    Past Surgical History:  Procedure Laterality Date   CARPAL TUNNEL RELEASE Right 06/10/2013   Procedure: CARPAL TUNNEL RELEASE;  Surgeon: Nicki Reaper, MD;  Location: [facility];  Service: Orthopedics;  Laterality: Right;   CHOLECYSTECTOMY N/A 08/19/2021   Procedure: LAPAROSCOPIC CHOLECYSTECTOMY;  Surgeon: Lucretia Roers, MD;  Location: [facility];  Service: General;  Laterality: N/A;   COLONOSCOPY  12/20/2011   [name]-single diminutive sessile polyp in the sigmoid colon/small interenal hemorrhoids   COLONOSCOPY N/A 11/28/2017   Procedure: COLONOSCOPY;  Surgeon: Corbin Ade, MD;  Location: [facility];  Service: Endoscopy;  Laterality: N/A;  10:30   COLONOSCOPY WITH PROPOFOL N/A  07/07/2021   one 9 mm polyp in rectum s/p removal (serrated adenoma). surveillance in 5 years   ESOPHAGOGASTRODUODENOSCOPY  04/10/2003   Normal esophagus/Antral erosions, as described above.  The remainder of the stomach appeared normal.  Normal first and second parts of the duodenum.   ESOPHAGOGASTRODUODENOSCOPY  12/20/2011   [name]-hiatus hernia/chronic gastritis/normal duodenumNEGATIVE H pylori, negative SB biopsy   POLYPECTOMY  07/07/2021   Procedure: POLYPECTOMY;  Surgeon: Corbin Ade, MD;  Location: [facility];  Service: Endoscopy;;   TIBIA FRACTURE SURGERY  09/11/2010   right   Social  History   Occupational History   Occupation: unemployed  Tobacco Use   Smoking status: Never   Smokeless tobacco: Never  Vaping Use   Vaping status: Never Used  Substance and Sexual Activity   Alcohol use: Yes    Comment: socially, couple times per week   Drug use: No    Types: Marijuana    Comment: remote maijuana   Sexual activity: Never    Birth control/protection: None

## 2023-10-29 NOTE — Progress Notes (Signed)
 Pain score =8

## 2023-10-30 ENCOUNTER — Ambulatory Visit
Admission: RE | Admit: 2023-10-30 | Discharge: 2023-10-30 | Disposition: A | Discharge status: Home / Self Care | Payer: HMO | Source: Ambulatory Visit | Attending: Physical Medicine and Rehabilitation | Admitting: Physical Medicine and Rehabilitation

## 2023-10-30 ENCOUNTER — Encounter: Payer: Self-pay | Admitting: Podiatry

## 2023-10-30 DIAGNOSIS — G8929 Other chronic pain: Secondary | ICD-10-CM

## 2023-10-30 DIAGNOSIS — M5416 Radiculopathy, lumbar region: Secondary | ICD-10-CM

## 2023-10-30 DIAGNOSIS — M797 Fibromyalgia: Secondary | ICD-10-CM

## 2023-11-02 ENCOUNTER — Encounter: Payer: Self-pay | Admitting: Podiatry

## 2023-11-02 ENCOUNTER — Ambulatory Visit: Payer: HMO | Admitting: Podiatry

## 2023-11-02 ENCOUNTER — Telehealth: Payer: Self-pay | Admitting: Podiatry

## 2023-11-02 NOTE — Telephone Encounter (Signed)
Pt called checking on mri results. ?

## 2023-11-03 ENCOUNTER — Other Ambulatory Visit (HOSPITAL_COMMUNITY): Payer: Self-pay

## 2023-11-05 ENCOUNTER — Telehealth: Payer: Self-pay | Admitting: Physical Medicine and Rehabilitation

## 2023-11-05 NOTE — Telephone Encounter (Signed)
 Patient returned call asked for a call back. The number to contact patient is [phone]

## 2023-11-06 DIAGNOSIS — I272 Pulmonary hypertension, unspecified: Secondary | ICD-10-CM | POA: Diagnosis not present

## 2023-11-06 DIAGNOSIS — I10 Essential (primary) hypertension: Secondary | ICD-10-CM | POA: Diagnosis not present

## 2023-11-06 DIAGNOSIS — R0981 Nasal congestion: Secondary | ICD-10-CM | POA: Diagnosis not present

## 2023-11-06 DIAGNOSIS — Z6837 Body mass index (BMI) 37.0-37.9, adult: Secondary | ICD-10-CM | POA: Diagnosis not present

## 2023-11-06 DIAGNOSIS — Z299 Encounter for prophylactic measures, unspecified: Secondary | ICD-10-CM | POA: Diagnosis not present

## 2023-11-15 ENCOUNTER — Encounter: Payer: Self-pay | Admitting: Cardiology

## 2023-11-15 ENCOUNTER — Telehealth: Payer: Self-pay | Admitting: Cardiology

## 2023-11-15 ENCOUNTER — Encounter: Payer: Self-pay | Admitting: Physical Medicine and Rehabilitation

## 2023-11-15 ENCOUNTER — Ambulatory Visit: Payer: HMO | Attending: Cardiology | Admitting: Cardiology

## 2023-11-15 ENCOUNTER — Ambulatory Visit: Admitting: Physical Medicine and Rehabilitation

## 2023-11-15 VITALS — BP 133/89 | HR 38

## 2023-11-15 VITALS — BP 123/78 | HR 81 | Ht 63.0 in | Wt 207.0 lb

## 2023-11-15 DIAGNOSIS — G894 Chronic pain syndrome: Secondary | ICD-10-CM

## 2023-11-15 DIAGNOSIS — M5442 Lumbago with sciatica, left side: Secondary | ICD-10-CM

## 2023-11-15 DIAGNOSIS — M5416 Radiculopathy, lumbar region: Secondary | ICD-10-CM

## 2023-11-15 DIAGNOSIS — G8929 Other chronic pain: Secondary | ICD-10-CM

## 2023-11-15 DIAGNOSIS — I272 Pulmonary hypertension, unspecified: Secondary | ICD-10-CM | POA: Diagnosis not present

## 2023-11-15 DIAGNOSIS — I10 Essential (primary) hypertension: Secondary | ICD-10-CM

## 2023-11-15 DIAGNOSIS — M797 Fibromyalgia: Secondary | ICD-10-CM

## 2023-11-15 DIAGNOSIS — G4733 Obstructive sleep apnea (adult) (pediatric): Secondary | ICD-10-CM

## 2023-11-15 DIAGNOSIS — M5441 Lumbago with sciatica, right side: Secondary | ICD-10-CM | POA: Diagnosis not present

## 2023-11-15 DIAGNOSIS — R0609 Other forms of dyspnea: Secondary | ICD-10-CM | POA: Diagnosis not present

## 2023-11-15 NOTE — Progress Notes (Signed)
 Clinical Summary Ms. Bralley is a 68 y.o.female seen today as a new consult, referred by Dr Sherril Croon for the following medical problems.  1.Dyspnea - DOE she noted after walking up from her chickens - she noted significant improvement after stopping her metoprolol but was not resolved. She does report a history of asthma.  - denies any chest pains. No significant LE edema.  - just recentlty did 30 minutes on a stairmaster so is able to maintain   -10/2023 echo pcp: LVEF 58%, grade I dd, normal RV, mild MR, mild to mod TR, mod pulm HTN (RVSP 41) - from notes suspect obesity hypovent. Has known OSA.    2.OSA - does not use cpap machine   3. HTN - she is on valsartan/hydrochlorothiazide - reports some high bp at home at times  Past Medical History:  Diagnosis Date   Anxiety    Asthma    DDD (degenerative disc disease)    chronic back pain   Fibromyalgia    Foot pain    GERD (gastroesophageal reflux disease)    Hypertension    IBS (irritable bowel syndrome)    Mood disorder (HCC)    PONV (postoperative nausea and vomiting)    Sleep apnea    Venous insufficiency    Wears glasses      Allergies  Allergen Reactions   Ace Inhibitors Shortness Of Breath   Adhesive [Tape]     Peels skin   Cymbalta [Duloxetine Hcl]     Gums red, mouth felt hout   Latex     Sensitive skin, causes redness    Methylprednisolone     Redness all over   Prednisone Other (See Comments)    Redness all over   Azithromycin Rash   Nabumetone Rash and Other (See Comments)    lips swollen       Current Outpatient Medications  Medication Sig Dispense Refill   acetaminophen (TYLENOL) 500 MG tablet Take 1,000 mg by mouth every 6 (six) hours as needed for moderate pain.     albuterol (VENTOLIN HFA) 108 (90 Base) MCG/ACT inhaler Inhale 2 puffs into the lungs every 6 (six) hours as needed for wheezing or shortness of breath.     ergocalciferol (VITAMIN D2) 1.25 MG (50000 UT) capsule Take 1  capsule (50,000 Units total) by mouth once a week. 12 capsule 4   estradiol (ESTRACE) 0.1 MG/GM vaginal cream Place 1 Applicatorful vaginally daily as needed (Menopause).     fexofenadine (ALLEGRA) 180 MG tablet Take 1 tablet (180 mg total) by mouth daily. 90 tablet 3   FLUoxetine (PROZAC) 20 MG capsule Take 20 mg by mouth daily.     gabapentin (NEURONTIN) 600 MG tablet Take 1 tablet (600 mg total) by mouth 3 (three) times daily. 360 tablet 2   gabapentin (NEURONTIN) 600 MG tablet Take 1 tablet (600 mg total) by mouth 3 (three) times daily. 360 tablet 2   levothyroxine (SYNTHROID) 75 MCG tablet Take 1 tablet (75 mcg total) by mouth every morning on an empty stomach. 90 tablet 5   methocarbamol (ROBAXIN) 500 MG tablet Take 500 mg by mouth 2 (two) times daily.     methocarbamol (ROBAXIN) 500 MG tablet Take 1 (one) tablet by mouth at 8am and 3pm for spinal stenosis 180 tablet 0   methocarbamol (ROBAXIN) 500 MG tablet Take 1 tablet (500 mg total) by mouth 2 (two) times daily (8am & 3pm), for spinal stenosis. 180 tablet 0  methylPREDNISolone (MEDROL DOSEPAK) 4 MG TBPK tablet Take as directed 21 tablet 0   metoprolol tartrate (LOPRESSOR) 25 MG tablet Take 25 mg by mouth daily.     montelukast (SINGULAIR) 10 MG tablet Take 10 mg by mouth daily.     omeprazole (PRILOSEC) 20 MG capsule Take 1 capsule (20 mg total) by mouth 2 (two) times daily. 180 capsule 4   omeprazole (PRILOSEC) 20 MG capsule Take 1 capsule (20 mg total) by mouth 2 (two) times daily. 180 capsule 4   sulfaSALAzine (AZULFIDINE) 500 MG tablet Take by mouth.     sulfaSALAzine (AZULFIDINE) 500 MG tablet Take 3 tablets (1,500 mg total) by mouth 2 (two) times daily. 540 tablet 4   tiZANidine (ZANAFLEX) 4 MG tablet Take 1 tablet (4 mg total) by mouth 3 (three) times daily as needed. 270 tablet 0   tiZANidine (ZANAFLEX) 4 MG tablet Take 1 tablet (4 mg total) by mouth 3 (three) times daily as needed. 270 tablet 0   valsartan-hydrochlorothiazide  (DIOVAN-HCT) 160-25 MG tablet Take 1 tablet by mouth daily. 90 tablet 4   valsartan-hydrochlorothiazide (DIOVAN-HCT) 160-25 MG tablet Take 1 tablet by mouth daily. 90 tablet 4   Vitamin D, Ergocalciferol, (DRISDOL) 1.25 MG (50000 UNIT) CAPS capsule Take 1 capsule (50,000 Units total) by mouth once a week. 12 capsule 0   No current facility-administered medications for this visit.     Past Surgical History:  Procedure Laterality Date   CARPAL TUNNEL RELEASE Right 06/10/2013   Procedure: CARPAL TUNNEL RELEASE;  Surgeon: Nicki Reaper, MD;  Location: [facility];  Service: Orthopedics;  Laterality: Right;   CHOLECYSTECTOMY N/A 08/19/2021   Procedure: LAPAROSCOPIC CHOLECYSTECTOMY;  Surgeon: Lucretia Roers, MD;  Location: [facility];  Service: General;  Laterality: N/A;   COLONOSCOPY  12/20/2011   [name]-single diminutive sessile polyp in the sigmoid colon/small interenal hemorrhoids   COLONOSCOPY N/A 11/28/2017   Procedure: COLONOSCOPY;  Surgeon: Corbin Ade, MD;  [facility];  Service: Endoscopy;  Laterality: N/A;  10:30   COLONOSCOPY WITH PROPOFOL N/A 07/07/2021   one 9 mm polyp in rectum s/p removal (serrated adenoma). surveillance in 5 years   ESOPHAGOGASTRODUODENOSCOPY  04/10/2003   Normal esophagus/Antral erosions, as described above.  The remainder of the stomach appeared normal.  Normal first and second parts of the duodenum.   ESOPHAGOGASTRODUODENOSCOPY  12/20/2011   [name]-hiatus hernia/chronic gastritis/normal duodenumNEGATIVE H pylori, negative SB biopsy   POLYPECTOMY  07/07/2021   Procedure: POLYPECTOMY;  Surgeon: Corbin Ade, MD;  [facility];  Service: Endoscopy;;   TIBIA FRACTURE SURGERY  09/11/2010   right     Allergies  Allergen Reactions   Ace Inhibitors Shortness Of Breath   Adhesive [Tape]     Peels skin   Cymbalta [Duloxetine Hcl]     Gums red, mouth felt hout   Latex     Sensitive skin, causes redness     Methylprednisolone     Redness all over   Prednisone Other (See Comments)    Redness all over   Azithromycin Rash   Nabumetone Rash and Other (See Comments)    lips swollen        Family History  Problem Relation Age of Onset   Diabetes Mother    Coronary artery disease Mother    COPD Mother    Colon polyps Mother        age 40   Lung cancer Father    Breast cancer  Neg Hx      Social History Ms. Seabrooks reports that she has never smoked. She has never used smokeless tobacco. Ms. Diveley reports current alcohol use.     Physical Examination Today's Vitals   11/15/23 0903  BP: 128/80  Pulse: 92  SpO2: 95%  Weight: 207 lb (93.9 kg)  Height: 5\' 3"  (1.6 m)   Body mass index is 36.67 kg/m.  Gen: resting comfortably, no acute distress HEENT: no scleral icterus, pupils equal round and reactive, no palptable cervical adenopathy,  CV: RRR, no m/rg,no jvd Resp: Clear to auscultation bilaterally GI: abdomen is soft, non-tender, non-distended, normal bowel sounds, no hepatosplenomegaly MSK: extremities are warm, no edema.  Skin: warm, no rash Neuro:  no focal deficits Psych: appropriate affect      Assessment and Plan  1.DOE - initial symptoms were more significant, she reports much improved since stopping metoprolol. Does have history of asthma, perhaps some beta blocker related asthma symptoms - mild to moderate PASP at 41 mmHg by pcp echo, normal RV function. Suspect related to her OSA which she is not using cpap for. Possible some component of OHS as well - refer to [facility] for OSA evaluation, check PFTs - EKG shows SR, no ischemic changes  2. HTN - at goal here and by recent pcp visit. Some high numbers at home but unclear if she is sitting for 5 minutes prior to checking - continue to monitor, continue current meds  3. Pulmonary HTN - mild to moderate PASP at 41 mmHg by pcp echo, normal RV function. Suspect related to her OSA which she is  not using cpap for. Possible some component of OHS as well. She had grade I diastolic dysfunction also on echo - plan for OSA eval, PFTs for now. Limited symptoms and modest pulm HTN by echo, would start with this initial workup for now     Antoine Poche, M.D

## 2023-11-15 NOTE — Progress Notes (Signed)
 Marie Farmer - 68 y.o. female MRN [id number]  Date of birth: [birth year]  Office Visit Note: Visit Date: 11/15/2023 PCP: Ignatius Specking, MD Referred by: Ignatius Specking, MD  Subjective: Chief Complaint  Patient presents with   Lower Back - Pain   Right Ankle - Pain   HPI: Marie Farmer is a 68 y.o. female who comes in today for evaluation of chronic, worsening and several bilateral lower back pain radiating to buttocks and down lateral legs. Also reports paresthesias to bilateral legs. She does report chronic pain to bilateral ankles for several years. Her left sided pain seems to be most severe at this time. She does have history of fibromyalgia. Pain ongoing for many years, worsens with activity and movement. She describes her pain as sharp and sore sensation, currently rates as 9 out of 10. Some relief of pain with home exercise regimen, rest and use of medications. She does attend [employer] classes regularly.  She has undergone multiple interventional procedures in our office including radiofrequency ablation and right ischial bursa injection. She tells me today that these procedures were not helpful in alleviating her pain. She relocated to [facility] in 2022, was seen by neurosurgery as she was recalcitrant to lumbar epidural steroid injections. Per these notes in care everywhere, posterior left L4-5 minimally invasive laminectomy was recommended at that time. Patient did not proceed with surgical intervention at that time. Recent lumbar MRI imaging shows small multi level spondylitic disc herniations. No severe nerve impingement, no severe spinal canal stenosis. Left lower extremity nerve conduction study performed at [facility] in December of 2024 shows mild findings of left lumbosacral radiculopathy affecting both the L5 and S1 nerve roots. Patient denies focal weakness. No recent trauma or falls noted.       Review of Systems  Musculoskeletal:  Positive for back pain and  myalgias.  Neurological:  Positive for tingling. Negative for focal weakness and weakness.  All other systems reviewed and are negative.  Otherwise per HPI.  Assessment & Plan: Visit Diagnoses:    ICD-10-CM   1. Chronic bilateral low back pain with bilateral sciatica  M54.42 Ambulatory referral to Physical Medicine Rehab   M54.41    G89.29     2. Lumbar radiculopathy  M54.16 Ambulatory referral to Physical Medicine Rehab    3. Fibromyalgia  M79.7 Ambulatory referral to Physical Medicine Rehab    4. Chronic pain syndrome  G89.4 Ambulatory referral to Physical Medicine Rehab       Plan: Findings:  Chronic, worsening and several bilateral lower back pain radiating to buttocks and down lateral legs in the presence of chronic pain and fibromyalgia. Also reports paresthesias to bilateral legs and chronic pain to bilateral ankles. Patient continues to have severe pain despite good conservative therapies such as home exercise regimen, rest and use of medications. Patients clinical presentation and exam are complex, prior interventional procedures have not been beneficial in alleviating her pain. Recent lumbar MRI imaging shows small multi level spondylitic disc herniations. No severe nerve impingement, no severe spinal canal stenosis. Her symptoms do not directly correlate with recent lumbar MRI imaging and recent left lower extremity nerve study. I do feel her fibromyalgia is working to exacerbate her fibromyalgia. We discussed treatment plan in detail today, next step is to perform diagnostic and hopefully therapeutic left L5-S1 interlaminar epidural steroid injection under fluoroscopic guidance. If good relief of pain with injection we can repeat this procedure infrequently as needed. Should her pain  persist would recommend referral to comprehensive pain management center. No red flag symptoms noted upon exam today.  We had a long conversation today regarding her diagnosis of fibromyalgia. She was  requesting that I explain to her how and why she was diagnosed with this disorder. I instructed her to complete fibromyalgia screening in the office today. Her widespread pain index and symptom severity scoring does meet diagnostic criteria for fibromyalgia. She has multiple sites of pain all over her body, this is a chronic issue, ongoing for many years. She has tried Cymbalta in the past but was unable to tolerate this medication due to gum and mouth issues.     Meds & Orders: No orders of the defined types were placed in this encounter.   Orders Placed This Encounter  Procedures   Ambulatory referral to Physical Medicine Rehab    Follow-up: Return for Left L5-S1 interlaminar epidural steroid injection.   Procedures: No procedures performed      Clinical History: CLINICAL DATA:  Low back pain persistent for 6 weeks despite treatment left leg numbness   EXAM: MRI LUMBAR SPINE WITHOUT CONTRAST   TECHNIQUE: Multiplanar, multisequence MR imaging of the lumbar spine was performed. No intravenous contrast was administered.   COMPARISON:  MRI lumbar Feb 01, 2017   FINDINGS: Segmentation:  Transitional S1 vertebral body   Alignment:  Physiologic.   Vertebrae:  No fracture, evidence of discitis, or bone lesion.   Conus medullaris and cauda equina: Conus extends to the L1-L2 level. Conus and cauda equina appear normal.   Paraspinal and other soft tissues: Negative.   Disc levels:   T12 - L1 No disc protrusion. No foraminal stenosis. No central canal stenosis.   L1-L2: No disc protrusion. No foraminal stenosis. No central canal stenosis.   L2-L3:No disc protrusion. No foraminal stenosis. No central canal stenosis.   L3-L4: Narrowing of the disc space slightly desiccated disc material with a small central spondylitic disc herniation effacing the ventral anterior epidural space flattening the thecal sac no foraminal encroachment. No canal stenosis.   L4-L5: Narrowing of  the disc space with desiccated disc material with a left lateral small disc herniation encroaching the left neural foramina.   L5-S1: Narrowing of the disc space, desiccated disc material with a small right and left paracentral spondylitic disc herniations with incipient encroachment of the neural foramina. No foraminal narrowing.   IMPRESSION: Small spondylitic disc herniations at L3-L4, L4-L5 and L5-S1 as described above.     Electronically Signed   By: Shaaron Adler M.D.   On: 11/09/2023 16:11   She reports that she has never smoked. She has never used smokeless tobacco. No results for input(s): "HGBA1C", "LABURIC" in the last 8760 hours.  Objective:  VS:  HT:    WT:   BMI:     BP:133/89  HR:(!) 38bpm  TEMP: ( )  RESP:  Physical Exam Vitals and nursing note reviewed.  HENT:     Head: Normocephalic and atraumatic.     Right Ear: External ear normal.     Left Ear: External ear normal.     Nose: Nose normal.     Mouth/Throat:     Mouth: Mucous membranes are moist.  Eyes:     Extraocular Movements: Extraocular movements intact.  Cardiovascular:     Rate and Rhythm: Normal rate.     Pulses: Normal pulses.  Pulmonary:     Effort: Pulmonary effort is normal.  Abdominal:     General: Abdomen is  flat. There is no distension.  Musculoskeletal:        General: Tenderness present.     Cervical back: Normal range of motion.     Comments: Patient rises from seated position to standing without difficulty. Good lumbar range of motion. No pain noted with facet loading. 5/5 strength noted with bilateral hip flexion, knee flexion/extension, ankle dorsiflexion/plantarflexion and EHL. No clonus noted bilaterally. No pain upon palpation of greater trochanters. No pain with internal/external rotation of bilateral hips. Sensation intact bilaterally. Myofascial tenderness noted to lumbar paraspinal regions upon palpation. Negative slump test bilaterally. Ambulates without aid, gait steady.      Skin:    General: Skin is warm and dry.     Capillary Refill: Capillary refill takes less than 2 seconds.  Neurological:     General: No focal deficit present.     Mental Status: She is alert and oriented to person, place, and time.  Psychiatric:        Mood and Affect: Mood normal.        Behavior: Behavior normal.     Ortho Exam  Imaging: No results found.  Past Medical/Family/Surgical/Social History: Medications & Allergies reviewed per EMR, new medications updated. 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   Other specified nontoxic goiter 12/12/2021   Anemia of chronic disease 08/02/2021   Ankle impingement syndrome, left 03/25/2020   Osteochondritis dissecans of talus, left 03/25/2020   Instability of left ankle joint 02/03/2020   Plantar fasciitis 04/18/2018   Aphthous ulcer of mouth 04/02/2018   Difficulty sleeping 04/02/2018   Under emotional stress 04/02/2018   OSA on CPAP 02/01/2018   Dyspnea on exertion 10/16/2016   Essential hypertension 10/16/2016   Morbid obesity due to excess calories (HCC) 10/16/2016   IBS (irritable bowel syndrome) 02/29/2012   Tibial plateau fracture 05/10/2011   Helicobacter pylori infection 08/25/2009   DEPRESSION 08/25/2009   Asthma 08/25/2009   GASTROESOPHAGEAL REFLUX DISEASE, CHRONIC 08/25/2009   Allergy 08/25/2009   Cholelithiasis 08/25/2009   Past Medical History:  Diagnosis Date   Anxiety    Asthma    DDD (degenerative disc disease)    chronic back pain   Fibromyalgia    Foot pain    GERD (gastroesophageal reflux disease)    Hypertension    IBS (irritable bowel syndrome)    Mood disorder (HCC)    PONV (postoperative nausea and vomiting)    Sleep apnea    Venous insufficiency     Wears glasses    Family History  Problem Relation Age of Onset   Diabetes Mother    Coronary artery disease Mother    COPD Mother    Colon polyps Mother        age 31   Lung cancer Father    Breast cancer Neg Hx    Past Surgical History:  Procedure Laterality Date   CARPAL TUNNEL RELEASE Right 06/10/2013   Procedure: CARPAL TUNNEL RELEASE;  Surgeon: Nicki Reaper, MD;  Location: [facility];  Service: Orthopedics;  Laterality: Right;   CHOLECYSTECTOMY N/A 08/19/2021   Procedure: LAPAROSCOPIC CHOLECYSTECTOMY;  Surgeon: Lucretia Roers, MD;  Location: [facility];  Service: General;  Laterality: N/A;   COLONOSCOPY  12/20/2011   [name]-single diminutive sessile polyp in  the sigmoid colon/small interenal hemorrhoids   COLONOSCOPY N/A 11/28/2017   Procedure: COLONOSCOPY;  Surgeon: Corbin Ade, MD;  Location: [facility];  Service: Endoscopy;  Laterality: N/A;  10:30   COLONOSCOPY WITH PROPOFOL N/A 07/07/2021   one 9 mm polyp in rectum s/p removal (serrated adenoma). surveillance in 5 years   ESOPHAGOGASTRODUODENOSCOPY  04/10/2003   Normal esophagus/Antral erosions, as described above.  The remainder of the stomach appeared normal.  Normal first and second parts of the duodenum.   ESOPHAGOGASTRODUODENOSCOPY  12/20/2011   [name]-hiatus hernia/chronic gastritis/normal duodenumNEGATIVE H pylori, negative SB biopsy   POLYPECTOMY  07/07/2021   Procedure: POLYPECTOMY;  Surgeon: Corbin Ade, MD;  Location: [facility];  Service: Endoscopy;;   TIBIA FRACTURE SURGERY  09/11/2010   right   Social History   Occupational History   Occupation: unemployed  Tobacco Use   Smoking status: Never   Smokeless tobacco: Never  Vaping Use   Vaping status: Never Used  Substance and Sexual Activity   Alcohol use: Yes    Comment: socially, couple times per week   Drug use: No    Types: Marijuana    Comment: remote maijuana   Sexual activity: Never    Birth  control/protection: None

## 2023-11-15 NOTE — Telephone Encounter (Signed)
 Checking percert on the following patient for testing    PFT'S 11-22-2023 At [facility]

## 2023-11-15 NOTE — Progress Notes (Signed)
 Core Outcome Measures Index (COMI) Back Score  Average Pain 9  COMI Score  80%

## 2023-11-15 NOTE — Progress Notes (Signed)
 Pain Scale   Average Pain 5

## 2023-11-15 NOTE — Patient Instructions (Signed)
 Medication Instructions:   Continue all current medications.    Labwork:  none  Testing/Procedures:  Your physician has recommended that you have a pulmonary function test. Pulmonary Function Tests are a group of tests that measure how well air moves in and out of your lungs. Office will contact with results via phone, letter or mychart.     Follow-Up:  6 months   Any Other Special Instructions Will Be Listed Below (If Applicable).  You have been referred to:  Pulmonary   If you need a refill on your cardiac medications before your next appointment, please call your pharmacy.

## 2023-11-21 DIAGNOSIS — Z124 Encounter for screening for malignant neoplasm of cervix: Secondary | ICD-10-CM | POA: Diagnosis not present

## 2023-11-21 DIAGNOSIS — Z01419 Encounter for gynecological examination (general) (routine) without abnormal findings: Secondary | ICD-10-CM | POA: Diagnosis not present

## 2023-11-21 DIAGNOSIS — Z780 Asymptomatic menopausal state: Secondary | ICD-10-CM | POA: Diagnosis not present

## 2023-11-22 ENCOUNTER — Ambulatory Visit (HOSPITAL_COMMUNITY)
Admission: RE | Admit: 2023-11-22 | Discharge: 2023-11-22 | Disposition: A | Discharge status: Home / Self Care | Source: Ambulatory Visit | Attending: Cardiology | Admitting: Cardiology

## 2023-11-22 DIAGNOSIS — R0609 Other forms of dyspnea: Secondary | ICD-10-CM | POA: Insufficient documentation

## 2023-11-22 LAB — PULMONARY FUNCTION TEST
DL/VA % pred: 93 %
DL/VA: 3.91 ml/min/mmHg/L
DLCO unc % pred: 90 %
DLCO unc: 17.38 ml/min/mmHg
FEF 25-75 Post: 3.62 L/s
FEF 25-75 Pre: 3.77 L/s
FEF2575-%Change-Post: -4 %
FEF2575-%Pred-Post: 174 %
FEF2575-%Pred-Pre: 181 %
FEV1-%Change-Post: 0 %
FEV1-%Pred-Post: 112 %
FEV1-%Pred-Pre: 111 %
FEV1-Post: 2.61 L
FEV1-Pre: 2.58 L
FEV1FVC-%Change-Post: 0 %
FEV1FVC-%Pred-Pre: 113 %
FEV6-%Change-Post: 0 %
FEV6-%Pred-Post: 101 %
FEV6-%Pred-Pre: 100 %
FEV6-Post: 2.95 L
FEV6-Pre: 2.95 L
FEV6FVC-%Pred-Post: 104 %
FEV6FVC-%Pred-Pre: 104 %
FVC-%Change-Post: 0 %
FVC-%Pred-Post: 97 %
FVC-%Pred-Pre: 96 %
FVC-Post: 2.95 L
FVC-Pre: 2.95 L
Post FEV1/FVC ratio: 88 %
Post FEV6/FVC ratio: 100 %
Pre FEV1/FVC ratio: 88 %
Pre FEV6/FVC Ratio: 100 %

## 2023-11-22 MED ORDER — ALBUTEROL SULFATE (2.5 MG/3ML) 0.083% IN NEBU
2.5000 mg | INHALATION_SOLUTION | Freq: Once | RESPIRATORY_TRACT | Status: AC
  Administered 2023-11-22: 2.5 mg via RESPIRATORY_TRACT

## 2023-11-23 ENCOUNTER — Ambulatory Visit: Admitting: Podiatry

## 2023-11-23 DIAGNOSIS — I10 Essential (primary) hypertension: Secondary | ICD-10-CM | POA: Diagnosis not present

## 2023-11-23 DIAGNOSIS — Z299 Encounter for prophylactic measures, unspecified: Secondary | ICD-10-CM | POA: Diagnosis not present

## 2023-11-23 DIAGNOSIS — H6993 Unspecified Eustachian tube disorder, bilateral: Secondary | ICD-10-CM | POA: Diagnosis not present

## 2023-11-23 DIAGNOSIS — M069 Rheumatoid arthritis, unspecified: Secondary | ICD-10-CM | POA: Diagnosis not present

## 2023-11-23 DIAGNOSIS — R413 Other amnesia: Secondary | ICD-10-CM | POA: Diagnosis not present

## 2023-11-23 DIAGNOSIS — F339 Major depressive disorder, recurrent, unspecified: Secondary | ICD-10-CM | POA: Diagnosis not present

## 2023-11-26 ENCOUNTER — Ambulatory Visit (HOSPITAL_COMMUNITY)
Admission: RE | Admit: 2023-11-26 | Discharge: 2023-11-26 | Disposition: A | Discharge status: Home / Self Care | Source: Ambulatory Visit | Attending: Adult Health | Admitting: Adult Health

## 2023-11-26 ENCOUNTER — Ambulatory Visit: Admitting: Adult Health

## 2023-11-26 ENCOUNTER — Encounter: Payer: Self-pay | Admitting: Adult Health

## 2023-11-26 VITALS — BP 134/77 | HR 87 | Ht 63.0 in | Wt 207.6 lb

## 2023-11-26 DIAGNOSIS — R0602 Shortness of breath: Secondary | ICD-10-CM | POA: Diagnosis not present

## 2023-11-26 DIAGNOSIS — I272 Pulmonary hypertension, unspecified: Secondary | ICD-10-CM

## 2023-11-26 DIAGNOSIS — J4520 Mild intermittent asthma, uncomplicated: Secondary | ICD-10-CM | POA: Insufficient documentation

## 2023-11-26 DIAGNOSIS — G4733 Obstructive sleep apnea (adult) (pediatric): Secondary | ICD-10-CM | POA: Diagnosis not present

## 2023-11-26 DIAGNOSIS — J45909 Unspecified asthma, uncomplicated: Secondary | ICD-10-CM | POA: Diagnosis not present

## 2023-11-26 NOTE — Patient Instructions (Addendum)
 Continue on Singulair 10mg  At bedtime .  Try Zyrtec 10mg  At bedtime  .As needed  drainage/allergies  Albuterol inhaler As needed   Chest xray today.   Set up for split night sleep study .  Work on healthy weight loss  Do not drive if sleepy  Use caution with sedating medications   Follow up in 6 weeks and As needed

## 2023-11-26 NOTE — Progress Notes (Signed)
 Marie Farmer was here for  @Patient  ID: Marie Farmer, female    DOB: 10-27-1955, 68 y.o.   MRN: [id number]  Chief Complaint  Patient presents with   Consult   Discussed the use of AI scribe software for clinical note transcription with the patient, who gave verbal consent to proceed.  Referring provider: Ignatius Specking, MD  HPI: 68 year old female seen for sleep /pulmonary consult November 26, 2023 for daytime sleepiness, dyspnea and pulmonary hypertension  TEST/EVENTS :   11/26/2023 Sleep consult  Marie Farmer is a 68 year old female presents today for an sleep consult for shortness of breath, pulmonary hypertension and sleep apnea.  Patient was kindly referred by Dr. Wyline Mood.  Patient was referred to cardiology for shortness of breath.  2D echo showed EF at 58%, grade 1 diastolic dysfunction, normal right ventricular size, mild MR, mild to moderate TR and moderate pulmonary hypertension with RVSP at 41 mmHg. She has been experiencing shortness of breath for the past few months. This symptom has been variable, with some improvement noted after discontinuing a beta blocker due to worsening breathing symptoms. She has a history of asthma and uses an albuterol inhaler as needed.  Says she rarely uses albuterol.  Has minimum cough or wheezing.  She was set up for pulmonary function testing that was completed on November 22, 2023 that showed normal lung function with no airflow obstruction or restriction and normal diffusing capacity.  She has a minimum smoking history.  Does have some secondhand smoke exposure.  She does take Singulair for chronic allergies.  Occasionally has some nasal congestion and postnasal drip. She experiences occasional asthma symptoms, particularly in the morning, and describes a sensation of not being able to get enough air. No current allergy symptoms despite the blooming season. She says that she has been diagnosed with sleep apnea in the past on 2 separate occasions.  She initially  wore CPAP for a few years and then stopped and most recently started CPAP about 4 to 5 years ago wore it for short period of time but stopped due to eye irritation in her nose.  She typically goes to bed about 10 PM.  Goes to sleep very quickly.  Is up multiple times throughout the night.  Gets up about 7 AM.  Weight is up about 20 pounds.  Current weight is at 207 pounds with a BMI of 36.  We do not have any record of previous sleep studies.  She has no symptoms suspicious for cataplexy or sleep paralysis.  She does not nap.  Does take a muscle relaxer for chronic neck and back pain.  She does not nap.  Epworth score is 4 out of 24.  Typically gets sleepy if she sits down to watch TV, passenger of a car and in the evening hours. She does have outdoor chickens.  She says she does not have to go into the chicken coop.  She does have some indoor pets-dogs.  No hot tub or basement.  Is from [place].  She has rheumatoid arthritis, managed with sulfasalazine since 2019. She also takes gabapentin for chronic pain in her neck, back, and joints, and a muscle relaxer at night for back pain. No history of stroke or congestive heart failure.  Social history patient is single.  Lives with her daughter.  She is retired.  She has minimal smoking history.  No alcohol or drug use.  Says she does smoke marijuana when she was a teenager.  Family  history positive for cancer, coronary artery disease and diabetes           Allergies  Allergen Reactions   Ace Inhibitors Shortness Of Breath   Beta Adrenergic Blockers Shortness Of Breath   Adhesive [Tape]     Peels skin   Cymbalta [Duloxetine Hcl]     Gums red, mouth felt hout   Latex     Sensitive skin, causes redness    Methylprednisolone     Redness all over   Prednisone Other (See Comments)    Redness all over   Azithromycin Rash   Nabumetone Rash and Other (See Comments)    lips swollen      Immunization History  Administered Date(s) Administered    Influenza Inj Mdck Quad With Preservative 05/31/2020   Influenza-Unspecified 10/23/2016   PFIZER Comirnaty(Gray Top)Covid-19 Tri-Sucrose Vaccine 10/01/2020   PFIZER(Purple Top)SARS-COV-2 Vaccination 01/05/2020, 01/26/2020   Tdap 01/13/2013    Past Medical History:  Diagnosis Date   Anxiety    Asthma    DDD (degenerative disc disease)    chronic back pain   Fibromyalgia    Foot pain    GERD (gastroesophageal reflux disease)    Hypertension    IBS (irritable bowel syndrome)    Mood disorder (HCC)    PONV (postoperative nausea and vomiting)    Sleep apnea    Venous insufficiency    Wears glasses     Tobacco History: Social History   Tobacco Use  Smoking Status Never  Smokeless Tobacco Never   Counseling given: Not Answered   Outpatient Medications Prior to Visit  Medication Sig Dispense Refill   acetaminophen (TYLENOL) 500 MG tablet Take 1,000 mg by mouth every 6 (six) hours as needed for moderate pain.     albuterol (VENTOLIN HFA) 108 (90 Base) MCG/ACT inhaler Inhale 2 puffs into the lungs every 6 (six) hours as needed for wheezing or shortness of breath.     Biotin 1000 MCG CHEW Chew 1 tablet by mouth daily.     ergocalciferol (VITAMIN D2) 1.25 MG (50000 UT) capsule Take 1 capsule (50,000 Units total) by mouth once a week. 12 capsule 4   Flaxseed, Linseed, (FLAXSEED OIL) 1000 MG CAPS Take 1 capsule by mouth daily.     FLUoxetine (PROZAC) 20 MG capsule Take 20 mg by mouth daily.     gabapentin (NEURONTIN) 600 MG tablet Take 1 tablet (600 mg total) by mouth 3 (three) times daily. 360 tablet 2   levothyroxine (SYNTHROID) 75 MCG tablet Take 1 tablet (75 mcg total) by mouth every morning on an empty stomach. 90 tablet 5   Magnesium 400 MG CAPS Take 2 tablets by mouth daily.     montelukast (SINGULAIR) 10 MG tablet Take 10 mg by mouth daily.     Omega 3 1000 MG CAPS Take 1 capsule by mouth daily.     omeprazole (PRILOSEC) 20 MG capsule Take 1 capsule (20 mg total) by mouth 2  (two) times daily. (Patient taking differently: Take 20 mg by mouth daily.) 180 capsule 4   sulfaSALAzine (AZULFIDINE) 500 MG tablet Take 3 tablets (1,500 mg total) by mouth 2 (two) times daily. 540 tablet 4   valACYclovir (VALTREX) 500 MG tablet Take 500 mg by mouth daily as needed.     valsartan-hydrochlorothiazide (DIOVAN-HCT) 160-25 MG tablet Take 1 tablet by mouth daily. 90 tablet 4   Vitamin D, Ergocalciferol, (DRISDOL) 1.25 MG (50000 UNIT) CAPS capsule Take 1 capsule (50,000 Units total) by mouth once  a week. 12 capsule 0   No facility-administered medications prior to visit.     Review of Systems:   Constitutional:   No  weight loss, night sweats,  Fevers, chills, +fatigue, or  lassitude.  HEENT:   No headaches,  Difficulty swallowing,  Tooth/dental problems, or  Sore throat,                No sneezing, itching, ear ache, nasal congestion, post nasal drip,   CV:  No chest pain,  Orthopnea, PND, swelling in lower extremities, anasarca, dizziness, palpitations, syncope.   GI  No heartburn, indigestion, abdominal pain, nausea, vomiting, diarrhea, change in bowel habits, loss of appetite, bloody stools.   Resp: No shortness of breath with exertion or at rest.  No excess mucus, no productive cough,  No non-productive cough,  No coughing up of blood.  No change in color of mucus.  No wheezing.  No chest wall deformity  Skin: no rash or lesions.  GU: no dysuria, change in color of urine, no urgency or frequency.  No flank pain, no hematuria   MS: Chronic joint pain    Physical Exam  BP 134/77   Pulse 87   Ht 5\' 3"  (1.6 m)   Wt 207 lb 9.6 oz (94.2 kg)   LMP 05/12/2007   SpO2 93% Comment: room air  BMI 36.77 kg/m   GEN: A/Ox3; pleasant , NAD, well nourished    HEENT:  [place]/AT,  NOSE-clear, THROAT-clear, no lesions, no postnasal drip or exudate noted.  Class III MP airway  NECK:  Supple w/ fair ROM; no JVD; normal carotid impulses w/o bruits; no thyromegaly or nodules  palpated; no lymphadenopathy.    RESP  Clear  P & A; w/o, wheezes/ rales/ or rhonchi. no accessory muscle use, no dullness to percussion  CARD:  RRR, no m/r/g, no peripheral edema, pulses intact, no cyanosis or clubbing.  GI:   Soft & nt; nml bowel sounds; no organomegaly or masses detected.   Musco: Warm bil, no deformities or joint swelling noted.   Neuro: alert, no focal deficits noted.    Skin: Warm, no lesions or rashes    Lab Results:  CBC   Administration History     None          Latest Ref Rng & Units 11/22/2023   12:29 PM  PFT Results  FVC-Pre L 2.95   FVC-Predicted Pre % 96   FVC-Post L 2.95   FVC-Predicted Post % 97   Pre FEV1/FVC % % 88   Post FEV1/FCV % % 88   FEV1-Pre L 2.58   FEV1-Predicted Pre % 111   FEV1-Post L 2.61   DLCO uncorrected ml/min/mmHg 17.38   DLCO [facility]% % 90   DLVA Predicted % 93     No results found for: "NITRICOXIDE"      Assessment & Plan:   Assessment and Plan    Pulmonary Hypertension   Pulmonary hypertension-elevated pulmonary artery pressure on 2D echo.  Will need to repeat sleep study and if sleep apnea is still present reinitiate therapy.  Patient does have underlying autoimmune disorder with rheumatoid arthritis and is on sulfasalazine.  Will check chest x-ray.  If any interstitial process is noted would consider high-resolution CT chest to also rule out underlying interstitial process.  Low suspicion for ILD with normal PFTs and diffusing capacity.  Dyspnea -suspect is multifactorial with moderate pulmonary hypertension on echo.  PFTs are normal. Check chest x-ray today.  Sleep  Apnea   Previously told that she has severe obstructive sleep apnea sleep study will need to be repeated.  Patient says she has tried a home sleep study in the past but was unable to complete.  Will need an in lab split-night sleep study.  If she continues to have sleep apnea would definitely recommend treatment with CPAP therapy most  likely.  She prefers an in-lab sleep study over a home study. Discuss potential treatment options, including CPAP and the Inspire device, Order an in-lab sleep study to assess the current status of sleep apnea and discuss treatment options post-study.  Asthma   Asthma is managed with an albuterol inhaler. Symptoms may have been exacerbated by beta blocker use, which has been discontinued.  PFTs showed normal lung function.  Continue the albuterol inhaler as needed and consider Zyrtec at bedtime for allergy symptoms.  Allergies   Possible allergies may contribute to respiratory symptoms. She is currently taking Singulair (montelukast) and reports no current allergy symptoms. Consider Zyrtec at bedtime for additional allergy symptom control if needed. Continue Singulair.  Rheumatoid Arthritis   Chronic rheumatoid arthritis is managed with sulfasalazine since 2019. There is potential for lung involvement due to rheumatoid arthritis or its treatment, though the lung function test was normal. Consider a CT scan if the chest x-ray shows abnormalities to further evaluate for lung inflammation related to rheumatoid arthritis or its treatment.  Follow-up   She plans to travel to [place] for a couple of months, which may affect follow-up scheduling. Emphasize the importance of completing the sleep study and chest x-ray before travel to address pulmonary hypertension and sleep apnea concerns. Follow up in six weeks, schedule permitting, and coordinate the sleep study and chest x-ray before travel if possible.        Rubye Oaks, NP 11/26/2023

## 2023-11-29 ENCOUNTER — Telehealth: Payer: Self-pay | Admitting: Family Medicine

## 2023-11-29 NOTE — Telephone Encounter (Signed)
 She was ret your call for Sleep study. Please try again. [phone]

## 2023-11-30 NOTE — Telephone Encounter (Signed)
 NFN

## 2023-12-03 ENCOUNTER — Telehealth: Payer: Self-pay | Admitting: Adult Health

## 2023-12-03 NOTE — Telephone Encounter (Signed)
 Patient would like to schedule sleep study. Patient phone number is [phone].

## 2023-12-05 DIAGNOSIS — R413 Other amnesia: Secondary | ICD-10-CM | POA: Diagnosis not present

## 2023-12-10 ENCOUNTER — Telehealth (INDEPENDENT_AMBULATORY_CARE_PROVIDER_SITE_OTHER): Payer: Self-pay | Admitting: Audiology

## 2023-12-10 ENCOUNTER — Encounter: Payer: Self-pay | Admitting: Podiatry

## 2023-12-10 ENCOUNTER — Ambulatory Visit: Admitting: Podiatry

## 2023-12-10 ENCOUNTER — Encounter (INDEPENDENT_AMBULATORY_CARE_PROVIDER_SITE_OTHER): Payer: Self-pay

## 2023-12-10 ENCOUNTER — Telehealth (INDEPENDENT_AMBULATORY_CARE_PROVIDER_SITE_OTHER): Payer: Self-pay | Admitting: Physician Assistant

## 2023-12-10 VITALS — Ht 63.0 in | Wt 207.0 lb

## 2023-12-10 DIAGNOSIS — M899 Disorder of bone, unspecified: Secondary | ICD-10-CM | POA: Diagnosis not present

## 2023-12-10 DIAGNOSIS — M949 Disorder of cartilage, unspecified: Secondary | ICD-10-CM

## 2023-12-10 DIAGNOSIS — M7750 Other enthesopathy of unspecified foot: Secondary | ICD-10-CM | POA: Diagnosis not present

## 2023-12-10 DIAGNOSIS — M792 Neuralgia and neuritis, unspecified: Secondary | ICD-10-CM

## 2023-12-10 NOTE — Telephone Encounter (Signed)
 Reminder Call:  Left voicemail and MyChart message with time and location-[address] [place], [place] [address]

## 2023-12-10 NOTE — Progress Notes (Signed)
 Subjective: Chief Complaint  Patient presents with   Ankle Pain    " My left ankle is about the same and I think my right ankle may be starting to get worse"      68 year old female presents the office with above concerns.  She states that she started get pain in the right ankle just as she gets on the left side.  She states that her ankles feel "raw" at times.  Recurrence more of the numbness, burning to her ankles and feet.  She says overall the pain is examined her ankle is better on the left side however more place this other sensation.  She is not back problems since she was 68 years old.  She recently follow-up with orthopedics for her back.  She is hesitant to do a steroid injection.  She states that she has had several.   Objective: AAO x3, NAD DP/PT pulses palpable bilaterally, CRT less than 3 seconds Some discomfort along the anterior ankle joint but mostly more along the flexor tendons.  She is also starting her symptoms coming up the leg and started on the right side as well but mostly on the left side.  She is a negative Tinel sign today.  There is no area pinpoint tenderness.  There is no edema is no erythema.  MMT 5/5. No pain with calf compression, swelling, warmth, erythema  Assessment: 68 year old female with left chronic ankle pain, neuritis symptoms  Plan: -All treatment options discussed with the patient including all alternatives, risks, complications.  -Although she does have osteochondral lesions and some tendinitis present her ankle itself is feeling better but more it seems to be nerve related.  She has not had an injection in her back.  She will have a second opinion.  Referral placed for [facility].  States that she residual spinal stenosis now she is states that she was told differently.  She states that she has difficulty shopping and going to grocery store because of pain in her back that radiates down her legs. -Patient encouraged to call  the office with any questions, concerns, change in symptoms.   Vivi Barrack DPM

## 2023-12-11 ENCOUNTER — Encounter (INDEPENDENT_AMBULATORY_CARE_PROVIDER_SITE_OTHER): Payer: Self-pay

## 2023-12-11 ENCOUNTER — Ambulatory Visit (INDEPENDENT_AMBULATORY_CARE_PROVIDER_SITE_OTHER): Admitting: Physician Assistant

## 2023-12-11 ENCOUNTER — Ambulatory Visit (INDEPENDENT_AMBULATORY_CARE_PROVIDER_SITE_OTHER): Admitting: Audiology

## 2023-12-11 VITALS — BP 142/82 | HR 65 | Ht 63.0 in | Wt 200.0 lb

## 2023-12-11 DIAGNOSIS — H9201 Otalgia, right ear: Secondary | ICD-10-CM | POA: Diagnosis not present

## 2023-12-11 DIAGNOSIS — H903 Sensorineural hearing loss, bilateral: Secondary | ICD-10-CM

## 2023-12-11 MED ORDER — FLUTICASONE PROPIONATE 50 MCG/ACT NA SUSP: 2.0000 | Freq: Every day | NASAL | 6 refills | Status: DC

## 2023-12-11 NOTE — Progress Notes (Unsigned)
  [address], [address] [place], [place] [address] [phone]  Audiological Evaluation    Name: Marie Farmer     DOB:   [birth year]      MRN:   [id number]                                                                                     Service Date: 12/11/2023     Accompanied by: unaccompanied   Patient comes today after Dr. Allena Katz, ENT sent a referral for a hearing evaluation due to concerns with hearing loss.   Symptoms Yes Details  Hearing loss  [x]  Her sister says that she thinks [name] has hearing loss ( TV volume )  Tinnitus  [x]  Since 6 weeks ago -sloshing sound  in both ears  Ear pain/ infections/pressure  [x]  Ear pain in the morning, worse in the right ear  Balance problems  [x]  Balance  issues when she stands up in the morning or in the middle of the night  Noise exposure history  [x]  Worked in [employer]   Previous ear surgeries  []    Family history of hearing loss  []    Amplification  []    Other  []      Otoscopy: Right ear: Clear external ear canals and notable landmarks visualized on the tympanic membrane. Left ear:  Clear external ear canals and notable landmarks visualized on the tympanic membrane.  Tympanometry: Right ear: Type A- Normal external ear canal volume with normal middle ear pressure and tympanic membrane compliance Left ear: Type A- Normal external ear canal volume with normal middle ear pressure and tympanic membrane compliance    Pure tone Audiometry: Both ears- Normal hearing from  250 Hz -6000 Hz, then mild presumably sensorineural hearing loss at 8000 Hz.   Speech Audiometry: Right ear- Speech Reception Threshold (SRT) was obtained at 15 dBHL. Left ear-Speech Reception Threshold (SRT) was obtained at 10 dBHL.   Word Recognition Score Tested using NU-6 (MLV) Right ear: 100% was obtained at a presentation level of 55 dBHL with contralateral masking which is deemed as  excellent. Left ear: 100% was obtained at a presentation level of  55 dBHL with contralateral masking which is deemed as  excellent.   The hearing test results were completed under headphones and results are deemed to be of good reliability. Test technique:  conventional      Recommendations: Follow up with ENT as scheduled for today. Return for a hearing evaluation if concerns with hearing changes arise or per MD recommendation. Recommend using a sound generator , if needed to manage her tinnitus. She may also consider going to [facility].   [name] MARIE LEROUX-MARTINEZ, AUD

## 2023-12-11 NOTE — Progress Notes (Signed)
 Dear Dr. Sherril Croon, Here is my assessment for our mutual patient, Marie Farmer. Thank you for allowing me the opportunity to care for your patient. Please do not hesitate to contact me should you have any other questions. Sincerely, Burna Forts PA-C  Otolaryngology Clinic Note Referring provider: Dr. Sherril Croon HPI:  Marie Farmer is a 68 y.o. female kindly referred by Dr. Sherril Croon   The patient is a 68 year old female seen in our office for evaluation of right sided ear pain.  The patient notes that the symptoms started approximately 6 weeks ago.  She notes that she feels like the pain radiates into her neck.  She describes this as pressure-like, occasional sharp pain.  She reports that it does not seem to fluctuate, she attempts to pop your ears which does not help.  No clicking or popping, no acute associated dizziness.  Occasionally she feels an ocean sound in her ears, this is not rhythmic and not high-pitched.  She does feel like the symptoms are slightly worse with time.  She notes preceding this she has had some difficulty at night with sleeping.  She notes that when she lays down at night and is flat she feels congestion in her entire head and sinuses, she started using a wedge pillow and notes that once she props up her head the symptoms resolve.  She denies any significant nasal congestion or pressure during the day.  She does note that she has been recently battling with hypertension and is followed by several specialist for this.  She denies any preceding ear history including head or neck cancer, head or neck surgery, no history recurrent ear infections, no history of hearing loss.  No trauma.  She does report a history of allergic rhinitis for which she was taking Singulair.  She previously had been seen by allergist and did shots for approximately 10 years but no longer wants to continue with the injections.      Independent Review of Additional Tests or Records:    Audiological evaluation on  12/11/2023    PMH/Meds/All/SocHx/FamHx/ROS:   Past Medical History:  Diagnosis Date   Anxiety    Asthma    DDD (degenerative disc disease)    chronic back pain   Fibromyalgia    Foot pain    GERD (gastroesophageal reflux disease)    Hypertension    IBS (irritable bowel syndrome)    Mood disorder (HCC)    PONV (postoperative nausea and vomiting)    Sleep apnea    Venous insufficiency    Wears glasses      Past Surgical History:  Procedure Laterality Date   CARPAL TUNNEL RELEASE Right 06/10/2013   Procedure: CARPAL TUNNEL RELEASE;  Surgeon: Nicki Reaper, MD;  Location: [facility];  Service: Orthopedics;  Laterality: Right;   CHOLECYSTECTOMY N/A 08/19/2021   Procedure: LAPAROSCOPIC CHOLECYSTECTOMY;  Surgeon: Lucretia Roers, MD;  Location: [facility];  Service: General;  Laterality: N/A;   COLONOSCOPY  12/20/2011   [name]-single diminutive sessile polyp in the sigmoid colon/small interenal hemorrhoids   COLONOSCOPY N/A 11/28/2017   Procedure: COLONOSCOPY;  Surgeon: Corbin Ade, MD;  Location: [facility];  Service: Endoscopy;  Laterality: N/A;  10:30   COLONOSCOPY WITH PROPOFOL N/A 07/07/2021   one 9 mm polyp in rectum s/p removal (serrated adenoma). surveillance in 5 years   ESOPHAGOGASTRODUODENOSCOPY  04/10/2003   Normal esophagus/Antral erosions, as described above.  The remainder of the stomach appeared normal.  Normal first and second parts  of the duodenum.   ESOPHAGOGASTRODUODENOSCOPY  12/20/2011   [name]-hiatus hernia/chronic gastritis/normal duodenumNEGATIVE H pylori, negative SB biopsy   POLYPECTOMY  07/07/2021   Procedure: POLYPECTOMY;  Surgeon: Corbin Ade, MD;  Location: [facility];  Service: Endoscopy;;   TIBIA FRACTURE SURGERY  09/11/2010   right    Family History  Problem Relation Age of Onset   Diabetes Mother    Coronary artery disease Mother    COPD Mother    Colon polyps Mother        age 42   Lung cancer Father     Breast cancer Neg Hx      Social Connections: Not on file      Current Outpatient Medications:    acetaminophen (TYLENOL) 500 MG tablet, Take 1,000 mg by mouth every 6 (six) hours as needed for moderate pain., Disp: , Rfl:    albuterol (VENTOLIN HFA) 108 (90 Base) MCG/ACT inhaler, Inhale 2 puffs into the lungs every 6 (six) hours as needed for wheezing or shortness of breath., Disp: , Rfl:    Biotin 1000 MCG CHEW, Chew 1 tablet by mouth daily., Disp: , Rfl:    ergocalciferol (VITAMIN D2) 1.25 MG (50000 UT) capsule, Take 1 capsule (50,000 Units total) by mouth once a week., Disp: 12 capsule, Rfl: 4   Flaxseed, Linseed, (FLAXSEED OIL) 1000 MG CAPS, Take 1 capsule by mouth daily., Disp: , Rfl:    FLUoxetine (PROZAC) 20 MG capsule, Take 20 mg by mouth daily., Disp: , Rfl:    gabapentin (NEURONTIN) 600 MG tablet, Take 1 tablet (600 mg total) by mouth 3 (three) times daily., Disp: 360 tablet, Rfl: 2   levothyroxine (SYNTHROID) 75 MCG tablet, Take 1 tablet (75 mcg total) by mouth every morning on an empty stomach., Disp: 90 tablet, Rfl: 5   Magnesium 400 MG CAPS, Take 2 tablets by mouth daily., Disp: , Rfl:    montelukast (SINGULAIR) 10 MG tablet, Take 10 mg by mouth daily., Disp: , Rfl:    Omega 3 1000 MG CAPS, Take 1 capsule by mouth daily., Disp: , Rfl:    omeprazole (PRILOSEC) 20 MG capsule, Take 1 capsule (20 mg total) by mouth 2 (two) times daily. (Patient taking differently: Take 20 mg by mouth daily.), Disp: 180 capsule, Rfl: 4   sulfaSALAzine (AZULFIDINE) 500 MG tablet, Take 3 tablets (1,500 mg total) by mouth 2 (two) times daily., Disp: 540 tablet, Rfl: 4   valACYclovir (VALTREX) 500 MG tablet, Take 500 mg by mouth daily as needed., Disp: , Rfl:    valsartan-hydrochlorothiazide (DIOVAN-HCT) 160-25 MG tablet, Take 1 tablet by mouth daily., Disp: 90 tablet, Rfl: 4   Vitamin D, Ergocalciferol, (DRISDOL) 1.25 MG (50000 UNIT) CAPS capsule, Take 1 capsule (50,000 Units total) by mouth once a  week., Disp: 12 capsule, Rfl: 0   Physical Exam:   LMP 05/12/2007   Pertinent Findings  CN II-XII intact  Bilateral EAC clear and TM intact with well pneumatized middle ear spaces, no pain with manipulation of the ear, no pain with palpation of paracervical or neck musculature Weber 512: equal Rinne 512: AC > BC b/l  Anterior rhinoscopy: Septum midline; bilateral inferior turbinates with no hypertrophy No lesions of oral cavity/oropharynx; dentition within normal limits No obviously palpable neck masses/lymphadenopathy/thyromegaly No respiratory distress or stridor  Seprately Identifiable Procedures:  None  Impression & Plans:  Zakya Halabi is a 68 y.o. female with the following   Ear pain-  The patient presents today with right  sided ear pain.  Unclear etiology at this time, I do have some suspicion for eustachian tube dysfunction.  Her audiological exam revealed no significant hearing deficits, type A tympanometry.  I discussed the treatment options including medical management and attempting the use of Flonase and antihistamine.  The patient would like to proceed with medical management at this time.  I discussed that if her symptoms do not improve over the next 2-3 months I would recommend further imaging, she is happy with waiting on the imaging at this time.  She understands that if she develops any new or worsening signs or symptoms she will need to follow-up immediately.  Will plan for nasal endoscopy if her symptoms persist at the next office visit.   - f/u 2-3 months    Thank you for allowing me the opportunity to care for your patient. Please do not hesitate to contact me should you have any other questions.  Sincerely, Burna Forts PA-C [facility] Phone: [phone] Fax: [phone]  12/11/2023, 8:45 AM

## 2023-12-12 ENCOUNTER — Telehealth: Payer: Self-pay

## 2023-12-12 NOTE — Telephone Encounter (Signed)
 Referral, office notes and demographics faxed to [facility]

## 2023-12-12 NOTE — Telephone Encounter (Signed)
-----   Message from Vivi Barrack sent at 12/10/2023  6:16 PM EDT ----- Can you please fax referral to [facility]?  Thank you.

## 2023-12-13 DIAGNOSIS — I10 Essential (primary) hypertension: Secondary | ICD-10-CM | POA: Diagnosis not present

## 2023-12-13 DIAGNOSIS — E559 Vitamin D deficiency, unspecified: Secondary | ICD-10-CM | POA: Diagnosis not present

## 2023-12-13 DIAGNOSIS — E039 Hypothyroidism, unspecified: Secondary | ICD-10-CM | POA: Diagnosis not present

## 2023-12-17 DIAGNOSIS — D649 Anemia, unspecified: Secondary | ICD-10-CM | POA: Diagnosis not present

## 2023-12-17 DIAGNOSIS — E039 Hypothyroidism, unspecified: Secondary | ICD-10-CM | POA: Diagnosis not present

## 2023-12-17 DIAGNOSIS — E278 Other specified disorders of adrenal gland: Secondary | ICD-10-CM | POA: Diagnosis not present

## 2023-12-17 DIAGNOSIS — E041 Nontoxic single thyroid nodule: Secondary | ICD-10-CM | POA: Diagnosis not present

## 2023-12-17 DIAGNOSIS — M810 Age-related osteoporosis without current pathological fracture: Secondary | ICD-10-CM | POA: Diagnosis not present

## 2023-12-17 DIAGNOSIS — I10 Essential (primary) hypertension: Secondary | ICD-10-CM | POA: Diagnosis not present

## 2023-12-18 ENCOUNTER — Institutional Professional Consult (permissible substitution): Admitting: Internal Medicine

## 2023-12-19 ENCOUNTER — Other Ambulatory Visit: Payer: Self-pay | Admitting: *Deleted

## 2023-12-19 DIAGNOSIS — R9389 Abnormal findings on diagnostic imaging of other specified body structures: Secondary | ICD-10-CM

## 2023-12-19 NOTE — Progress Notes (Signed)
 Called and spoke with patient, provide results/recommendations per Rubye Oaks NP.  She verbalized understanding.  Advised we would place the order for the CT scan and she will be contacted by one of our PCCs to schedule it.  Nothing further needed.

## 2023-12-29 DIAGNOSIS — R052 Subacute cough: Secondary | ICD-10-CM | POA: Diagnosis not present

## 2023-12-29 DIAGNOSIS — J019 Acute sinusitis, unspecified: Secondary | ICD-10-CM | POA: Diagnosis not present

## 2024-01-02 ENCOUNTER — Ambulatory Visit (HOSPITAL_COMMUNITY)

## 2024-01-03 ENCOUNTER — Encounter: Payer: Self-pay | Admitting: *Deleted

## 2024-01-04 ENCOUNTER — Ambulatory Visit: Admitting: Adult Health

## 2024-01-07 ENCOUNTER — Encounter (HOSPITAL_BASED_OUTPATIENT_CLINIC_OR_DEPARTMENT_OTHER): Admitting: Pulmonary Disease

## 2024-01-10 ENCOUNTER — Other Ambulatory Visit (INDEPENDENT_AMBULATORY_CARE_PROVIDER_SITE_OTHER): Payer: Self-pay | Admitting: Physician Assistant

## 2024-01-13 DIAGNOSIS — H9201 Otalgia, right ear: Secondary | ICD-10-CM | POA: Diagnosis not present

## 2024-01-13 DIAGNOSIS — R0981 Nasal congestion: Secondary | ICD-10-CM | POA: Diagnosis not present

## 2024-01-13 DIAGNOSIS — Z0000 Encounter for general adult medical examination without abnormal findings: Secondary | ICD-10-CM | POA: Diagnosis not present

## 2024-01-16 ENCOUNTER — Encounter: Payer: Self-pay | Admitting: *Deleted

## 2024-01-16 ENCOUNTER — Telehealth: Payer: Self-pay | Admitting: *Deleted

## 2024-01-16 NOTE — Telephone Encounter (Signed)
 ATC patient regarding her appointment tomorrow.  LVM to return call.  When she returns call, please cancel her appointment for 01/16/2024 because she does not have her split night sleep study is not scheduled until 6/25, CT scan is also scheduled for the same day.  She needs to be rescheduled for 1-2 weeks after her sleep study is complete.

## 2024-01-17 ENCOUNTER — Ambulatory Visit: Admitting: Adult Health

## 2024-01-28 DIAGNOSIS — K219 Gastro-esophageal reflux disease without esophagitis: Secondary | ICD-10-CM | POA: Diagnosis not present

## 2024-01-28 DIAGNOSIS — I10 Essential (primary) hypertension: Secondary | ICD-10-CM | POA: Diagnosis not present

## 2024-01-28 DIAGNOSIS — Z888 Allergy status to other drugs, medicaments and biological substances status: Secondary | ICD-10-CM | POA: Diagnosis not present

## 2024-01-28 DIAGNOSIS — Z91048 Other nonmedicinal substance allergy status: Secondary | ICD-10-CM | POA: Diagnosis not present

## 2024-01-28 DIAGNOSIS — M722 Plantar fascial fibromatosis: Secondary | ICD-10-CM | POA: Diagnosis not present

## 2024-01-28 DIAGNOSIS — M069 Rheumatoid arthritis, unspecified: Secondary | ICD-10-CM | POA: Diagnosis not present

## 2024-01-31 ENCOUNTER — Other Ambulatory Visit: Payer: Self-pay | Admitting: Endocrinology

## 2024-01-31 DIAGNOSIS — E041 Nontoxic single thyroid nodule: Secondary | ICD-10-CM

## 2024-02-12 DIAGNOSIS — R0981 Nasal congestion: Secondary | ICD-10-CM | POA: Diagnosis not present

## 2024-02-12 DIAGNOSIS — R4181 Age-related cognitive decline: Secondary | ICD-10-CM | POA: Diagnosis not present

## 2024-02-21 ENCOUNTER — Other Ambulatory Visit

## 2024-02-28 ENCOUNTER — Encounter (HOSPITAL_BASED_OUTPATIENT_CLINIC_OR_DEPARTMENT_OTHER): Admitting: Pulmonary Disease

## 2024-03-03 ENCOUNTER — Ambulatory Visit: Payer: HMO | Admitting: Internal Medicine

## 2024-03-05 ENCOUNTER — Ambulatory Visit (HOSPITAL_COMMUNITY)

## 2024-03-05 ENCOUNTER — Ambulatory Visit (HOSPITAL_BASED_OUTPATIENT_CLINIC_OR_DEPARTMENT_OTHER): Attending: Adult Health | Admitting: Pulmonary Disease

## 2024-03-05 DIAGNOSIS — G4733 Obstructive sleep apnea (adult) (pediatric): Secondary | ICD-10-CM | POA: Diagnosis not present

## 2024-03-10 DIAGNOSIS — Z6835 Body mass index (BMI) 35.0-35.9, adult: Secondary | ICD-10-CM | POA: Diagnosis not present

## 2024-03-10 DIAGNOSIS — M549 Dorsalgia, unspecified: Secondary | ICD-10-CM | POA: Diagnosis not present

## 2024-03-10 DIAGNOSIS — M47816 Spondylosis without myelopathy or radiculopathy, lumbar region: Secondary | ICD-10-CM | POA: Diagnosis not present

## 2024-03-11 ENCOUNTER — Ambulatory Visit
Admission: RE | Admit: 2024-03-11 | Discharge: 2024-03-11 | Disposition: A | Discharge status: Home / Self Care | Source: Ambulatory Visit | Attending: Endocrinology | Admitting: Endocrinology

## 2024-03-11 ENCOUNTER — Ambulatory Visit: Admitting: Podiatry

## 2024-03-11 ENCOUNTER — Ambulatory Visit (INDEPENDENT_AMBULATORY_CARE_PROVIDER_SITE_OTHER)

## 2024-03-11 DIAGNOSIS — M7750 Other enthesopathy of unspecified foot: Secondary | ICD-10-CM

## 2024-03-11 DIAGNOSIS — E042 Nontoxic multinodular goiter: Secondary | ICD-10-CM | POA: Diagnosis not present

## 2024-03-11 DIAGNOSIS — M899 Disorder of bone, unspecified: Secondary | ICD-10-CM

## 2024-03-11 DIAGNOSIS — E041 Nontoxic single thyroid nodule: Secondary | ICD-10-CM

## 2024-03-11 DIAGNOSIS — R52 Pain, unspecified: Secondary | ICD-10-CM | POA: Diagnosis not present

## 2024-03-11 DIAGNOSIS — M7731 Calcaneal spur, right foot: Secondary | ICD-10-CM

## 2024-03-11 DIAGNOSIS — M778 Other enthesopathies, not elsewhere classified: Secondary | ICD-10-CM

## 2024-03-11 DIAGNOSIS — M949 Disorder of cartilage, unspecified: Secondary | ICD-10-CM

## 2024-03-11 DIAGNOSIS — M7751 Other enthesopathy of right foot: Secondary | ICD-10-CM

## 2024-03-11 NOTE — Progress Notes (Signed)
 Subjective: Chief Complaint  Patient presents with   Foot Pain    RM#11 Right foot pain started 01/29/2024 was [place] received injection which gave her relief but states foot is hurting on the outside of foot previous surgery on right foot 2008.    68 year old female presents the office with for new concerns of pain to the right foot.  States that she was driving to [place]  and she was having pain.  She went to see a provider and she was given a injection which did help when she was diagnosed with Achilles tendinitis.  She does not recall any injuries and no swelling.  She states that at the time of the pain she was getting pain on the Achilles but also with the ball of her foot.  Now most of the pain to the lateral aspect point of the fifth metatarsal base area.  She also states that she been doing some stretching, rehab exercises for the left ankle as she gets tightness.   Objective: AAO x3, NAD DP/PT pulses palpable bilaterally, CRT less than 3 seconds On the right foot there is mild tenderness palpation on the distal portion of the Achilles tendon.  Clinically the tendon appears to be intact.  There is no pain with lateral compression of the calcaneus.  There is no edema, erythema to this area.  Slight discomfort submetatarsal 3 area but no area pinpoint tenderness.  Majority discomfort is on the fifth metatarsal base on the insertion of the peroneal tendon.  Clinically the tendon appears to be intact.  On the left side she does have this tenderness on the course of the flexor tendons, tightness.  Not able to appreciate any area of pinpoint tenderness.  No pain with calf compression, swelling, warmth, erythema  Assessment: Insertional peroneal tendinitis right side; Achilles tendinitis; capsulitis  Plan: Right foot pain, new complaint of pain -X-rays obtained reviewed the right foot.  3 views were obtained.  Hardware intact from prior surgery.  Minimal calcaneal spurring is present  posteriorly.  X-ray changes present of the third MTPJ as well as along the midfoot. -Patient was to proceed with a steroid injection..  Mixture of 0.5 cc of Marcaine  plain, 0.5 cc of dexamethasone  phosphate was without any complications.  Postinjection care discussed.  Tolerated well. -Referral to physical therapy  Left ankle tendonitis -Referral to PT   No follow-ups on file.  Marie Farmer DPM

## 2024-03-11 NOTE — Patient Instructions (Signed)

## 2024-03-12 NOTE — Procedures (Signed)
 [facility] [address] [place], [place] [address] Tel: [phone]   Fax: [phone]  Split Night Interpretation  Patient Name:  Marie Farmer, Marie Farmer Study Date:  03/05/2024 Referring Physician:  Madelin RAMAN. Parrett, Np  Indications for Polysomnography The patient is a 68 year old Female who is 5' 3 and weighs 200.0 lbs.  Her BMI equals 35.4.  A diagnostic polysomnogram was performed to evaluate for reassessment of OSA.  After 126.0 minutes of sleep time the patient exhibited sufficient respiratory events qualifying her for a CPAP trial which was then initiated.    Medications taken at 2125.  TIZANIDINE   SULFASALAZINE   GABAPENTIN   MONTELUKAST   Polysomnogram Data A full night polysomnogram was performed recording the standard physiologic parameters including EEG, EOG, EMG, EKG, nasal and oral airflow.  Respiratory parameters of chest and abdominal movements are recorded with Peizo-Crystal motion transducers.  Oxygen saturation was recorded by pulse oximetry.    Sleep Architecture The total recording time of the diagnostic portion of the study was 154.7 minutes.  The total sleep time was 126.0 minutes.  During the diagnostic portion of the study, the patient spent 10.7% of total sleep time in Stage N1, 52.4% in Stage N2, 36.9% in Stages N3, and 0.0% in REM.   Sleep latency was 17.1 minutes.  REM latency was - minutes.  Sleep Efficiency was 81.5%.  Wake after Sleep Onset time was 11.5 minutes.   At 12:41:47 AM the patient was placed on PAP treatment and was titrated at pressures ranging from 5* cm/H20 with supplemental oxygen at - up to 14* cm/H20 with supplemental oxygen at -.  The total recording time of the treatment portion of the study was 285.3 minutes.  The total sleep time was 237.5 minutes.  During the treatment portion of the study, the patient spent 13.7% of total sleep time in Stage N1, 48.4% in Stage N2, 15.4% in Stages N3, and 22.5% in REM.    Sleep latency was 1.0 minutes.  REM latency was 7.5 minutes.  Sleep Efficiency was 83.3%.  Wake after Sleep Onset time was 46.5 minutes.  Respiratory Events During the diagnostic portion of the study, the polysomnogram revealed a presence of - obstructive, - central, and - mixed apneas resulting in an Apnea index of - events per hour.  There were 53 hypopneas (>=3% desaturation and/or arousal) resulting in an Apnea\Hypopnea Index (AHI >=3% desaturation and/or arousal) of 25.2 events per hour.  There were 37 hypopneas (>=4% desaturation) resulting in an Apnea\Hypopnea Index (AHI >=4% desaturation) of 17.6 events per hour.  There were 18 Respiratory Effort Related Arousals resulting in a RERA index of 8.6 events per hour. The Respiratory Disturbance Index is 33.8 events per hour.  The snore index was - events per hour.  Mean oxygen saturation was 92.6%.  The lowest oxygen saturation during sleep was 81.0%.  Time spent <=88% oxygen saturation was 2.5 minutes (1.6%).  During the treatment portion of the study, the polysomnogram revealed a presence of 1 obstructive, 2 centrals, and - mixed apneas resulting in an Apnea index of 0.8 events per hour.  There were 27 hypopneas (>=3% desaturation and/or arousal) resulting in an Apnea\Hypopnea Index (AHI >=3% desaturation and/or arousal) of 7.6 events per hour.  There were 15 hypopneas (>=4% desaturation) resulting in an Apnea\Hypopnea Index (AHI >=4% desaturation) of 4.5 events per hour.  There were 12 Respiratory Effort Related Arousals resulting in a RERA index of 3.0 events per hour. The Respiratory Disturbance Index is  10.6 events per hour.  The snore index was - events per hour.  Mean oxygen saturation was 94.6%.  The lowest oxygen saturation during sleep was 85.0%.  Time spent <=88% oxygen saturation was 0.5 minutes (0.2%).  Limb Activity During the diagnostic portion of the study, there were 12 limb movements recorded.  Of this total, 12 were classified as PLMs.   Of the PLMs, - were associated with arousals.  The Limb Movement index was 5.7 per hour while the PLM index was 5.7 per hour.  During the treatment portion of the study, there were 4 limb movements recorded.  Of this total, 4 were classified as PLMs.  Of the PLMs, - were associated with arousals.  The Limb Movement index was 1.0 per hour while the PLM index was 1.0 per hour.  Cardiac Summary During the diagnostic portion of the study, the average pulse rate was 63.8 bpm.  The minimum pulse rate was 55.0 bpm while the maximum pulse rate was 81.0 bpm.  During the treatment portion of the study, the average pulse rate was 60.3 bpm.  The minimum pulse rate was 51.0 bpm while the maximum pulse rate was 85.0 bpm.   Diagnosis: Moderate -severe OSA, predominant hypopneas & RERAs This was corrected by CPAP 14 cm Titration was optimal with REM sleep noted at final level, but no supine sleep  Recommendations: CPAP 14 cm with medium fullface mask can be initiated Weight loss encouraged or maintain ideal body weight. Patient should be cautioned against driving when sleepy and should avoid medication with sedative side effects.    This study was personally reviewed and electronically signed by: Dr. Harden Staff Accredited Board Certified in Sleep Medicine

## 2024-03-15 ENCOUNTER — Ambulatory Visit (HOSPITAL_COMMUNITY)
Admission: RE | Admit: 2024-03-15 | Discharge: 2024-03-15 | Disposition: A | Discharge status: Home / Self Care | Source: Ambulatory Visit | Attending: Adult Health | Admitting: Adult Health

## 2024-03-15 DIAGNOSIS — M0510 Rheumatoid lung disease with rheumatoid arthritis of unspecified site: Secondary | ICD-10-CM | POA: Diagnosis not present

## 2024-03-15 DIAGNOSIS — R9389 Abnormal findings on diagnostic imaging of other specified body structures: Secondary | ICD-10-CM | POA: Insufficient documentation

## 2024-03-15 DIAGNOSIS — E042 Nontoxic multinodular goiter: Secondary | ICD-10-CM | POA: Diagnosis not present

## 2024-03-17 ENCOUNTER — Ambulatory Visit: Admitting: Podiatry

## 2024-03-17 DIAGNOSIS — M722 Plantar fascial fibromatosis: Secondary | ICD-10-CM

## 2024-03-17 DIAGNOSIS — M792 Neuralgia and neuritis, unspecified: Secondary | ICD-10-CM | POA: Diagnosis not present

## 2024-03-17 DIAGNOSIS — M7750 Other enthesopathy of unspecified foot: Secondary | ICD-10-CM

## 2024-03-17 DIAGNOSIS — M76821 Posterior tibial tendinitis, right leg: Secondary | ICD-10-CM

## 2024-03-17 MED ORDER — METHYLPREDNISOLONE 4 MG PO TBPK
ORAL_TABLET | ORAL | 0 refills | Status: DC
Start: 2024-03-17 — End: 2024-06-05

## 2024-03-17 NOTE — Progress Notes (Unsigned)
 Chief Complaint  Patient presents with   foot pain    Right foot is very painful in the center of the ball of her foot. Similar to the issue back in May. Difficult to drive.    HPI: 68 y.o. female presenting today with c/o significant pain around the medial aspect of her rear foot that occurs when driving.  She also notes pain in the ball of the foot when driving for prolonged periods of time.  This is in the right foot.  She states that she saw Dr. Alona previously and had gotten a cortisone injection.  She requests 1 today but states that she is not having much pain today.  She points to several areas of her feet that have been painful when they were symptomatic.  She does have allergy  noted to prednisone, but states that she can get cortisone injections and that she can take oral Medrol  Dosepaks without side effects.  States that she is scheduled to start physical therapy on 03/24/2024 at [facility] location.  Past Medical History:  Diagnosis Date   Anxiety    Asthma    DDD (degenerative disc disease)    chronic back pain   Fibromyalgia    Foot pain    GERD (gastroesophageal reflux disease)    Hypertension    IBS (irritable bowel syndrome)    Mood disorder (HCC)    PONV (postoperative nausea and vomiting)    Sleep apnea    Venous insufficiency    Wears glasses    Past Surgical History:  Procedure Laterality Date   CARPAL TUNNEL RELEASE Right 06/10/2013   Procedure: CARPAL TUNNEL RELEASE;  Surgeon: Arley JONELLE Curia, MD;  Location: [facility];  Service: Orthopedics;  Laterality: Right;   CHOLECYSTECTOMY N/A 08/19/2021   Procedure: LAPAROSCOPIC CHOLECYSTECTOMY;  Surgeon: Kallie Manuelita BROCKS, MD;  Location: [facility];  Service: General;  Laterality: N/A;   COLONOSCOPY  12/20/2011   [name]-single diminutive sessile polyp in the sigmoid colon/small interenal hemorrhoids   COLONOSCOPY N/A 11/28/2017   Procedure: COLONOSCOPY;  Surgeon: Shaaron Lamar HERO, MD;  Location: [facility];  Service: Endoscopy;  Laterality: N/A;  10:30   COLONOSCOPY WITH PROPOFOL  N/A 07/07/2021   one 9 mm polyp in rectum s/p removal (serrated adenoma). surveillance in 5 years   ESOPHAGOGASTRODUODENOSCOPY  04/10/2003   Normal esophagus/Antral erosions, as described above.  The remainder of the stomach appeared normal.  Normal first and second parts of the duodenum.   ESOPHAGOGASTRODUODENOSCOPY  12/20/2011   [name]-hiatus hernia/chronic gastritis/normal duodenumNEGATIVE H pylori, negative SB biopsy   POLYPECTOMY  07/07/2021   Procedure: POLYPECTOMY;  Surgeon: Shaaron Lamar HERO, MD;  Location: [facility];  Service: Endoscopy;;   TIBIA FRACTURE SURGERY  09/11/2010   right   Allergies  Allergen Reactions   Ace Inhibitors Shortness Of Breath   Beta Adrenergic Blockers Shortness Of Breath   Adhesive [Tape]     Peels skin   Cymbalta [Duloxetine Hcl]     Gums red, mouth felt hout   Methylprednisolone      Redness all over   Prednisone Other (See Comments)    Redness all over   Azithromycin Rash   Latex Rash    Sensitive skin, causes redness    Nabumetone Rash and Other (See Comments)    lips swollen       Physical Exam: General: The patient is alert and oriented x3 in no acute distress.  Dermatology:  No ecchymosis, erythema, or edema bilateral.  No open lesions.    Vascular: Palpable pedal pulses bilaterally. Capillary refill within normal limits.  No appreciable edema.    Neurological: Epicritic sensation is intact  Musculoskeletal Exam:  There is minimal diffuse pain on palpation of the plantarmedial & plantarcentral aspect of right heel.  There is also minimal pain on palpation of the posterior tibial tendon along the distal course near the insertion into the navicular.  No gaps or nodules within the plantar fascia.  Positive Windlass mechanism bilateral.  No antalgic gait noted with first few steps upon standing.  No pain on palpation of achilles tendon  bilateral.  Ankle df less than 10 degrees with knee extended b/l.  Radiographic Exam (right foot 3 weightbearing views obtained on 03/11/2024 were reviewed):  Normal osseous mineralization.  Previous fusion of the first metatarsal-medial cuneiform joint with 2 screws in place.  Also a small screw in the head of the second metatarsal.  There is an enlarged first intermetatarsal angle.  There is a navicular-medial cuneiform fault noted on the lateral view.  Assessment/Plan of Care: 1. Plantar fasciitis of right foot   2. Neuritis   3. Posterior tibial tendinitis, right     Meds ordered this encounter  Medications   methylPREDNISolone  (MEDROL  DOSEPAK) 4 MG TBPK tablet    Sig: Take as directed    Dispense:  21 tablet    Refill:  0   AMB REFERRAL TO PHYSICAL THERAPY  -Reviewed etiology of plantar fasciitis with patient.  Discussed treatment options with patient today, including cortisone injection, NSAID course of treatment, stretching exercises, physical therapy, use of night splint, rest, icing the heel, arch supports/orthotics, and supportive shoe gear.    Informed patient that we would not be administering a cortisone injection today because she has no true trigger point and is fairly asymptomatic on exam.  Once again confirmed that she does not have any type of allergic reaction to a Medrol  Dosepak, and this was prescribed for her to take orally if and when the symptoms return.  Added physical therapy for plantar fasciitis and posterior tibial tendinitis of the right extremity to her current PT order and this was placed today.  Will have them also evaluate for possible radiculopathy since her symptoms were present and flared up only when sitting for prolonged periods of time while driving in a car.  A felt pad was placed on the undersurface of her current orthotic to see if she needs some additional support/midfoot cushioning for her current symptoms.  If this provides more discomfort then  relief, she was instructed to remove it.  Return in about 6 weeks (around 04/28/2024) for with Dr. Gershon for recheck after PT.   Awanda CHARM Imperial, DPM, FACFAS [facility]     [address] [place], [place] [address]                Office [phone]  Fax [phone]

## 2024-03-18 ENCOUNTER — Ambulatory Visit: Payer: Self-pay | Admitting: Adult Health

## 2024-03-20 ENCOUNTER — Encounter: Payer: Self-pay | Admitting: Adult Health

## 2024-03-20 ENCOUNTER — Ambulatory Visit: Admitting: Adult Health

## 2024-03-20 VITALS — BP 153/87 | HR 75 | Ht 63.0 in | Wt 201.0 lb

## 2024-03-20 DIAGNOSIS — F17210 Nicotine dependence, cigarettes, uncomplicated: Secondary | ICD-10-CM

## 2024-03-20 DIAGNOSIS — J4520 Mild intermittent asthma, uncomplicated: Secondary | ICD-10-CM

## 2024-03-20 DIAGNOSIS — Z6835 Body mass index (BMI) 35.0-35.9, adult: Secondary | ICD-10-CM | POA: Diagnosis not present

## 2024-03-20 DIAGNOSIS — G4733 Obstructive sleep apnea (adult) (pediatric): Secondary | ICD-10-CM

## 2024-03-20 DIAGNOSIS — R0609 Other forms of dyspnea: Secondary | ICD-10-CM

## 2024-03-20 NOTE — Assessment & Plan Note (Signed)
 Workup shows normal PFTs.  High-resolution CT chest negative for interstitial lung disease. Continue to treat underlying asthma.  Restart treatment for sleep apnea.  Plan  Patient Instructions  Begin CPAP At bedtime, wear all night long for at least 6hr or more.  Try Dreamwear nasal mask  Use Saline nasal gel At bedtime    Continue on Singulair 10mg  At bedtime .  Zyrtec 10mg  At bedtime  .As needed  drainage/allergies  Albuterol  inhaler As needed    Work on healthy weight loss  Do not drive if sleepy  Use caution with sedating medications   Follow up in 4 months and As needed

## 2024-03-20 NOTE — Assessment & Plan Note (Signed)
Encouraged on healthy weight loss 

## 2024-03-20 NOTE — Assessment & Plan Note (Signed)
 Repeat sleep study shows moderate obstructive sleep apnea.  Patient has underlying hypertension and pulmonary hypertension.  We discussed treatment options including CPAP therapy, weight loss and inspire device.  Patient wants to hold off on inspire at this time.  Wants to retry CPAP with a different type of mask.  Says Marie Farmer cannot use a fullface mask.  Will try DreamWear nasal mask.  If unable to tolerate would consider moving forward with the inspire evaluation  Plan  Patient Instructions  Begin CPAP At bedtime, wear all night long for at least 6hr or more.  Try Dreamwear nasal mask  Use Saline nasal gel At bedtime    Continue on Singulair 10mg  At bedtime .  Zyrtec 10mg  At bedtime  .As needed  drainage/allergies  Albuterol  inhaler As needed    Work on healthy weight loss  Do not drive if sleepy  Use caution with sedating medications   Follow up in 4 months and As needed

## 2024-03-20 NOTE — Patient Instructions (Addendum)
 Begin CPAP At bedtime, wear all night long for at least 6hr or more.  Try Dreamwear nasal mask  Use Saline nasal gel At bedtime    Continue on Singulair 10mg  At bedtime .  Zyrtec 10mg  At bedtime  .As needed  drainage/allergies  Albuterol  inhaler As needed    Work on healthy weight loss  Do not drive if sleepy  Use caution with sedating medications   Follow up in 4 months and As needed

## 2024-03-20 NOTE — Assessment & Plan Note (Signed)
 Mild intermittent asthma.  Continue with trigger prevention.  Previous PFTs are normal.  Plan  Patient Instructions  Begin CPAP At bedtime, wear all night long for at least 6hr or more.  Try Dreamwear nasal mask  Use Saline nasal gel At bedtime    Continue on Singulair 10mg  At bedtime .  Zyrtec 10mg  At bedtime  .As needed  drainage/allergies  Albuterol  inhaler As needed    Work on healthy weight loss  Do not drive if sleepy  Use caution with sedating medications   Follow up in 4 months and As needed

## 2024-03-20 NOTE — Progress Notes (Signed)
 @Patient  ID: Marie Farmer, female    DOB: [birth year], 68 y.o.   MRN: [id number]  Chief Complaint  Patient presents with   Follow-up    Referring provider: Rosamond Leta NOVAK, MD  HPI: 68 year old female minimum smoking history seen for consult 09/28/2023 for daytime sleepiness, dyspnea and pulmonary hypertension Medical history significant for rheumatoid arthritis, chronic pain (neck and back)  TEST/EVENTS :  In-lab sleep study March 05, 2024  pulmonary function testing that was completed on November 22, 2023 that showed normal lung function with no airflow obstruction or restriction and normal diffusing capacity.   2D echo showed EF at 58%, grade 1 diastolic dysfunction, normal right ventricular size, mild MR, mild to moderate TR and moderate pulmonary hypertension with RVSP at 41 mmHg.   03/20/2024 Follow up : OSA and Pulmonary hypertension Patient presents for 40-month follow-up.  Patient was seen last visit for an evaluation of shortness of breath, pulmonary hypertension and sleep apnea.  Patient was referred by cardiology.  2D echo showed preserved EF, grade 1 diastolic dysfunction, normal right ventricular size, mild to moderate TR, moderate pulmonary hypertension.  Previous PFTs were normal.  She does have a history of rheumatoid arthritis.  She was set up for a High-resolution CT chest done on March 15, 2024 was negative for interstitial lung disease.  Showed a few scattered micronodules.  Incidental findings multinodular thyroid  gland adrenal adenoma. She had recent thyroid  Ultrasound. Overall says breathing is doing about the same. No cough or wheezing. Gets winded with prolonged walking. No increased albuterol  use.  She was set up for a split-night sleep study that was done on March 05, 2024 that showed moderate sleep apnea with AHI at 17.6/hour, SpO2 low at 81%.  Titration portion showed optimal control at CPAP 14 cm H2O with a medium fullface mask.(0.8/hr) We discussed her sleep study  results in detail.  Patient has been diagnosed with sleep apnea past and previously wore CPAP but had difficulty tolerating.   We discussed INSPIRE device. She wants to try CPAP again. Can not use full face mask.     Allergies  Allergen Reactions   Ace Inhibitors Shortness Of Breath   Beta Adrenergic Blockers Shortness Of Breath   Adhesive [Tape]     Peels skin   Cymbalta [Duloxetine Hcl]     Gums red, mouth felt hout   Methylprednisolone      Redness all over   Prednisone Other (See Comments)    Redness all over   Azithromycin Rash   Latex Rash    Sensitive skin, causes redness    Nabumetone Rash and Other (See Comments)    lips swollen      Immunization History  Administered Date(s) Administered   Fluad Trivalent(High Dose 65+) 07/20/2023   Influenza Inj Mdck Quad With Preservative 05/31/2020   Influenza-Unspecified 10/23/2016   PFIZER Comirnaty(Gray Top)Covid-19 Tri-Sucrose Vaccine 10/01/2020   PFIZER(Purple Top)SARS-COV-2 Vaccination 01/05/2020, 01/26/2020   Tdap 01/13/2013    Past Medical History:  Diagnosis Date   Anxiety    Asthma    DDD (degenerative disc disease)    chronic back pain   Fibromyalgia    Foot pain    GERD (gastroesophageal reflux disease)    Hypertension    IBS (irritable bowel syndrome)    Mood disorder (HCC)    PONV (postoperative nausea and vomiting)    Sleep apnea    Venous insufficiency    Wears glasses     Tobacco History: Social History  Tobacco Use  Smoking Status Former   Current packs/day: 0.00   Types: Cigarettes   Quit date: 12/11/1983   Years since quitting: 40.3  Smokeless Tobacco Never   Counseling given: Not Answered   Outpatient Medications Prior to Visit  Medication Sig Dispense Refill   acetaminophen  (TYLENOL ) 500 MG tablet Take 1,000 mg by mouth every 6 (six) hours as needed for moderate pain.     albuterol  (VENTOLIN  HFA) 108 (90 Base) MCG/ACT inhaler Inhale 2 puffs into the lungs every 6 (six) hours as  needed for wheezing or shortness of breath.     Biotin 1000 MCG CHEW Chew 1 tablet by mouth daily.     ergocalciferol  (VITAMIN D2) 1.25 MG (50000 UT) capsule Take 1 capsule (50,000 Units total) by mouth once a week. 12 capsule 4   Flaxseed, Linseed, (FLAXSEED OIL) 1000 MG CAPS Take 1 capsule by mouth daily.     FLUoxetine (PROZAC) 20 MG capsule Take 20 mg by mouth daily.     fluticasone  (FLONASE ) 50 MCG/ACT nasal spray place 2 sprays into both nostrils daily. 1 g 3   gabapentin  (NEURONTIN ) 600 MG tablet Take 1 tablet (600 mg total) by mouth 3 (three) times daily. 360 tablet 2   levothyroxine  (SYNTHROID ) 75 MCG tablet Take 1 tablet (75 mcg total) by mouth every morning on an empty stomach. 90 tablet 5   Magnesium 400 MG CAPS Take 2 tablets by mouth daily.     methylPREDNISolone  (MEDROL  DOSEPAK) 4 MG TBPK tablet Take as directed 21 tablet 0   montelukast (SINGULAIR) 10 MG tablet Take 10 mg by mouth daily.     Omega 3 1000 MG CAPS Take 1 capsule by mouth daily.     omeprazole  (PRILOSEC) 20 MG capsule Take 1 capsule (20 mg total) by mouth 2 (two) times daily. (Patient taking differently: Take 20 mg by mouth daily.) 180 capsule 4   sulfaSALAzine  (AZULFIDINE ) 500 MG tablet Take 3 tablets (1,500 mg total) by mouth 2 (two) times daily. 540 tablet 4   tiZANidine  (ZANAFLEX ) 4 MG capsule Take 4 mg by mouth 3 (three) times daily.     valACYclovir (VALTREX) 500 MG tablet Take 500 mg by mouth daily as needed.     valsartan -hydrochlorothiazide  (DIOVAN -HCT) 160-25 MG tablet Take 1 tablet by mouth daily. 90 tablet 4   Vitamin D , Ergocalciferol , (DRISDOL ) 1.25 MG (50000 UNIT) CAPS capsule Take 1 capsule (50,000 Units total) by mouth once a week. 12 capsule 0   No facility-administered medications prior to visit.     Review of Systems:   Constitutional:   No  weight loss, night sweats,  Fevers, chills, +fatigue, or  lassitude.  HEENT:   No headaches,  Difficulty swallowing,  Tooth/dental problems, or  Sore  throat,                No sneezing, itching, ear ache, +nasal congestion, post nasal drip,   CV:  No chest pain,  Orthopnea, PND, swelling in lower extremities, anasarca, dizziness, palpitations, syncope.   GI  No heartburn, indigestion, abdominal pain, nausea, vomiting, diarrhea, change in bowel habits, loss of appetite, bloody stools.   Resp:   No chest wall deformity  Skin: no rash or lesions.  GU: no dysuria, change in color of urine, no urgency or frequency.  No flank pain, no hematuria   MS:  No joint pain or swelling.  No decreased range of motion.  No back pain.    Physical Exam  BP ROLLEN)  153/87   Pulse 75   Ht 5' 3 (1.6 m)   Wt 201 lb (91.2 kg)   LMP 05/12/2007   SpO2 96% Comment: RA  BMI 35.61 kg/m   GEN: A/Ox3; pleasant , NAD, well nourished    HEENT:  [place]/AT,  EACs-clear, TMs-wnl, NOSE-clear, THROAT-clear, no lesions, no postnasal drip or exudate noted.   NECK:  Supple w/ fair ROM; no JVD; normal carotid impulses w/o bruits; no thyromegaly or nodules palpated; no lymphadenopathy.    RESP  Clear  P & A; w/o, wheezes/ rales/ or rhonchi. no accessory muscle use, no dullness to percussion  CARD:  RRR, no m/r/g, no peripheral edema, pulses intact, no cyanosis or clubbing.  GI:   Soft & nt; nml bowel sounds; no organomegaly or masses detected.   Musco: Warm bil, no deformities or joint swelling noted.   Neuro: alert, no focal deficits noted.    Skin: Warm, no lesions or rashes    Lab Results:  CBC   BMET   Imaging: CT Chest High Resolution Result Date: 03/18/2024 CLINICAL DATA:  Increased lung markings, history of rheumatoid arthritis EXAM: CT CHEST WITHOUT CONTRAST TECHNIQUE: Multidetector CT imaging of the chest was performed following the standard protocol without intravenous contrast. High resolution imaging of the lungs, as well as inspiratory and expiratory imaging, was performed. RADIATION DOSE REDUCTION: This exam was performed according to the  departmental dose-optimization program which includes automated exposure control, adjustment of the mA and/or kV according to patient size and/or use of iterative reconstruction technique. COMPARISON:  Chest radiograph November 26, 2023 FINDINGS: Cardiovascular: The heart size is normal.  No pericardial effusion. Thyroid : Right thyroid  lobe calcified nodule with a rim calcification measuring 2.6 cm. Additional smaller noncalcified nodules in the isthmic region. Mediastinum/Nodes/trachea: A few subcentimeter mediastinal lymph nodes not enlarged by CT size criteria. Lungs/Pleura: Few scattered pulmonary micro nodules for example in right lower lobe 5/77, left perifissural 5/117. Scattered calcified pulmonary micro nodules suggestive of prior granulomatous disease for example right lower lobe (5/95). No fibrotic changes, air trapping, emphysematous changes, architectural distortion or honeycombing. No consolidation or pleural effusion. Trachea and major airways are patent. No endobronchial lesion. Upper Abdomen: Cholecystectomy. Left adrenal lipid rich adenoma measuring 1.4 cm, stable. Left kidney cortical cysts which does not require imaging follow-up. Musculoskeletal: Multilevel degenerative changes of the spine. IMPRESSION: A few scattered solid noncalcified micro nodules. No follow-up needed if patient is low-risk (and has no known or suspected primary neoplasm). Non-contrast chest CT can be considered in 12 months if patient is high-risk. This recommendation follows the consensus statement: Guidelines for Management of Incidental Pulmonary Nodules Detected on CT Images: From the Fleischner Society 2017; Radiology 2017; 284:228-243. No suspicious findings to suggest collagen vascular related interstitial lung disease or fibrotic changes. Multinodular thyroid  gland. Left adrenal lipid rich adenoma which does not require imaging follow-up. Electronically Signed   By: Megan  Zare M.D.   On: 03/18/2024 19:39   US   THYROID  Result Date: 03/12/2024 CLINICAL DATA:  Goiter. Prior biopsy of right superior thyroid  nodule in April of 2023. TI-RADS category 4 nodule in the right upper gland currently under imaging surveillance since April of 2023. EXAM: THYROID  ULTRASOUND TECHNIQUE: Ultrasound examination of the thyroid  gland and adjacent soft tissues was performed. COMPARISON:  Prior thyroid  ultrasound 02/21/2023; 12/16/2021 FINDINGS: Parenchymal Echotexture: Mildly heterogenous Isthmus: 0.6 cm Right lobe: 5.0 x 2.5 x 2.5 cm Left lobe: 3.7 x 1.3 x 1.0 cm _________________________________________________________ Estimated total number of nodules >/=  1 cm: 3 Number of spongiform nodules >/=  2 cm not described below (TR1): 0 Number of mixed cystic and solid nodules >/= 1.5 cm not described below (TR2): 0 _________________________________________________________ Nodule # 1: Stable mixed cystic and solid nodule exophytic from the thyroid  isthmus at 1.7 x 1.4 x 1.4 cm. This nodule has significantly decreased in size compared to prior imaging from 2023. TI-RADS category 2. This nodule does NOT meet TI-RADS criteria for biopsy or dedicated follow-up. Nodule # 2: The previously biopsied nodule in the right upper gland remains grossly unchanged at 1.5 x 1.4 x 1.2 cm. Previously identified hypoechoic solid nodule also in the right upper gland is no longer visible. Nodule # 4: Mixed cystic and solid nodule in the right lower gland is grossly unchanged to slightly larger at 3.7 x 3.7 x 2.3 cm. TI-RADS category 2. This nodule does NOT meet TI-RADS criteria for biopsy or dedicated follow-up. IMPRESSION: 1. The small TI-RADS category 4 nodule in the right upper gland under imaging surveillance is no longer visualized on today's study and appears to have involuted. 2. Previously biopsied nodule in the right upper gland is stable to slightly smaller in size. 3. Unchanged sonographically low risk mixed cystic and solid thyroid  nodules in the thyroid   isthmus and right lower gland. The above is in keeping with the ACR TI-RADS recommendations - J Am Coll Radiol 2017;14:587-595. Electronically Signed   By: Wilkie Lent M.D.   On: 03/12/2024 13:52   DG Foot Complete Right Result Date: 03/11/2024 Please see detailed radiograph report in office note.  Split night study Result Date: 03/05/2024 Jude Harden GAILS, MD     03/12/2024 10:09 PM Darryle Law [facility] [address] [place], [place] [address] Tel: [phone]   Fax: [phone] Split Night Interpretation Patient Name:  BRAYDEN, BETTERS Date:  03/05/2024 Referring Physician:  Madelin RAMAN. Bodey Frizell, Np Indications for Polysomnography The patient is a 68 year old Female who is 5' 3 and weighs 200.0 lbs.  Her BMI equals 35.4.  A diagnostic polysomnogram was performed to evaluate for reassessment of OSA.  After 126.0 minutes of sleep time the patient exhibited sufficient respiratory events qualifying her for a CPAP trial which was then initiated.  Medications taken at 2125. TIZANIDINE  SULFASALAZINE  GABAPENTIN  MONTELUKAST Polysomnogram Data A full night polysomnogram was performed recording the standard physiologic parameters including EEG, EOG, EMG, EKG, nasal and oral airflow.  Respiratory parameters of chest and abdominal movements are recorded with [place] motion transducers.  Oxygen saturation was recorded by pulse oximetry.  Sleep Architecture The total recording time of the diagnostic portion of the study was 154.7 minutes.  The total sleep time was 126.0 minutes.  During the diagnostic portion of the study, the patient spent 10.7% of total sleep time in Stage N1, 52.4% in Stage N2, 36.9% in Stages N3, and 0.0% in REM.   Sleep latency was 17.1 minutes.  REM latency was - minutes.  Sleep Efficiency was 81.5%.  Wake after Sleep Onset time was 11.5 minutes. At 12:41:47 AM the patient was placed on PAP treatment and was titrated at pressures ranging from 5* cm/H20 with  supplemental oxygen at - up to 14* cm/H20 with supplemental oxygen at -.  The total recording time of the treatment portion of the study was 285.3 minutes.  The total sleep time was 237.5 minutes.  During the treatment portion of the study, the patient spent 13.7% of total sleep time in Stage N1, 48.4% in Stage N2,  15.4% in Stages N3, and 22.5% in REM.   Sleep latency was 1.0 minutes.  REM latency was 7.5 minutes.  Sleep Efficiency was 83.3%.  Wake after Sleep Onset time was 46.5 minutes. Respiratory Events During the diagnostic portion of the study, the polysomnogram revealed a presence of - obstructive, - central, and - mixed apneas resulting in an Apnea index of - events per hour.  There were 53 hypopneas (>=3% desaturation and/or arousal) resulting in an Apnea\Hypopnea Index (AHI >=3% desaturation and/or arousal) of 25.2 events per hour.  There were 37 hypopneas (>=4% desaturation) resulting in an Apnea\Hypopnea Index (AHI >=4% desaturation) of 17.6 events per hour.  There were 18 Respiratory Effort Related Arousals resulting in a RERA index of 8.6 events per hour. The Respiratory Disturbance Index is 33.8 events per hour.  The snore index was - events per hour.  Mean oxygen saturation was 92.6%.  The lowest oxygen saturation during sleep was 81.0%.  Time spent <=88% oxygen saturation was 2.5 minutes (1.6%). During the treatment portion of the study, the polysomnogram revealed a presence of 1 obstructive, 2 centrals, and - mixed apneas resulting in an Apnea index of 0.8 events per hour.  There were 27 hypopneas (>=3% desaturation and/or arousal) resulting in an Apnea\Hypopnea Index (AHI >=3% desaturation and/or arousal) of 7.6 events per hour.  There were 15 hypopneas (>=4% desaturation) resulting in an Apnea\Hypopnea Index (AHI >=4% desaturation) of 4.5 events per hour.  There were 12 Respiratory Effort Related Arousals resulting in a RERA index of 3.0 events per hour. The Respiratory Disturbance Index is 10.6  events per hour.  The snore index was - events per hour.  Mean oxygen saturation was 94.6%.  The lowest oxygen saturation during sleep was 85.0%.  Time spent <=88% oxygen saturation was 0.5 minutes (0.2%). Limb Activity During the diagnostic portion of the study, there were 12 limb movements recorded.  Of this total, 12 were classified as PLMs.  Of the PLMs, - were associated with arousals.  The Limb Movement index was 5.7 per hour while the PLM index was 5.7 per hour. During the treatment portion of the study, there were 4 limb movements recorded.  Of this total, 4 were classified as PLMs.  Of the PLMs, - were associated with arousals.  The Limb Movement index was 1.0 per hour while the PLM index was 1.0 per hour. Cardiac Summary During the diagnostic portion of the study, the average pulse rate was 63.8 bpm.  The minimum pulse rate was 55.0 bpm while the maximum pulse rate was 81.0 bpm. During the treatment portion of the study, the average pulse rate was 60.3 bpm.  The minimum pulse rate was 51.0 bpm while the maximum pulse rate was 85.0 bpm. Diagnosis: Moderate -severe OSA, predominant hypopneas & RERAs This was corrected by CPAP 14 cm Titration was optimal with REM sleep noted at final level, but no supine sleep Recommendations: CPAP 14 cm with medium fullface mask can be initiated Weight loss encouraged or maintain ideal body weight. Patient should be cautioned against driving when sleepy and should avoid medication with sedative side effects. This study was personally reviewed and electronically signed by: Dr. Harden Staff Accredited Board Certified in Sleep Medicine   Administration History     None          Latest Ref Rng & Units 11/22/2023   12:29 PM  PFT Results  FVC-Pre L 2.95   FVC-Predicted Pre % 96   FVC-Post L 2.95  FVC-Predicted Post % 97   Pre FEV1/FVC % % 88   Post FEV1/FCV % % 88   FEV1-Pre L 2.58   FEV1-Predicted Pre % 111   FEV1-Post L 2.61   DLCO uncorrected ml/min/mmHg  17.38   DLCO UNC% % 90   DLVA Predicted % 93     No results found for: NITRICOXIDE      Assessment & Plan:   OSA on CPAP Repeat sleep study shows moderate obstructive sleep apnea.  Patient has underlying hypertension and pulmonary hypertension.  We discussed treatment options including CPAP therapy, weight loss and inspire device.  Patient wants to hold off on inspire at this time.  Wants to retry CPAP with a different type of mask.  Says she cannot use a fullface mask.  Will try DreamWear nasal mask.  If unable to tolerate would consider moving forward with the inspire evaluation  Plan  Patient Instructions  Begin CPAP At bedtime, wear all night long for at least 6hr or more.  Try Dreamwear nasal mask  Use Saline nasal gel At bedtime    Continue on Singulair 10mg  At bedtime .  Zyrtec 10mg  At bedtime  .As needed  drainage/allergies  Albuterol  inhaler As needed    Work on healthy weight loss  Do not drive if sleepy  Use caution with sedating medications   Follow up in 4 months and As needed            Asthma Mild intermittent asthma.  Continue with trigger prevention.  Previous PFTs are normal.  Plan  Patient Instructions  Begin CPAP At bedtime, wear all night long for at least 6hr or more.  Try Dreamwear nasal mask  Use Saline nasal gel At bedtime    Continue on Singulair 10mg  At bedtime .  Zyrtec 10mg  At bedtime  .As needed  drainage/allergies  Albuterol  inhaler As needed    Work on healthy weight loss  Do not drive if sleepy  Use caution with sedating medications   Follow up in 4 months and As needed            Morbid obesity due to excess calories (HCC) Encouraged on healthy weight loss.   Dyspnea on exertion Workup shows normal PFTs.  High-resolution CT chest negative for interstitial lung disease. Continue to treat underlying asthma.  Restart treatment for sleep apnea.  Plan  Patient Instructions  Begin CPAP At bedtime, wear all night  long for at least 6hr or more.  Try Dreamwear nasal mask  Use Saline nasal gel At bedtime    Continue on Singulair 10mg  At bedtime .  Zyrtec 10mg  At bedtime  .As needed  drainage/allergies  Albuterol  inhaler As needed    Work on healthy weight loss  Do not drive if sleepy  Use caution with sedating medications   Follow up in 4 months and As needed               Madelin Stank, NP 03/20/2024

## 2024-03-21 ENCOUNTER — Ambulatory Visit (INDEPENDENT_AMBULATORY_CARE_PROVIDER_SITE_OTHER): Admitting: Physician Assistant

## 2024-03-23 NOTE — Therapy (Signed)
 OUTPATIENT PHYSICAL THERAPY EVALUATION   Patient Name: Marie Farmer MRN: [id number] DOB:[birth year], 68 y.o., female Today's Date: 03/24/2024  END OF SESSION:  PT End of Session - 03/24/24 0908     Visit Number 1    Number of Visits 12    Date for PT Re-Evaluation 05/05/24    Progress Note Due on Visit 10    PT Start Time 0800    PT Stop Time 0850    PT Time Calculation (min) 50 min    Activity Tolerance Patient tolerated treatment well    Behavior During Therapy WFL for tasks assessed/performed          Past Medical History:  Diagnosis Date   Anxiety    Asthma    DDD (degenerative disc disease)    chronic back pain   Fibromyalgia    Foot pain    GERD (gastroesophageal reflux disease)    Hypertension    IBS (irritable bowel syndrome)    Mood disorder (HCC)    PONV (postoperative nausea and vomiting)    Sleep apnea    Venous insufficiency    Wears glasses    Past Surgical History:  Procedure Laterality Date   CARPAL TUNNEL RELEASE Right 06/10/2013   Procedure: CARPAL TUNNEL RELEASE;  Surgeon: Arley JONELLE Curia, MD;  Location: [facility];  Service: Orthopedics;  Laterality: Right;   CHOLECYSTECTOMY N/A 08/19/2021   Procedure: LAPAROSCOPIC CHOLECYSTECTOMY;  Surgeon: Kallie Manuelita BROCKS, MD;  Location: [facility];  Service: General;  Laterality: N/A;   COLONOSCOPY  12/20/2011   [name]-single diminutive sessile polyp in the sigmoid colon/small interenal hemorrhoids   COLONOSCOPY N/A 11/28/2017   Procedure: COLONOSCOPY;  Surgeon: Shaaron Lamar HERO, MD;  Location: [facility];  Service: Endoscopy;  Laterality: N/A;  10:30   COLONOSCOPY WITH PROPOFOL  N/A 07/07/2021   one 9 mm polyp in rectum s/p removal (serrated adenoma). surveillance in 5 years   ESOPHAGOGASTRODUODENOSCOPY  04/10/2003   Normal esophagus/Antral erosions, as described above.  The remainder of the stomach appeared normal.  Normal first and second parts of the duodenum.   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   Other specified nontoxic goiter 12/12/2021   Anemia of chronic disease 08/02/2021   Ankle impingement syndrome, left 03/25/2020   Osteochondritis dissecans of talus, left 03/25/2020   Instability of left ankle joint 02/03/2020   Plantar fasciitis 04/18/2018   Aphthous ulcer of mouth 04/02/2018   Difficulty sleeping 04/02/2018   Under emotional stress 04/02/2018   OSA on CPAP 02/01/2018   Dyspnea on exertion 10/16/2016   Essential hypertension 10/16/2016   Morbid obesity due to excess calories (HCC) 10/16/2016   IBS (irritable bowel syndrome) 02/29/2012   Tibial plateau fracture 05/10/2011   Helicobacter pylori infection 08/25/2009   DEPRESSION 08/25/2009   Asthma 08/25/2009   GASTROESOPHAGEAL REFLUX DISEASE, CHRONIC 08/25/2009   Allergy  08/25/2009   Cholelithiasis 08/25/2009    PCP: Rosamond Leta NOVAK, MD   REFERRING PROVIDER:  McCaughan, Dia D, DPM   REFERRING DIAG:  M79.2 (ICD-10-CM) - Neuritis  M72.2 (ICD-10-CM) - Plantar fasciitis of right foot  M76.821 (ICD-10-CM) - Posterior tibial tendinitis, right    Rationale for Evaluation and Treatment:  Rehabiliation  THERAPY DIAG:  Pain in right ankle and joints of right foot  Muscle weakness (generalized)  Difficulty in walking, not elsewhere classified  Other low back pain  ONSET DATE: 1.5 year onset of pain but worse since may 2025   SUBJECTIVE:                                                                                                                                                                                            SUBJECTIVE STATEMENT: Chief complaint of Right foot pain for year and a half but became aggravated more 01/29/2024 when she was driving to [place]  and she was having pain was out of state received injection which gave her relief but states foot is hurting on the outside of foot previous surgery on right foot.  She also relays she has been told that her left ankle/foot problem is coming from her back and she does endorse some back pain.   PERTINENT HISTORY:  See above PMH  PAIN:  NPRS scale: 4/10 upon arrival Pain location:Rt foot at metatarsal head and heel.  Pain description: constant, achy, sharp Aggravating factors: driving, standing, walking Relieving factors: rest, meds   PRECAUTIONS: ,  None  RED FLAGS: None   WEIGHT BEARING RESTRICTIONS:  No  FALLS:  Has patient fallen in last 6 months? Yes. Number of falls 1, does feel she is losing some balance.    PLOF:  Independent with basic ADLs, does have to get help with lifting water , can't climb ladder.   PATIENT GOALS:  Reduce pain.   OBJECTIVE:  Note: Objective measures were completed at Evaluation unless otherwise noted.  DIAGNOSTIC FINDINGS:  -X-rays obtained reviewed the right foot.  3 views were obtained.  Hardware intact from prior surgery.  Minimal calcaneal spurring is present posteriorly.  X-ray changes present of the third MTPJ as well as along the midfoot.  Radiographic Exam (right foot 3 weightbearing views obtained on 03/11/2024 were reviewed):  Normal osseous mineralization.  Previous fusion of the first metatarsal-medial cuneiform joint with 2 screws in place.  Also a small screw in the head of the second metatarsal.  There is an enlarged first intermetatarsal angle.  There is a navicular-medial cuneiform fault noted on the lateral  view.  PATIENT SURVEYS:  Patient-Specific Activity Scoring Scheme  0 represents "unable to perform." 10 represents "able to perform at prior level. 0 1  2 3 4 5 6 7 8 9 10  (Date and Score)   Activity Eval     1. walking  1    2. Driving for one hour  1    3.     4.    5.    Score 1/10    Total score = sum of the activity scores/number of activities Minimum detectable change (90%CI) for average score = 2 points Minimum detectable change (90%CI) for single activity score = 3 points     EDEMA:  Yes: moderate swelling in feet/ankles   GAIT: Distance walked: limited communitiy distances due to pain Assistive device utilized: None Level of assistance: Complete Independence Comments: increased foot ER due to tight heel cords  Ankle   LOWER EXTREMITY MMT    Strength MMT Right eval Left eval  Hip flexion    Hip extension    Hip abduction    Hip adduction    Hip internal rotation    Hip external rotation    Knee flexion 4+ 4+  Knee extension 4+ 4+  Ankle dorsiflexion 4+ 4+  Ankle plantarflexion 4 11 single leg calf raises 4 8 single leg calf raises  Ankle inversion 4+ 4+  Ankle eversion 4+ 4+   (Blank rows = not tested)   LOWER EXTREMITY ROM  AROM Right eval Left eval  Hip flexion    Hip extension    Hip abduction    Hip adduction    Hip internal rotation    Hip external rotation    Knee flexion    Knee extension    Ankle dorsiflexion 5 3  Ankle plantarflexion WNL WNL  Ankle inversion WNL WNL  Ankle eversion 15 15   (Blank rows = not tested)   FUNCTIONAL TESTS:  Eval: SLS 20 second avg, Tandem stance 30 second average, increased sway noted                                                                                                                              TREATMENT DATE:  Eval HEP creation and review with demonstration and trial set preformed, see below for details Manual therapy: IASTM and STM to bilateral ankle/foot focusing on  plantar fascia, achillis, gastroc-soleus bilat, added biofreeze into massage gel.     PATIENT EDUCATION: Education details: HEP, PT plan of care, selfcare Person educated: Patient Education method: Explanation, Demonstration, Verbal cues, and Handouts Education comprehension: verbalized understanding, further education recommended   HOME EXERCISE PROGRAM: Access Code: [email or web address] URL: [email or web address] Date: 03/24/2024 Prepared by: Redell Moose  Exercises - Gastroc Stretch on Wall  - 1 x daily - 3 x weekly - 1 sets - 3 reps - 30 hold - Soleus Stretch on Wall  - 2 x daily - 6 x weekly - 3 sets - 30 hold - Standing Eccentric Heel Raise  - 2 x daily - 6 x weekly - 3 sets - 10 reps - Sit to Stand  Without Arm Support  - 2 x daily - 6 x weekly - 2 sets - 5 reps - Tandem Stance  - 2 x daily - 6 x weekly - 1 sets - 2-3 reps - 30 sec hold - Standing Single Leg Stance with Counter Support  - 2 x daily - 6 x weekly - 1 sets - 3 reps - 20 sec hold  ASSESSMENT:  CLINICAL IMPRESSION: Patient referred to PT for Rt ankle/foot pain thought to be achillies tendonitis, posterior tibialis tendonitis, plantar fascitis and neurits. She also does have history of Previous fusion of the first metatarsal-medial cuneiform joint with 2 screws in place. Also presents with possible lumbar radicular symptoms as well.  Patient will benefit from skilled PT to improve overall function and to address impairments and limitations listed below.  OBJECTIVE IMPAIRMENTS: decreased activity tolerance for ADL's, difficulty walking, decreased balance, decreased endurance, decreased mobility, decreased ROM, decreased strength, impaired flexibility, impaired ankle use, and pain.  ACTIVITY LIMITATIONS: bending, lifting, carry, locomotion, cleaning, community activity, driving.  PERSONAL FACTORS: see above PMH  also affecting patient's functional outcome.  REHAB POTENTIAL: Good  CLINICAL DECISION MAKING:  Evolving/moderate complexity  EVALUATION COMPLEXITY: Moderate    GOALS: Short term PT Goals Target date: 04/21/2024   Pt will be I and compliant with HEP. Baseline:  Goal status: New Pt will decrease pain by 25% overall with walking 10 minutes Baseline: Goal status: New  Long term PT goals Target date:05/05/2024   Pt will improve ankle DF ROM >7 deg to improve functional mobility Baseline: Goal status: New Pt will improve  ankle PF strength to at least 15 single leg calf raises improve functional strength for walking endurance Baseline: Goal status: New Pt will improve Patient specific functional scale (PSFS) to at least 7/10 to show improved function level Baseline: Goal status: New Pt will be able to ambulate community distances at least 500 ft WNL gait pattern without complaints Baseline: Goal status: New  PLAN: PT FREQUENCY: 1-3 times per week   PT DURATION: 6 weeks  PLANNED INTERVENTIONS (unless contraindicated): aquatic PT, Canalith repositioning, cryotherapy, Electrical stimulation, Iontophoresis with 4 mg/ml dexamethasome, Moist heat, traction, Ultrasound, gait training, Therapeutic exercise, balance training, neuromuscular re-education, patient/family education, manual techniques, passive ROM, dry needling, taping, vasopnuematic device, spinal manipulations, joint manipulations 97110-Therapeutic exercises, 97530- Therapeutic activity, [id number]- Neuromuscular re-education, 97535- Self Care, and [id number]- Manual therapy  PLAN FOR NEXT SESSION: examine low back more, review HEP  NEXT MD VISIT: not scheduled at eval  Redell JONELLE Moose, PT,DPT 03/24/2024, 9:19 AM

## 2024-03-24 ENCOUNTER — Ambulatory Visit: Attending: Podiatry | Admitting: Physical Therapy

## 2024-03-24 ENCOUNTER — Encounter: Payer: Self-pay | Admitting: Physical Therapy

## 2024-03-24 DIAGNOSIS — M5459 Other low back pain: Secondary | ICD-10-CM | POA: Diagnosis not present

## 2024-03-24 DIAGNOSIS — M76821 Posterior tibial tendinitis, right leg: Secondary | ICD-10-CM | POA: Diagnosis not present

## 2024-03-24 DIAGNOSIS — M722 Plantar fascial fibromatosis: Secondary | ICD-10-CM | POA: Diagnosis not present

## 2024-03-24 DIAGNOSIS — M6281 Muscle weakness (generalized): Secondary | ICD-10-CM | POA: Diagnosis not present

## 2024-03-24 DIAGNOSIS — M792 Neuralgia and neuritis, unspecified: Secondary | ICD-10-CM | POA: Diagnosis not present

## 2024-03-24 DIAGNOSIS — R262 Difficulty in walking, not elsewhere classified: Secondary | ICD-10-CM | POA: Diagnosis not present

## 2024-03-24 DIAGNOSIS — M25571 Pain in right ankle and joints of right foot: Secondary | ICD-10-CM | POA: Diagnosis not present

## 2024-03-24 DIAGNOSIS — M7731 Calcaneal spur, right foot: Secondary | ICD-10-CM | POA: Insufficient documentation

## 2024-03-26 ENCOUNTER — Encounter: Payer: Self-pay | Admitting: Physical Therapy

## 2024-03-26 ENCOUNTER — Ambulatory Visit: Admitting: Physical Therapy

## 2024-03-26 DIAGNOSIS — M5459 Other low back pain: Secondary | ICD-10-CM

## 2024-03-26 DIAGNOSIS — R262 Difficulty in walking, not elsewhere classified: Secondary | ICD-10-CM

## 2024-03-26 DIAGNOSIS — M25571 Pain in right ankle and joints of right foot: Secondary | ICD-10-CM | POA: Diagnosis not present

## 2024-03-26 DIAGNOSIS — M6281 Muscle weakness (generalized): Secondary | ICD-10-CM

## 2024-03-26 NOTE — Therapy (Signed)
 OUTPATIENT PHYSICAL THERAPY TREATMENT   Patient Name: Marie Farmer MRN: [id number] DOB:1955-10-17, 68 y.o., female Today's Date: 03/26/2024  END OF SESSION:  PT End of Session - 03/26/24 0839     Visit Number 2    Number of Visits 12    Date for PT Re-Evaluation 05/05/24    Progress Note Due on Visit 10    PT Start Time 0755    PT Stop Time 0850    PT Time Calculation (min) 55 min    Activity Tolerance Patient tolerated treatment well    Behavior During Therapy WFL for tasks assessed/performed           Past Medical History:  Diagnosis Date   Anxiety    Asthma    DDD (degenerative disc disease)    chronic back pain   Fibromyalgia    Foot pain    GERD (gastroesophageal reflux disease)    Hypertension    IBS (irritable bowel syndrome)    Mood disorder (HCC)    PONV (postoperative nausea and vomiting)    Sleep apnea    Venous insufficiency    Wears glasses    Past Surgical History:  Procedure Laterality Date   CARPAL TUNNEL RELEASE Right 06/10/2013   Procedure: CARPAL TUNNEL RELEASE;  Surgeon: Arley JONELLE Curia, MD;  [facility];  Service: Orthopedics;  Laterality: Right;   CHOLECYSTECTOMY N/A 08/19/2021   Procedure: LAPAROSCOPIC CHOLECYSTECTOMY;  Surgeon: Kallie Manuelita BROCKS, MD;  Location: [facility];  Service: General;  Laterality: N/A;   COLONOSCOPY  12/20/2011   [name]-single diminutive sessile polyp in the sigmoid colon/small interenal hemorrhoids   COLONOSCOPY N/A 11/28/2017   Procedure: COLONOSCOPY;  Surgeon: Shaaron Lamar HERO, MD;  [facility];  Service: Endoscopy;  Laterality: N/A;  10:30   COLONOSCOPY WITH PROPOFOL  N/A 07/07/2021   one 9 mm polyp in rectum s/p removal (serrated adenoma). surveillance in 5 years   ESOPHAGOGASTRODUODENOSCOPY  04/10/2003   Normal esophagus/Antral erosions, as described above.  The remainder of the stomach appeared normal.  Normal first and second parts of the duodenum.    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   Other specified nontoxic goiter 12/12/2021   Anemia of chronic disease 08/02/2021   Ankle impingement syndrome, left 03/25/2020   Osteochondritis dissecans of talus, left 03/25/2020   Instability of left ankle joint 02/03/2020   Plantar fasciitis 04/18/2018   Aphthous ulcer of mouth 04/02/2018   Difficulty sleeping 04/02/2018   Under emotional stress 04/02/2018   OSA on CPAP 02/01/2018   Dyspnea on exertion 10/16/2016   Essential hypertension 10/16/2016   Morbid obesity due to excess calories (HCC) 10/16/2016   IBS (irritable bowel syndrome) 02/29/2012   Tibial plateau fracture 05/10/2011   Helicobacter pylori infection 08/25/2009   DEPRESSION 08/25/2009   Asthma 08/25/2009   GASTROESOPHAGEAL REFLUX DISEASE, CHRONIC 08/25/2009   Allergy  08/25/2009   Cholelithiasis 08/25/2009    PCP: Rosamond Leta NOVAK, MD   REFERRING PROVIDER:  McCaughan, Dia D, DPM   REFERRING DIAG:  M79.2 (ICD-10-CM) - Neuritis  M72.2 (ICD-10-CM) - Plantar fasciitis of right foot  M76.821 (ICD-10-CM) - Posterior tibial tendinitis, right    Rationale for Evaluation and Treatment:  Rehabiliation  THERAPY DIAG:  Pain in right ankle and joints of right foot  Muscle weakness (generalized)  Difficulty in walking, not elsewhere classified  Other low back pain  ONSET DATE: 1.5 year onset of pain but worse since  may 2025   SUBJECTIVE:                                                                                                                                                                                           SUBJECTIVE STATEMENT: Relays compliance with HEP, did have some questions about them. She is interested in learning what she can do at [facility] as she has membership there  PERTINENT HISTORY:  See above PMH  PAIN:  NPRS scale: 3-4/10 upon arrival Pain location:Rt foot at metatarsal head and heel.  Pain description: constant, achy, sharp Aggravating factors: driving, standing, walking Relieving factors: rest, meds   PRECAUTIONS: ,  None  RED FLAGS: None   WEIGHT BEARING RESTRICTIONS:  No  FALLS:  Has patient fallen in last 6 months? Yes. Number of falls 1, does feel she is losing some balance.    PLOF:  Independent with basic ADLs, does have to get help with lifting water , can't climb ladder.   PATIENT GOALS:  Reduce pain.   OBJECTIVE:  Note: Objective measures were completed at Evaluation unless otherwise noted.  DIAGNOSTIC FINDINGS:  -X-rays obtained reviewed the right foot.  3 views were obtained.  Hardware intact from prior surgery.  Minimal calcaneal spurring is present posteriorly.  X-ray changes present of the third MTPJ as well as along the midfoot.  Radiographic Exam (right foot 3 weightbearing views obtained on 03/11/2024 were reviewed):  Normal osseous mineralization.  Previous fusion of the first metatarsal-medial cuneiform joint with 2 screws in place.  Also a small screw in the head of the second metatarsal.  There is an enlarged first intermetatarsal angle.  There is a navicular-medial cuneiform fault noted on the lateral view.  Lumbar MRI IMPRESSION: Small spondylitic disc herniations at L3-L4, L4-L5 and L5-S1 as described above.  PATIENT SURVEYS:  Patient-Specific Activity Scoring Scheme  0 represents "unable to perform." 10 represents "able to  perform at prior level. 0 1 2 3 4 5 6 7 8 9  10 (Date and Score)   Activity Eval     1. walking  1    2. Driving for one hour  1  3.     4.    5.    Score 1/10    Total score = sum of the activity scores/number of activities Minimum detectable change (90%CI) for average score = 2 points Minimum detectable change (90%CI) for single activity score = 3 points     EDEMA:  Yes: moderate swelling in feet/ankles   GAIT: Distance walked: limited communitiy distances due to pain Assistive device utilized: None Level of assistance: Complete Independence Comments: increased foot ER due to tight heel cords  Ankle   LOWER EXTREMITY MMT    Strength MMT Right eval Left eval  Hip flexion    Hip extension    Hip abduction    Hip adduction    Hip internal rotation    Hip external rotation    Knee flexion 4+ 4+  Knee extension 4+ 4+  Ankle dorsiflexion 4+ 4+  Ankle plantarflexion 4 11 single leg calf raises 4 8 single leg calf raises  Ankle inversion 4+ 4+  Ankle eversion 4+ 4+   (Blank rows = not tested)   LOWER EXTREMITY ROM  AROM Right eval Left eval  Hip flexion    Hip extension    Hip abduction    Hip adduction    Hip internal rotation    Hip external rotation    Knee flexion    Knee extension    Ankle dorsiflexion 5 3  Ankle plantarflexion WNL WNL  Ankle inversion WNL WNL  Ankle eversion 15 15   (Blank rows = not tested)   FUNCTIONAL TESTS:  Eval: SLS 20 second avg, Tandem stance 30 second average, increased sway noted                                                                                                                              TREATMENT DATE:  03/26/24 Self care HEP review and education, cuing provided, answered her questions about them Gym equipment review with sets/reps, training schedule, and modifications needed that should be safe for her to do at [facility] to supplement her progress with PT  Therex Standing gastroc stretch 30  sec X 3 bilat Standing soleus stretch 30 sec X 3 bilat Eccentric heel raises, up with 2, down with 1, X 10 reps bilat Tandem balance 30 sec X 3 bilat Single leg balance 20 sec X 2 bilat Sit to stands no UE support X 10 reps Nu step X 10 min L5 UE/LE  The[employer]ivity Leg press DL 59# 7K89 Calf raise machine 20# 2X10  Back extension machine 55# 2X10 Abdominal machine 25# 2X10    PATIENT EDUCATION: Education details: HEP, PT plan of care, selfcare Person educated: Patient Education method: Explanation, Demonstration, Verbal cues, and Handouts Education comprehension: verbalized understanding, further education recommended   HOME EXERCISE PROGRAM: Access Code: [email or web address] URL: [email or web address] Date: 03/24/2024 Prepared by: Redell Moose  Exercises - Gastroc Stretch on Wall  - 1 x daily - 3 x weekly - 1  sets - 3 reps - 30 hold - Soleus Stretch on Wall  - 2 x daily - 6 x weekly - 3 sets - 30 hold - Standing Eccentric Heel Raise  - 2 x daily - 6 x weekly - 3 sets - 10 reps - Sit to Stand Without Arm Support  - 2 x daily - 6 x weekly - 2 sets - 5 reps - Tandem Stance  - 2 x daily - 6 x weekly - 1 sets - 2-3 reps - 30 sec hold - Standing Single Leg Stance with Counter Support  - 2 x daily - 6 x weekly - 1 sets - 3 reps - 20 sec hold  ASSESSMENT:  CLINICAL IMPRESSION: Lumbar MRI reviewed and no radicular symptoms present today so focused on her ankle primarily but did show her some back exercises that would be good for her as well. Ankle stability already showing some improvements with her balance training today and she shows good early compliance with HEP. She has a better understanding of them after we reviewed them as well.   OBJECTIVE IMPAIRMENTS: decreased activity tolerance for ADL's, difficulty walking, decreased balance, decreased endurance, decreased mobility, decreased ROM, decreased strength, impaired flexibility, impaired ankle use, and pain.  ACTIVITY  LIMITATIONS: bending, lifting, carry, locomotion, cleaning, community activity, driving.  PERSONAL FACTORS: see above PMH  also affecting patient's functional outcome.  REHAB POTENTIAL: Good  CLINICAL DECISION MAKING: Evolving/moderate complexity  EVALUATION COMPLEXITY: Moderate    GOALS: Short term PT Goals Target date: 04/21/2024   Pt will be I and compliant with HEP. Baseline:  Goal status: New Pt will decrease pain by 25% overall with walking 10 minutes Baseline: Goal status: New  Long term PT goals Target date:05/05/2024   Pt will improve ankle DF ROM >7 deg to improve functional mobility Baseline: Goal status: New Pt will improve  ankle PF strength to at least 15 single leg calf raises improve functional strength for walking endurance Baseline: Goal status: New Pt will improve Patient specific functional scale (PSFS) to at least 7/10 to show improved function level Baseline: Goal status: New Pt will be able to ambulate community distances at least 500 ft WNL gait pattern without complaints Baseline: Goal status: New  PLAN: PT FREQUENCY: 1-3 times per week   PT DURATION: 6 weeks  PLANNED INTERVENTIONS (unless contraindicated): aquatic PT, Canalith repositioning, cryotherapy, Electrical stimulation, Iontophoresis with 4 mg/ml dexamethasome, Moist heat, traction, Ultrasound, gait training, Therapeutic exercise, balance training, neuromuscular re-education, patient/family education, manual techniques, passive ROM, dry needling, taping, vasopnuematic device, spinal manipulations, joint manipulations 97110-Therapeutic exercises, 97530- Therapeutic activity, 97112- Neuromuscular re-education, 97535- Self Care, and 02859- Manual therapy  PLAN FOR NEXT SESSION: foot/ankle stretching and strengthening, back strength, balance.   NEXT MD VISIT: not scheduled at eval  Redell JONELLE Moose, PT,DPT 03/26/2024, 8:54 AM

## 2024-04-01 ENCOUNTER — Ambulatory Visit: Admitting: Physical Therapy

## 2024-04-01 DIAGNOSIS — M5459 Other low back pain: Secondary | ICD-10-CM

## 2024-04-01 DIAGNOSIS — M6281 Muscle weakness (generalized): Secondary | ICD-10-CM

## 2024-04-01 DIAGNOSIS — R262 Difficulty in walking, not elsewhere classified: Secondary | ICD-10-CM

## 2024-04-01 DIAGNOSIS — M25571 Pain in right ankle and joints of right foot: Secondary | ICD-10-CM | POA: Diagnosis not present

## 2024-04-01 NOTE — Therapy (Unsigned)
 OUTPATIENT PHYSICAL THERAPY TREATMENT   Patient Name: Marie Farmer MRN: [id number] DOB:March 19, 1956, 68 y.o., female Today's Date: 04/02/2024  END OF SESSION:  PT End of Session - 04/02/24 0833     Visit Number 3    Number of Visits 12    Date for PT Re-Evaluation 05/05/24    Progress Note Due on Visit 10    PT Start Time 1100    PT Stop Time 1145    PT Time Calculation (min) 45 min    Activity Tolerance Patient tolerated treatment well    Behavior During Therapy WFL for tasks assessed/performed            Past Medical History:  Diagnosis Date   Anxiety    Asthma    DDD (degenerative disc disease)    chronic back pain   Fibromyalgia    Foot pain    GERD (gastroesophageal reflux disease)    Hypertension    IBS (irritable bowel syndrome)    Mood disorder (HCC)    PONV (postoperative nausea and vomiting)    Sleep apnea    Venous insufficiency    Wears glasses    Past Surgical History:  Procedure Laterality Date   CARPAL TUNNEL RELEASE Right 06/10/2013   Procedure: CARPAL TUNNEL RELEASE;  Surgeon: Arley JONELLE Curia, MD;  Location: [facility];  Service: Orthopedics;  Laterality: Right;   CHOLECYSTECTOMY N/A 08/19/2021   Procedure: LAPAROSCOPIC CHOLECYSTECTOMY;  Surgeon: Kallie Manuelita BROCKS, MD;  Location: [facility];  Service: General;  Laterality: N/A;   COLONOSCOPY  12/20/2011   [name]-single diminutive sessile polyp in the sigmoid colon/small interenal hemorrhoids   COLONOSCOPY N/A 11/28/2017   Procedure: COLONOSCOPY;  Surgeon: Shaaron Lamar HERO, MD;  Location: [facility];  Service: Endoscopy;  Laterality: N/A;  10:30   COLONOSCOPY WITH PROPOFOL  N/A 07/07/2021   one 9 mm polyp in rectum s/p removal (serrated adenoma). surveillance in 5 years   ESOPHAGOGASTRODUODENOSCOPY  04/10/2003   Normal esophagus/Antral erosions, as described above.  The remainder of the stomach appeared normal.  Normal first and second parts of the duodenum.    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   Other specified nontoxic goiter 12/12/2021   Anemia of chronic disease 08/02/2021   Ankle impingement syndrome, left 03/25/2020   Osteochondritis dissecans of talus, left 03/25/2020   Instability of left ankle joint 02/03/2020   Plantar fasciitis 04/18/2018   Aphthous ulcer of mouth 04/02/2018   Difficulty sleeping 04/02/2018   Under emotional stress 04/02/2018   OSA on CPAP 02/01/2018   Dyspnea on exertion 10/16/2016   Essential hypertension 10/16/2016   Morbid obesity due to excess calories (HCC) 10/16/2016   IBS (irritable bowel syndrome) 02/29/2012   Tibial plateau fracture 05/10/2011   Helicobacter pylori infection 08/25/2009   DEPRESSION 08/25/2009   Asthma 08/25/2009   GASTROESOPHAGEAL REFLUX DISEASE, CHRONIC 08/25/2009   Allergy  08/25/2009   Cholelithiasis 08/25/2009    PCP: Rosamond Leta NOVAK, MD   REFERRING  PROVIDER:  Loel Lais D, DPM   REFERRING DIAG:  M79.2 (ICD-10-CM) - Neuritis  M72.2 (ICD-10-CM) - Plantar fasciitis of right foot  M76.821 (ICD-10-CM) - Posterior tibial tendinitis, right    Rationale for Evaluation and Treatment:  Rehabiliation  THERAPY DIAG:  Pain in right ankle and joints of right foot  Muscle weakness (generalized)  Difficulty in walking, not elsewhere classified  Other low back pain  ONSET DATE: 1.5 year onset of pain but worse since  may 2025   SUBJECTIVE:                                                                                                                                                                                           SUBJECTIVE STATEMENT: Relays some pain in the right foot today.   PERTINENT HISTORY:  See above PMH  PAIN:  NPRS scale: 3-4/10 upon arrival Pain location:Rt foot at metatarsal head and heel.  Pain description: constant, achy, sharp Aggravating factors: driving, standing, walking Relieving factors: rest, meds   PRECAUTIONS: ,  None  RED FLAGS: None   WEIGHT BEARING RESTRICTIONS:  No  FALLS:  Has patient fallen in last 6 months? Yes. Number of falls 1, does feel she is losing some balance.    PLOF:  Independent with basic ADLs, does have to get help with lifting water , can't climb ladder.   PATIENT GOALS:  Reduce pain.   OBJECTIVE:  Note: Objective measures were completed at Evaluation unless otherwise noted.  DIAGNOSTIC FINDINGS:  -X-rays obtained reviewed the right foot.  3 views were obtained.  Hardware intact from prior surgery.  Minimal calcaneal spurring is present posteriorly.  X-ray changes present of the third MTPJ as well as along the midfoot.  Radiographic Exam (right foot 3 weightbearing views obtained on 03/11/2024 were reviewed):  Normal osseous mineralization.  Previous fusion of the first metatarsal-medial cuneiform joint with 2 screws in place.  Also a small screw in the head of the second metatarsal.  There is an enlarged first intermetatarsal angle.  There is a navicular-medial cuneiform fault noted on the lateral view.  Lumbar MRI IMPRESSION: Small spondylitic disc herniations at L3-L4, L4-L5 and L5-S1 as described above.  PATIENT SURVEYS:  Patient-Specific Activity Scoring Scheme  0 represents "unable to perform." 10 represents "able to perform at prior level. 0 1 2 3 4 5 6 7 8 9  10 (Date and Score)   Activity Eval     1. walking  1     2. Driving for one hour  1    3.     4.    5.    Score  1/10    Total score = sum of the activity scores/number of activities Minimum detectable change (90%CI) for average score = 2 points Minimum detectable change (90%CI) for single activity score = 3 points     EDEMA:  Yes: moderate swelling in feet/ankles   GAIT: Distance walked: limited communitiy distances due to pain Assistive device utilized: None Level of assistance: Complete Independence Comments: increased foot ER due to tight heel cords  Ankle   LOWER EXTREMITY MMT    Strength MMT Right eval Left eval  Hip flexion    Hip extension    Hip abduction    Hip adduction    Hip internal rotation    Hip external rotation    Knee flexion 4+ 4+  Knee extension 4+ 4+  Ankle dorsiflexion 4+ 4+  Ankle plantarflexion 4 11 single leg calf raises 4 8 single leg calf raises  Ankle inversion 4+ 4+  Ankle eversion 4+ 4+   (Blank rows = not tested)   LOWER EXTREMITY ROM  AROM Right eval Left eval  Hip flexion    Hip extension    Hip abduction    Hip adduction    Hip internal rotation    Hip external rotation    Knee flexion    Knee extension    Ankle dorsiflexion 5 3  Ankle plantarflexion WNL WNL  Ankle inversion WNL WNL  Ankle eversion 15 15   (Blank rows = not tested)   FUNCTIONAL TESTS:  Eval: SLS 20 second avg, Tandem stance 30 second average, increased sway noted                                                                                                                              TREATMENT DATE:  7/22/2 Therex Standing gastroc stretch 30 sec X 3 bilat off step Standing soleus stretch 30 sec X 3 bilat off step True X ride seated eliptical X 10 min L3 UE/LE  Theractivity Eccentric heel raises off step up with both and slow lower with both into deep ROM 2X10 Leg press DL 64# 7K89 Leg extension machine 15# 2X10 DL Hamstring curl machine 25# DL 7K89 DL  Manual KT tape to bilat  feet/ankle from plantarfascia insertion, over achillies and up calf  03/26/24 Self care HEP review and education, cuing provided, answered her questions about them Gym equipment review with sets/reps, training schedule, and modifications needed that should be safe for her to do at [facility] to supplement her progress with PT  Therex Standing gastroc stretch 30 sec X 3 bilat Standing soleus stretch 30 sec X 3 bilat Eccentric heel raises, up with 2, down with 1, X 10 reps bilat Tandem balance 30 sec X 3 bilat Single leg balance 20 sec X 2 bilat Sit to stands no UE support X 10 reps Nu step X 10 min L5 UE/LE  Theractivity Leg press DL 59# 7K89 Calf raise machine 20# 2X10  Back extension machine  55# 2X10 Abdominal machine 25# 2X10    PATIENT EDUCATION: Education details: HEP, PT plan of care, selfcare Person educated: Patient Education method: Explanation, Demonstration, Verbal cues, and Handouts Education comprehension: verbalized understanding, further education recommended   HOME EXERCISE PROGRAM: Access Code: [email or web address] URL: [email or web address] Date: 03/24/2024 Prepared by: Redell Moose  Exercises - Gastroc Stretch on Wall  - 1 x daily - 3 x weekly - 1 sets - 3 reps - 30 hold - Soleus Stretch on Wall  - 2 x daily - 6 x weekly - 3 sets - 30 hold - Standing Eccentric Heel Raise  - 2 x daily - 6 x weekly - 3 sets - 10 reps - Sit to Stand Without Arm Support  - 2 x daily - 6 x weekly - 2 sets - 5 reps - Tandem Stance  - 2 x daily - 6 x weekly - 1 sets - 2-3 reps - 30 sec hold - Standing Single Leg Stance with Counter Support  - 2 x daily - 6 x weekly - 1 sets - 3 reps - 20 sec hold  ASSESSMENT:  CLINICAL IMPRESSION: Adjusted her HEP some to perform her stretches and heel raises off step. She relays this even felt better for her. I did try KT tape today to see if this will help with the pain any   OBJECTIVE IMPAIRMENTS: decreased activity tolerance for ADL's,  difficulty walking, decreased balance, decreased endurance, decreased mobility, decreased ROM, decreased strength, impaired flexibility, impaired ankle use, and pain.  ACTIVITY LIMITATIONS: bending, lifting, carry, locomotion, cleaning, community activity, driving.  PERSONAL FACTORS: see above PMH  also affecting patient's functional outcome.  REHAB POTENTIAL: Good  CLINICAL DECISION MAKING: Evolving/moderate complexity  EVALUATION COMPLEXITY: Moderate    GOALS: Short term PT Goals Target date: 04/21/2024   Pt will be I and compliant with HEP. Baseline:  Goal status: New Pt will decrease pain by 25% overall with walking 10 minutes Baseline: Goal status: New  Long term PT goals Target date:05/05/2024   Pt will improve ankle DF ROM >7 deg to improve functional mobility Baseline: Goal status: New Pt will improve  ankle PF strength to at least 15 single leg calf raises improve functional strength for walking endurance Baseline: Goal status: New Pt will improve Patient specific functional scale (PSFS) to at least 7/10 to show improved function level Baseline: Goal status: New Pt will be able to ambulate community distances at least 500 ft WNL gait pattern without complaints Baseline: Goal status: New  PLAN: PT FREQUENCY: 1-3 times per week   PT DURATION: 6 weeks  PLANNED INTERVENTIONS (unless contraindicated): aquatic PT, Canalith repositioning, cryotherapy, Electrical stimulation, Iontophoresis with 4 mg/ml dexamethasome, Moist heat, traction, Ultrasound, gait training, Therapeutic exercise, balance training, neuromuscular re-education, patient/family education, manual techniques, passive ROM, dry needling, taping, vasopnuematic device, spinal manipulations, joint manipulations 97110-Therapeutic exercises, 97530- Therapeutic activity, 97112- Neuromuscular re-education, 97535- Self Care, and 02859- Manual therapy  PLAN FOR NEXT SESSION: f How was KT tape? foot/ankle  stretching and strengthening, back strength, balance.   NEXT MD VISIT: not scheduled at eval  Redell JONELLE Moose, PT,DPT 04/02/2024, 8:34 AM

## 2024-04-02 ENCOUNTER — Encounter: Payer: Self-pay | Admitting: Physical Therapy

## 2024-04-03 ENCOUNTER — Ambulatory Visit: Admitting: Physical Therapy

## 2024-04-03 ENCOUNTER — Encounter: Payer: Self-pay | Admitting: Physical Therapy

## 2024-04-03 DIAGNOSIS — M25571 Pain in right ankle and joints of right foot: Secondary | ICD-10-CM

## 2024-04-03 DIAGNOSIS — M6281 Muscle weakness (generalized): Secondary | ICD-10-CM

## 2024-04-03 DIAGNOSIS — M5459 Other low back pain: Secondary | ICD-10-CM

## 2024-04-03 DIAGNOSIS — G4733 Obstructive sleep apnea (adult) (pediatric): Secondary | ICD-10-CM | POA: Diagnosis not present

## 2024-04-03 DIAGNOSIS — F341 Dysthymic disorder: Secondary | ICD-10-CM | POA: Diagnosis not present

## 2024-04-03 DIAGNOSIS — R262 Difficulty in walking, not elsewhere classified: Secondary | ICD-10-CM

## 2024-04-03 NOTE — Therapy (Signed)
 OUTPATIENT PHYSICAL THERAPY TREATMENT   Patient Name: Marie Farmer MRN: [id number] DOB:July 13, 1956, 68 y.o., female Today's Date: 04/03/2024  END OF SESSION:  PT End of Session - 04/03/24 1116     Visit Number 4    Number of Visits 12    Date for PT Re-Evaluation 05/05/24    Progress Note Due on Visit 10    PT Start Time 1057    PT Stop Time 1142    PT Time Calculation (min) 45 min    Activity Tolerance Patient tolerated treatment well    Behavior During Therapy WFL for tasks assessed/performed            Past Medical History:  Diagnosis Date   Anxiety    Asthma    DDD (degenerative disc disease)    chronic back pain   Fibromyalgia    Foot pain    GERD (gastroesophageal reflux disease)    Hypertension    IBS (irritable bowel syndrome)    Mood disorder (HCC)    PONV (postoperative nausea and vomiting)    Sleep apnea    Venous insufficiency    Wears glasses    Past Surgical History:  Procedure Laterality Date   CARPAL TUNNEL RELEASE Right 06/10/2013   Procedure: CARPAL TUNNEL RELEASE;  Surgeon: Arley JONELLE Curia, MD;  Location: [facility];  Service: Orthopedics;  Laterality: Right;   CHOLECYSTECTOMY N/A 08/19/2021   Procedure: LAPAROSCOPIC CHOLECYSTECTOMY;  Surgeon: Kallie Manuelita BROCKS, MD;  Location: [facility];  Service: General;  Laterality: N/A;   COLONOSCOPY  12/20/2011   [name]-single diminutive sessile polyp in the sigmoid colon/small interenal hemorrhoids   COLONOSCOPY N/A 11/28/2017   Procedure: COLONOSCOPY;  Surgeon: Shaaron Lamar HERO, MD;  Location: [facility];  Service: Endoscopy;  Laterality: N/A;  10:30   COLONOSCOPY WITH PROPOFOL  N/A 07/07/2021   one 9 mm polyp in rectum s/p removal (serrated adenoma). surveillance in 5 years   ESOPHAGOGASTRODUODENOSCOPY  04/10/2003   Normal esophagus/Antral erosions, as described above.  The remainder of the stomach appeared normal.  Normal first and second parts of the duodenum.    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   Other specified nontoxic goiter 12/12/2021   Anemia of chronic disease 08/02/2021   Ankle impingement syndrome, left 03/25/2020   Osteochondritis dissecans of talus, left 03/25/2020   Instability of left ankle joint 02/03/2020   Plantar fasciitis 04/18/2018   Aphthous ulcer of mouth 04/02/2018   Difficulty sleeping 04/02/2018   Under emotional stress 04/02/2018   OSA on CPAP 02/01/2018   Dyspnea on exertion 10/16/2016   Essential hypertension 10/16/2016   Morbid obesity due to excess calories (HCC) 10/16/2016   IBS (irritable bowel syndrome) 02/29/2012   Tibial plateau fracture 05/10/2011   Helicobacter pylori infection 08/25/2009   DEPRESSION 08/25/2009   Asthma 08/25/2009   GASTROESOPHAGEAL REFLUX DISEASE, CHRONIC 08/25/2009   Allergy  08/25/2009   Cholelithiasis 08/25/2009    PCP: Rosamond Leta NOVAK, MD   REFERRING  PROVIDER:  Loel Awanda BIRCH, DPM   REFERRING DIAG:  M79.2 (ICD-10-CM) - Neuritis  M72.2 (ICD-10-CM) - Plantar fasciitis of right foot  M76.821 (ICD-10-CM) - Posterior tibial tendinitis, right    Rationale for Evaluation and Treatment:  Rehabiliation  THERAPY DIAG:  Pain in right ankle and joints of right foot  Muscle weakness (generalized)  Difficulty in walking, not elsewhere classified  Other low back pain  ONSET DATE: 1.5 year onset of pain but worse since  may 2025   SUBJECTIVE:                                                                                                                                                                                           SUBJECTIVE STATEMENT: Relays pain is some better today in her feet, does have some pain in her right hip  PERTINENT HISTORY:  See above PMH  PAIN:  NPRS scale: 2-3/10 upon arrival Pain location:Rt foot at metatarsal head and heel.  Pain description: constant, achy, sharp Aggravating factors: driving, standing, walking Relieving factors: rest, meds   PRECAUTIONS: ,  None  RED FLAGS: None   WEIGHT BEARING RESTRICTIONS:  No  FALLS:  Has patient fallen in last 6 months? Yes. Number of falls 1, does feel she is losing some balance.    PLOF:  Independent with basic ADLs, does have to get help with lifting water , can't climb ladder.   PATIENT GOALS:  Reduce pain.   OBJECTIVE:  Note: Objective measures were completed at Evaluation unless otherwise noted.  DIAGNOSTIC FINDINGS:  -X-rays obtained reviewed the right foot.  3 views were obtained.  Hardware intact from prior surgery.  Minimal calcaneal spurring is present posteriorly.  X-ray changes present of the third MTPJ as well as along the midfoot.  Radiographic Exam (right foot 3 weightbearing views obtained on 03/11/2024 were reviewed):  Normal osseous mineralization.  Previous fusion of the first metatarsal-medial cuneiform joint with 2 screws in place.  Also a small screw in the head of the second metatarsal.  There is an enlarged first intermetatarsal angle.  There is a navicular-medial cuneiform fault noted on the lateral view.  Lumbar MRI IMPRESSION: Small spondylitic disc herniations at L3-L4, L4-L5 and L5-S1 as described above.  PATIENT SURVEYS:  Patient-Specific Activity Scoring Scheme  0 represents "unable to perform." 10 represents "able to perform at prior level. 0 1 2 3 4 5 6 7 8 9  10 (Date and  Score)   Activity Eval     1. walking  1    2. Driving for one hour  1    3.     4.  5.    Score 1/10    Total score = sum of the activity scores/number of activities Minimum detectable change (90%CI) for average score = 2 points Minimum detectable change (90%CI) for single activity score = 3 points     EDEMA:  Yes: moderate swelling in feet/ankles   GAIT: Distance walked: limited communitiy distances due to pain Assistive device utilized: None Level of assistance: Complete Independence Comments: increased foot ER due to tight heel cords  Ankle   LOWER EXTREMITY MMT    Strength MMT Right eval Left eval  Hip flexion    Hip extension    Hip abduction    Hip adduction    Hip internal rotation    Hip external rotation    Knee flexion 4+ 4+  Knee extension 4+ 4+  Ankle dorsiflexion 4+ 4+  Ankle plantarflexion 4 11 single leg calf raises 4 8 single leg calf raises  Ankle inversion 4+ 4+  Ankle eversion 4+ 4+   (Blank rows = not tested)   LOWER EXTREMITY ROM  AROM Right eval Left eval  Hip flexion    Hip extension    Hip abduction    Hip adduction    Hip internal rotation    Hip external rotation    Knee flexion    Knee extension    Ankle dorsiflexion 5 3  Ankle plantarflexion WNL WNL  Ankle inversion WNL WNL  Ankle eversion 15 15   (Blank rows = not tested)   FUNCTIONAL TESTS:  Eval: SLS 20 second avg, Tandem stance 30 second average, increased sway noted                                                                                                                              TREATMENT DATE:  7/24/2 Therex Standing gastroc stretch 30 sec X 3 bilat off step Standing soleus stretch 30 sec X 3 bilat off step Nu step seated eliptical X 10 min L3 UE/LE  Theractivity (strength for ADL's) Eccentric heel raises off step up with both and slow lower with both into deep ROM 2X10 Leg press DL 64# 7K89 Leg extension machine 15# 2X10  DL Hamstring curl machine 25# DL 7K89 DL Calf raise machine 74# DL 7K89  Manual KT tape to bilat feet/ankle from plantarfascia insertion, over achillies and up calf  7/22/2 Therex Standing gastroc stretch 30 sec X 3 bilat off step Standing soleus stretch 30 sec X 3 bilat off step True X ride seated eliptical X 10 min L3 UE/LE  Theractivity Eccentric heel raises off step up with both and slow lower with both into deep ROM 2X10 Leg press DL 64# 7K89 Leg extension machine 15# 2X10 DL Hamstring curl machine 25# DL 7K89 DL  Manual KT tape to bilat feet/ankle from plantarfascia insertion, over achillies and up calf  03/26/24 Self care HEP review and education, cuing provided, answered her questions about them Gym equipment review with sets/reps, training  schedule, and modifications needed that should be safe for her to do at [facility] to supplement her progress with PT  Therex Standing gastroc stretch 30 sec X 3 bilat Standing soleus stretch 30 sec X 3 bilat Eccentric heel raises, up with 2, down with 1, X 10 reps bilat Tandem balance 30 sec X 3 bilat Single leg balance 20 sec X 2 bilat Sit to stands no UE support X 10 reps Nu step X 10 min L5 UE/LE  Theractivity Leg press DL 59# 7K89 Calf raise machine 20# 2X10  Back extension machine 55# 2X10 Abdominal machine 25# 2X10    PATIENT EDUCATION: Education details: HEP, PT plan of care, selfcare Person educated: Patient Education method: Explanation, Demonstration, Verbal cues, and Handouts Education comprehension: verbalized understanding, further education recommended   HOME EXERCISE PROGRAM: Access Code: [email or web address] URL: [email or web address] Date: 03/24/2024 Prepared by: Redell Moose  Exercises - Gastroc Stretch on Wall  - 1 x daily - 3 x weekly - 1 sets - 3 reps - 30 hold - Soleus Stretch on Wall  - 2 x daily - 6 x weekly - 3 sets - 30 hold - Standing Eccentric Heel Raise  - 2 x daily - 6 x weekly - 3 sets  - 10 reps - Sit to Stand Without Arm Support  - 2 x daily - 6 x weekly - 2 sets - 5 reps - Tandem Stance  - 2 x daily - 6 x weekly - 1 sets - 2-3 reps - 30 sec hold - Standing Single Leg Stance with Counter Support  - 2 x daily - 6 x weekly - 1 sets - 3 reps - 20 sec hold  ASSESSMENT:  CLINICAL IMPRESSION: New HEP adjustments appear to be working better and less pain today in her feet. KT tape may have helped she reports so continued with this again as well.   OBJECTIVE IMPAIRMENTS: decreased activity tolerance for ADL's, difficulty walking, decreased balance, decreased endurance, decreased mobility, decreased ROM, decreased strength, impaired flexibility, impaired ankle use, and pain.  ACTIVITY LIMITATIONS: bending, lifting, carry, locomotion, cleaning, community activity, driving.  PERSONAL FACTORS: see above PMH  also affecting patient's functional outcome.  REHAB POTENTIAL: Good  CLINICAL DECISION MAKING: Evolving/moderate complexity  EVALUATION COMPLEXITY: Moderate    GOALS: Short term PT Goals Target date: 04/21/2024   Pt will be I and compliant with HEP. Baseline:  Goal status: New Pt will decrease pain by 25% overall with walking 10 minutes Baseline: Goal status: New  Long term PT goals Target date:05/05/2024   Pt will improve ankle DF ROM >7 deg to improve functional mobility Baseline: Goal status: New Pt will improve  ankle PF strength to at least 15 single leg calf raises improve functional strength for walking endurance Baseline: Goal status: New Pt will improve Patient specific functional scale (PSFS) to at least 7/10 to show improved function level Baseline: Goal status: New Pt will be able to ambulate community distances at least 500 ft WNL gait pattern without complaints Baseline: Goal status: New  PLAN: PT FREQUENCY: 1-3 times per week   PT DURATION: 6 weeks  PLANNED INTERVENTIONS (unless contraindicated): aquatic PT, Canalith repositioning,  cryotherapy, Electrical stimulation, Iontophoresis with 4 mg/ml dexamethasome, Moist heat, traction, Ultrasound, gait training, Therapeutic exercise, balance training, neuromuscular re-education, patient/family education, manual techniques, passive ROM, dry needling, taping, vasopnuematic device, spinal manipulations, joint manipulations 97110-Therapeutic exercises, 97530- Therapeutic activity, [id number]- Neuromuscular re-education, 97535- Self Care, and 02859- Manual therapy  PLAN FOR NEXT SESSION: update  measurments foot/ankle stretching and strengthening, back strength, balance.   NEXT MD VISIT: not scheduled at eval  Redell JONELLE Moose, PT,DPT 04/03/2024, 11:19 AM

## 2024-04-07 ENCOUNTER — Ambulatory Visit: Admitting: Physical Therapy

## 2024-04-07 ENCOUNTER — Encounter: Payer: Self-pay | Admitting: Physical Therapy

## 2024-04-07 DIAGNOSIS — M6281 Muscle weakness (generalized): Secondary | ICD-10-CM

## 2024-04-07 DIAGNOSIS — M25571 Pain in right ankle and joints of right foot: Secondary | ICD-10-CM | POA: Diagnosis not present

## 2024-04-07 DIAGNOSIS — M5459 Other low back pain: Secondary | ICD-10-CM

## 2024-04-07 DIAGNOSIS — R262 Difficulty in walking, not elsewhere classified: Secondary | ICD-10-CM

## 2024-04-07 NOTE — Therapy (Signed)
 OUTPATIENT PHYSICAL THERAPY TREATMENT   Patient Name: Marie Farmer MRN: [id number] DOB:30-Jul-1956, 68 y.o., female Today's Date: 04/07/2024  END OF SESSION:  PT End of Session - 04/07/24 1021     Visit Number 5    Number of Visits 12    Date for PT Re-Evaluation 05/05/24    Progress Note Due on Visit 10    PT Start Time 1010    PT Stop Time 1110    PT Time Calculation (min) 60 min    Activity Tolerance Patient tolerated treatment well    Behavior During Therapy WFL for tasks assessed/performed            Past Medical History:  Diagnosis Date   Anxiety    Asthma    DDD (degenerative disc disease)    chronic back pain   Fibromyalgia    Foot pain    GERD (gastroesophageal reflux disease)    Hypertension    IBS (irritable bowel syndrome)    Mood disorder (HCC)    PONV (postoperative nausea and vomiting)    Sleep apnea    Venous insufficiency    Wears glasses    Past Surgical History:  Procedure Laterality Date   CARPAL TUNNEL RELEASE Right 06/10/2013   Procedure: CARPAL TUNNEL RELEASE;  Surgeon: Arley JONELLE Curia, MD;  [facility]:  [facility];  Service: Orthopedics;  Laterality: Right;   CHOLECYSTECTOMY N/A 08/19/2021   Procedure: LAPAROSCOPIC CHOLECYSTECTOMY;  Surgeon: Kallie Manuelita BROCKS, MD;  Location: [facility];  Service: General;  Laterality: N/A;   COLONOSCOPY  12/20/2011   [name]-single diminutive sessile polyp in the sigmoid colon/small interenal hemorrhoids   COLONOSCOPY N/A 11/28/2017   Procedure: COLONOSCOPY;  Surgeon: Shaaron Lamar HERO, MD;  Location: [facility];  Service: Endoscopy;  Laterality: N/A;  10:30   COLONOSCOPY WITH PROPOFOL  N/A 07/07/2021   one 9 mm polyp in rectum s/p removal (serrated adenoma). surveillance in 5 years   ESOPHAGOGASTRODUODENOSCOPY  04/10/2003   Normal esophagus/Antral erosions, as described above.  The remainder of the stomach appeared normal.  Normal first and second parts of the duodenum.    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   Other specified nontoxic goiter 12/12/2021   Anemia of chronic disease 08/02/2021   Ankle impingement syndrome, left 03/25/2020   Osteochondritis dissecans of talus, left 03/25/2020   Instability of left ankle joint 02/03/2020   Plantar fasciitis 04/18/2018   Aphthous ulcer of mouth 04/02/2018   Difficulty sleeping 04/02/2018   Under emotional stress 04/02/2018   OSA on CPAP 02/01/2018   Dyspnea on exertion 10/16/2016   Essential hypertension 10/16/2016   Morbid obesity due to excess calories (HCC) 10/16/2016   IBS (irritable bowel syndrome) 02/29/2012   Tibial plateau fracture 05/10/2011   Helicobacter pylori infection 08/25/2009   DEPRESSION 08/25/2009   Asthma 08/25/2009   GASTROESOPHAGEAL REFLUX DISEASE, CHRONIC 08/25/2009   Allergy  08/25/2009   Cholelithiasis 08/25/2009    PCP: Rosamond Leta NOVAK, MD   REFERRING  PROVIDER:  Loel Awanda BIRCH, DPM   REFERRING DIAG:  M79.2 (ICD-10-CM) - Neuritis  M72.2 (ICD-10-CM) - Plantar fasciitis of right foot  M76.821 (ICD-10-CM) - Posterior tibial tendinitis, right    Rationale for Evaluation and Treatment:  Rehabiliation  THERAPY DIAG:  Pain in right ankle and joints of right foot  Muscle weakness (generalized)  Difficulty in walking, not elsewhere classified  Other low back pain  ONSET DATE: 1.5 year onset of pain but worse since  may 2025   SUBJECTIVE:                                                                                                                                                                                           SUBJECTIVE STATEMENT: Relays no pain upon arrival in her feet, is having some pain in her low back today  PERTINENT HISTORY:  See above PMH  PAIN:  NPRS scale: 0/10 in feet, 4 in low back Pain location:Rt foot at metatarsal head and heel.  Pain description: constant, achy, sharp Aggravating factors: driving, standing, walking Relieving factors: rest, meds   PRECAUTIONS: ,  None  RED FLAGS: None   WEIGHT BEARING RESTRICTIONS:  No  FALLS:  Has patient fallen in last 6 months? Yes. Number of falls 1, does feel she is losing some balance.    PLOF:  Independent with basic ADLs, does have to get help with lifting water , can't climb ladder.   PATIENT GOALS:  Reduce pain.   OBJECTIVE:  Note: Objective measures were completed at Evaluation unless otherwise noted.  DIAGNOSTIC FINDINGS:  -X-rays obtained reviewed the right foot.  3 views were obtained.  Hardware intact from prior surgery.  Minimal calcaneal spurring is present posteriorly.  X-ray changes present of the third MTPJ as well as along the midfoot.  Radiographic Exam (right foot 3 weightbearing views obtained on 03/11/2024 were reviewed):  Normal osseous mineralization.  Previous fusion of the first metatarsal-medial cuneiform joint with 2 screws in place.  Also a small screw in the head of the second metatarsal.  There is an enlarged first intermetatarsal angle.  There is a navicular-medial cuneiform fault noted on the lateral view.  Lumbar MRI IMPRESSION: Small spondylitic disc herniations at L3-L4, L4-L5 and L5-S1 as described above.  PATIENT SURVEYS:  Patient-Specific Activity Scoring Scheme  0 represents "unable to perform." 10 represents "able to perform at prior level. 0 1 2 3 4 5 6 7 8 9  10 (Date  and Score)   Activity Eval     1. walking  1    2. Driving for one hour  1    3.  4.    5.    Score 1/10    Total score = sum of the activity scores/number of activities Minimum detectable change (90%CI) for average score = 2 points Minimum detectable change (90%CI) for single activity score = 3 points     EDEMA:  Yes: moderate swelling in feet/ankles   GAIT: Distance walked: limited communitiy distances due to pain Assistive device utilized: None Level of assistance: Complete Independence Comments: increased foot ER due to tight heel cords  Ankle   LOWER EXTREMITY MMT    Strength MMT Right eval Left eval Rt/Lt 04/07/24  Hip flexion     Hip extension     Hip abduction     Hip adduction     Hip internal rotation     Hip external rotation     Knee flexion 4+ 4+ 5/5  Knee extension 4+ 4+ 5/5  Ankle dorsiflexion 4+ 4+ 5/5  Ankle plantarflexion 4 11 single leg calf raises 4 8 single leg calf raises 5/5  Ankle inversion 4+ 4+ 5/55/5  Ankle eversion 4+ 4+    (Blank rows = not tested)   LOWER EXTREMITY ROM  AROM Right eval Left eval Rt/Lt 04/07/24  Hip flexion     Hip extension     Hip abduction     Hip adduction     Hip internal rotation     Hip external rotation     Knee flexion     Knee extension     Ankle dorsiflexion 5 3 8/7  Ankle plantarflexion WNL WNL   Ankle inversion WNL WNL   Ankle eversion 15 15 23/20   (Blank rows = not tested)   FUNCTIONAL TESTS:  Eval: SLS 20 second avg, Tandem stance 30 second average, increased sway noted                                                                                                                              TREATMENT DATE:  04/07/24 Therex Standing gastroc stretch 30 sec X 3 bilat off step Standing soleus stretch 30 sec X 3 bilat off step Nu step seated eliptical X 10 min L3 UE/LE Standing lumbar extensions at wall X 10, holding 3 sec Seated slump stretch X 10 bilat Supine LTR 10 sec  X 5 bilat Supine bridges 5 sec hold X 15  Theractivity (strength for ADL's) Eccentric heel raises off step up with both and slow lower with both into deep ROM 2X10 Leg press DL 64# 7K89 Leg extension machine 15# 2X10 DL Hamstring curl machine 25# DL 7K89 DL Back extension machine 25# DL 7K89  Manual KT tape to bilat feet/ankle from plantarfascia insertion, over achillies and up calf  7/24/2 Therex Standing gastroc stretch 30 sec X 3 bilat off step Standing soleus stretch 30 sec X 3 bilat off step Nu step seated eliptical X 10 min L3 UE/LE  Theractivity (strength for ADL's) Eccentric heel raises off step up with  both and slow lower with both into deep ROM 2X10 Leg press DL 64# 7K89 Leg extension machine 15# 2X10 DL Hamstring curl machine 25# DL 7K89 DL Calf raise machine 74# DL 7K89  Manual KT tape to bilat feet/ankle from plantarfascia insertion, over achillies and up calf  7/22/2 Therex Standing gastroc stretch 30 sec X 3 bilat off step Standing soleus stretch 30 sec X 3 bilat off step True X ride seated eliptical X 10 min L3 UE/LE  Theractivity Eccentric heel raises off step up with both and slow lower with both into deep ROM 2X10 Leg press DL 64# 7K89 Leg extension machine 15# 2X10 DL Hamstring curl machine 25# DL 7K89 DL  Manual KT tape to bilat feet/ankle from plantarfascia insertion, over achillies and up calf  03/26/24 Self care HEP review and education, cuing provided, answered her questions about them Gym equipment review with sets/reps, training schedule, and modifications needed that should be safe for her to do at [facility] to supplement her progress with PT  Therex Standing gastroc stretch 30 sec X 3 bilat Standing soleus stretch 30 sec X 3 bilat Eccentric heel raises, up with 2, down with 1, X 10 reps bilat Tandem balance 30 sec X 3 bilat Single leg balance 20 sec X 2 bilat Sit to stands no UE support X 10 reps Nu step X 10 min L5  UE/LE  Theractivity Leg press DL 59# 7K89 Calf raise machine 20# 2X10  Back extension machine 55# 2X10 Abdominal machine 25# 2X10    PATIENT EDUCATION: Education details: HEP, PT plan of care, selfcare Person educated: Patient Education method: Explanation, Demonstration, Verbal cues, and Handouts Education comprehension: verbalized understanding, further education recommended   HOME EXERCISE PROGRAM: Access Code: [email or web address] URL: [email or web address] Date: 03/24/2024 Prepared by: Redell Moose Foot/ankle program Exercises - Gastroc Stretch on Wall  - 1 x daily - 3 x weekly - 1 sets - 3 reps - 30 hold - Soleus Stretch on Wall  - 2 x daily - 6 x weekly - 3 sets - 30 hold - Standing Eccentric Heel Raise  - 2 x daily - 6 x weekly - 3 sets - 10 reps - Sit to Stand Without Arm Support  - 2 x daily - 6 x weekly - 2 sets - 5 reps - Tandem Stance  - 2 x daily - 6 x weekly - 1 sets - 2-3 reps - 30 sec hold - Standing Single Leg Stance with Counter Support  - 2 x daily - 6 x weekly - 1 sets - 3 reps - 20 sec hold  Lumbar program Access Code: [email or web address] URL: [email or web address] Date: 04/07/2024 Prepared by: Redell Moose  Exercises - Standing Lumbar Extension at Wall - Forearms  - 1-2 x daily - 6 x weekly - 1 sets - 10 reps - Slump Stretch  - 1-2 x daily - 6 x weekly - 1 sets - 10 reps - Supine Lower Trunk Rotation  - 1-2 x daily - 6 x weekly - 1 sets - 5 reps - 10 sec hold - Supine Bridge  - 2 x daily - 6 x weekly - 1 sets - 15 reps - 5 hold  ASSESSMENT:  CLINICAL IMPRESSION: Her ankle/foot pain was doing much better today but does have more complaints of low back pain so I did add in more exercises for this and provided her with HEP for her low back as well that she can do in addition to her ankle/foot exercises. She showed  good progress with her updated measurements, see above for details.   OBJECTIVE IMPAIRMENTS: decreased activity tolerance for ADL's,  difficulty walking, decreased balance, decreased endurance, decreased mobility, decreased ROM, decreased strength, impaired flexibility, impaired ankle use, and pain.  ACTIVITY LIMITATIONS: bending, lifting, carry, locomotion, cleaning, community activity, driving.  PERSONAL FACTORS: see above PMH  also affecting patient's functional outcome.  REHAB POTENTIAL: Good  CLINICAL DECISION MAKING: Evolving/moderate complexity  EVALUATION COMPLEXITY: Moderate    GOALS: Short term PT Goals Target date: 04/21/2024   Pt will be I and compliant with HEP. Baseline:  Goal status: MET 04/07/24 Pt will decrease pain by 25% overall with walking 10 minutes Baseline: Goal status: MET 04/07/24  Long term PT goals Target date:05/05/2024   Pt will improve ankle DF ROM >7 deg to improve functional mobility Baseline: Goal status: MET 04/07/24 Pt will improve  ankle PF strength to at least 15 single leg calf raises improve functional strength for walking endurance Baseline: Goal status: ongoing 04/07/24 Pt will improve Patient specific functional scale (PSFS) to at least 7/10 to show improved function level Baseline: Goal status: ongoing 04/07/24 Pt will be able to ambulate community distances at least 500 ft WNL gait pattern without complaints Baseline: Goal status: ongoing 04/07/24  PLAN: PT FREQUENCY: 1-3 times per week   PT DURATION: 6 weeks  PLANNED INTERVENTIONS (unless contraindicated): aquatic PT, Canalith repositioning, cryotherapy, Electrical stimulation, Iontophoresis with 4 mg/ml dexamethasome, Moist heat, traction, Ultrasound, gait training, Therapeutic exercise, balance training, neuromuscular re-education, patient/family education, manual techniques, passive ROM, dry needling, taping, vasopnuematic device, spinal manipulations, joint manipulations 97110-Therapeutic exercises, 97530- Therapeutic activity, 97112- Neuromuscular re-education, 97535- Self Care, and [id number]- Manual  therapy  PLAN FOR NEXT SESSION: How are new back exercises going foot/ankle stretching and strengthening, back strength, balance.   NEXT MD VISIT: not scheduled at eval  Redell JONELLE Moose, PT,DPT 04/07/2024, 11:13 AM

## 2024-04-09 ENCOUNTER — Ambulatory Visit: Admitting: Physical Therapy

## 2024-04-09 ENCOUNTER — Encounter: Payer: Self-pay | Admitting: Physical Therapy

## 2024-04-09 DIAGNOSIS — M25571 Pain in right ankle and joints of right foot: Secondary | ICD-10-CM | POA: Diagnosis not present

## 2024-04-09 DIAGNOSIS — M6281 Muscle weakness (generalized): Secondary | ICD-10-CM

## 2024-04-09 DIAGNOSIS — R262 Difficulty in walking, not elsewhere classified: Secondary | ICD-10-CM

## 2024-04-09 DIAGNOSIS — M5459 Other low back pain: Secondary | ICD-10-CM

## 2024-04-09 NOTE — Therapy (Signed)
 OUTPATIENT PHYSICAL THERAPY TREATMENT   Patient Name: Marie Farmer MRN: [id number] DOB:[birth year], 68 y.o., female Today's Date: 04/09/2024  END OF SESSION:  PT End of Session - 04/09/24 1023     Visit Number 6    Number of Visits 12    Date for PT Re-Evaluation 05/05/24    Progress Note Due on Visit 10    PT Start Time 1014    PT Stop Time 1100    PT Time Calculation (min) 46 min    Activity Tolerance Patient tolerated treatment well    Behavior During Therapy WFL for tasks assessed/performed            Past Medical History:  Diagnosis Date   Anxiety    Asthma    DDD (degenerative disc disease)    chronic back pain   Fibromyalgia    Foot pain    GERD (gastroesophageal reflux disease)    Hypertension    IBS (irritable bowel syndrome)    Mood disorder (HCC)    PONV (postoperative nausea and vomiting)    Sleep apnea    Venous insufficiency    Wears glasses    Past Surgical History:  Procedure Laterality Date   CARPAL TUNNEL RELEASE Right 06/10/2013   Procedure: CARPAL TUNNEL RELEASE;  Surgeon: Arley JONELLE Curia, MD;  Location: [facility];  Service: Orthopedics;  Laterality: Right;   CHOLECYSTECTOMY N/A 08/19/2021   Procedure: LAPAROSCOPIC CHOLECYSTECTOMY;  Surgeon: Kallie Manuelita BROCKS, MD;  Location: [facility];  Service: General;  Laterality: N/A;   COLONOSCOPY  12/20/2011   [name]-single diminutive sessile polyp in the sigmoid colon/small interenal hemorrhoids   COLONOSCOPY N/A 11/28/2017   Procedure: COLONOSCOPY;  Surgeon: Shaaron Lamar HERO, MD;  Location: [facility];  Service: Endoscopy;  Laterality: N/A;  10:30   COLONOSCOPY WITH PROPOFOL  N/A 07/07/2021   one 9 mm polyp in rectum s/p removal (serrated adenoma). surveillance in 5 years   ESOPHAGOGASTRODUODENOSCOPY  04/10/2003   Normal esophagus/Antral erosions, as described above.  The remainder of the stomach appeared normal.  Normal first and second parts of the duodenum.    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   Other specified nontoxic goiter 12/12/2021   Anemia of chronic disease 08/02/2021   Ankle impingement syndrome, left 03/25/2020   Osteochondritis dissecans of talus, left 03/25/2020   Instability of left ankle joint 02/03/2020   Plantar fasciitis 04/18/2018   Aphthous ulcer of mouth 04/02/2018   Difficulty sleeping 04/02/2018   Under emotional stress 04/02/2018   OSA on CPAP 02/01/2018   Dyspnea on exertion 10/16/2016   Essential hypertension 10/16/2016   Morbid obesity due to excess calories (HCC) 10/16/2016   IBS (irritable bowel syndrome) 02/29/2012   Tibial plateau fracture 05/10/2011   Helicobacter pylori infection 08/25/2009   DEPRESSION 08/25/2009   Asthma 08/25/2009   GASTROESOPHAGEAL REFLUX DISEASE, CHRONIC 08/25/2009   Allergy  08/25/2009   Cholelithiasis 08/25/2009    PCP: Rosamond Leta NOVAK, MD   REFERRING  PROVIDER:  Loel Awanda BIRCH, DPM   REFERRING DIAG:  M79.2 (ICD-10-CM) - Neuritis  M72.2 (ICD-10-CM) - Plantar fasciitis of right foot  M76.821 (ICD-10-CM) - Posterior tibial tendinitis, right    Rationale for Evaluation and Treatment:  Rehabiliation  THERAPY DIAG:  Pain in right ankle and joints of right foot  Muscle weakness (generalized)  Difficulty in walking, not elsewhere classified  Other low back pain  ONSET DATE: 1.5 year onset of pain but worse since  may 2025   SUBJECTIVE:                                                                                                                                                                                           SUBJECTIVE STATEMENT: Continues to report back pain, has done some yardwork  PERTINENT HISTORY:  See above PMH  PAIN:  NPRS scale: 0/10 in feet, 4 in low back Pain location:Rt foot at metatarsal head and heel.  Pain description: constant, achy, sharp Aggravating factors: driving, standing, walking Relieving factors: rest, meds   PRECAUTIONS: ,  None  RED FLAGS: None   WEIGHT BEARING RESTRICTIONS:  No  FALLS:  Has patient fallen in last 6 months? Yes. Number of falls 1, does feel she is losing some balance.    PLOF:  Independent with basic ADLs, does have to get help with lifting water , can't climb ladder.   PATIENT GOALS:  Reduce pain.   OBJECTIVE:  Note: Objective measures were completed at Evaluation unless otherwise noted.  DIAGNOSTIC FINDINGS:  -X-rays obtained reviewed the right foot.  3 views were obtained.  Hardware intact from prior surgery.  Minimal calcaneal spurring is present posteriorly.  X-ray changes present of the third MTPJ as well as along the midfoot.  Radiographic Exam (right foot 3 weightbearing views obtained on 03/11/2024 were reviewed):  Normal osseous mineralization.  Previous fusion of the first metatarsal-medial cuneiform joint with 2 screws in place.  Also a small screw in the head of the second metatarsal.  There is an enlarged first intermetatarsal angle.  There is a navicular-medial cuneiform fault noted on the lateral view.  Lumbar MRI IMPRESSION: Small spondylitic disc herniations at L3-L4, L4-L5 and L5-S1 as described above.  PATIENT SURVEYS:  Patient-Specific Activity Scoring Scheme  0 represents "unable to perform." 10 represents "able to perform at prior level. 0 1 2 3 4 5 6 7 8 9  10 (Date and Score)   Activity Eval      1. walking  1    2. Driving for one hour  1    3.     4.    5.  Score 1/10    Total score = sum of the activity scores/number of activities Minimum detectable change (90%CI) for average score = 2 points Minimum detectable change (90%CI) for single activity score = 3 points     EDEMA:  Yes: moderate swelling in feet/ankles   GAIT: Distance walked: limited communitiy distances due to pain Assistive device utilized: None Level of assistance: Complete Independence Comments: increased foot ER due to tight heel cords  Ankle   LOWER EXTREMITY MMT    Strength MMT Right eval Left eval Rt/Lt 04/07/24  Hip flexion     Hip extension     Hip abduction     Hip adduction     Hip internal rotation     Hip external rotation     Knee flexion 4+ 4+ 5/5  Knee extension 4+ 4+ 5/5  Ankle dorsiflexion 4+ 4+ 5/5  Ankle plantarflexion 4 11 single leg calf raises 4 8 single leg calf raises 5/5  Ankle inversion 4+ 4+ 5/55/5  Ankle eversion 4+ 4+    (Blank rows = not tested)   LOWER EXTREMITY ROM  AROM Right eval Left eval Rt/Lt 04/07/24  Hip flexion     Hip extension     Hip abduction     Hip adduction     Hip internal rotation     Hip external rotation     Knee flexion     Knee extension     Ankle dorsiflexion 5 3 8/7  Ankle plantarflexion WNL WNL   Ankle inversion WNL WNL   Ankle eversion 15 15 23/20   (Blank rows = not tested)   FUNCTIONAL TESTS:  Eval: SLS 20 second avg, Tandem stance 30 second average, increased sway noted                                                                                                                              TREATMENT DATE:  04/09/24 Therex Nu step seated eliptical X 10 min L3 UE/LE with moist heat to low back.  Standing gastroc stretch 30 sec X 3 bilat off step Standing soleus stretch 30 sec X 3 bilat off step Standing lumbar extensions at wall X 10, holding 3 sec Supine LTR 10 sec X 5 bilat Supine bridges 5 sec  hold X 15 Supine piriformis stretch 30 sec X 3  Theractivity (strength for ADL's) Eccentric heel raises off step up with both and slow lower with both into deep ROM 2X10 Leg press DL 59# 7K89 Leg extension machine 20# 2X10 DL Hamstring curl machine 25# DL 7K89 DL Back extension machine 40# DL 7K89  Manual KT tape to bilat feet/ankle from plantarfascia insertion, over achillies and up calf  04/07/24 Therex Standing gastroc stretch 30 sec X 3 bilat off step Standing soleus stretch 30 sec X 3 bilat off step Nu step seated eliptical X 10 min L3 UE/LE Standing lumbar extensions at wall X 10, holding 3 sec Seated slump  stretch X 10 bilat Supine LTR 10 sec X 5 bilat Supine bridges 5 sec hold X 15   Theractivity (strength for ADL's) Eccentric heel raises off step up with both and slow lower with both into deep ROM 2X10 Leg press DL 64# 7K89 Leg extension machine 15# 2X10 DL Hamstring curl machine 25# DL 7K89 DL Back extension machine 25# DL 7K89   Manual KT tape to bilat feet/ankle from plantarfascia insertion, over achillies and up calf   PATIENT EDUCATION: Education details: HEP, PT plan of care, selfcare Person educated: Patient Education method: Explanation, Demonstration, Verbal cues, and Handouts Education comprehension: verbalized understanding, further education recommended   HOME EXERCISE PROGRAM: Access Code: [email or web address] URL: [email or web address] Date: 03/24/2024 Prepared by: Redell Moose Foot/ankle program Exercises - Gastroc Stretch on Wall  - 1 x daily - 3 x weekly - 1 sets - 3 reps - 30 hold - Soleus Stretch on Wall  - 2 x daily - 6 x weekly - 3 sets - 30 hold - Standing Eccentric Heel Raise  - 2 x daily - 6 x weekly - 3 sets - 10 reps - Sit to Stand Without Arm Support  - 2 x daily - 6 x weekly - 2 sets - 5 reps - Tandem Stance  - 2 x daily - 6 x weekly - 1 sets - 2-3 reps - 30 sec hold - Standing Single Leg Stance with Counter Support  - 2 x  daily - 6 x weekly - 1 sets - 3 reps - 20 sec hold  Lumbar program Access Code: [email or web address] URL: [email or web address] Date: 04/07/2024 Prepared by: Redell Moose  Exercises - Standing Lumbar Extension at Wall - Forearms  - 1-2 x daily - 6 x weekly - 1 sets - 10 reps - Slump Stretch  - 1-2 x daily - 6 x weekly - 1 sets - 10 reps (this was changed out for piriformis stretch) - Supine Lower Trunk Rotation  - 1-2 x daily - 6 x weekly - 1 sets - 5 reps - 10 sec hold - Supine Bridge  - 2 x daily - 6 x weekly - 1 sets - 15 reps - 5 hold  ASSESSMENT:  CLINICAL IMPRESSION: She was having discomfort with the slump sciatic nerve stretch so I switched this out for piriformis stretch which worked a lot better for her and she will make this change to her HEP.   OBJECTIVE IMPAIRMENTS: decreased activity tolerance for ADL's, difficulty walking, decreased balance, decreased endurance, decreased mobility, decreased ROM, decreased strength, impaired flexibility, impaired ankle use, and pain.  ACTIVITY LIMITATIONS: bending, lifting, carry, locomotion, cleaning, community activity, driving.  PERSONAL FACTORS: see above PMH  also affecting patient's functional outcome.  REHAB POTENTIAL: Good  CLINICAL DECISION MAKING: Evolving/moderate complexity  EVALUATION COMPLEXITY: Moderate    GOALS: Short term PT Goals Target date: 04/21/2024   Pt will be I and compliant with HEP. Baseline:  Goal status: MET 04/07/24 Pt will decrease pain by 25% overall with walking 10 minutes Baseline: Goal status: MET 04/07/24  Long term PT goals Target date:05/05/2024   Pt will improve ankle DF ROM >7 deg to improve functional mobility Baseline: Goal status: MET 04/07/24 Pt will improve  ankle PF strength to at least 15 single leg calf raises improve functional strength for walking endurance Baseline: Goal status: ongoing 04/07/24 Pt will improve Patient specific functional scale (PSFS) to at least 7/10 to  show improved function level Baseline: Goal status: ongoing 04/07/24 Pt will be able  to ambulate community distances at least 500 ft WNL gait pattern without complaints Baseline: Goal status: ongoing 04/07/24  PLAN: PT FREQUENCY: 1-3 times per week   PT DURATION: 6 weeks  PLANNED INTERVENTIONS (unless contraindicated): aquatic PT, Canalith repositioning, cryotherapy, Electrical stimulation, Iontophoresis with 4 mg/ml dexamethasome, Moist heat, traction, Ultrasound, gait training, Therapeutic exercise, balance training, neuromuscular re-education, patient/family education, manual techniques, passive ROM, dry needling, taping, vasopnuematic device, spinal manipulations, joint manipulations 97110-Therapeutic exercises, 97530- Therapeutic activity, 97112- Neuromuscular re-education, 97535- Self Care, and [id number]- Manual therapy  PLAN FOR NEXT SESSION:  foot/ankle stretching and strengthening, back strength, balance.   NEXT MD VISIT: not scheduled at eval  Redell JONELLE Moose, PT,DPT 04/09/2024, 10:25 AM

## 2024-04-14 ENCOUNTER — Encounter: Payer: Self-pay | Admitting: Physical Therapy

## 2024-04-14 ENCOUNTER — Ambulatory Visit: Attending: Podiatry | Admitting: Physical Therapy

## 2024-04-14 DIAGNOSIS — R262 Difficulty in walking, not elsewhere classified: Secondary | ICD-10-CM | POA: Diagnosis not present

## 2024-04-14 DIAGNOSIS — M25571 Pain in right ankle and joints of right foot: Secondary | ICD-10-CM | POA: Insufficient documentation

## 2024-04-14 DIAGNOSIS — M5459 Other low back pain: Secondary | ICD-10-CM | POA: Diagnosis not present

## 2024-04-14 DIAGNOSIS — M6281 Muscle weakness (generalized): Secondary | ICD-10-CM | POA: Diagnosis not present

## 2024-04-14 NOTE — Therapy (Signed)
 OUTPATIENT PHYSICAL THERAPY TREATMENT   Patient Name: Marie Farmer MRN: [id number] DOB:09/08/1956, 68 y.o., female Today's Date: 04/14/2024  END OF SESSION:  PT End of Session - 04/14/24 1120     Visit Number 7    Number of Visits 12    Date for PT Re-Evaluation 05/05/24    Progress Note Due on Visit 10    PT Start Time 1100    PT Stop Time 1145    PT Time Calculation (min) 45 min    Activity Tolerance Patient tolerated treatment well    Behavior During Therapy WFL for tasks assessed/performed            Past Medical History:  Diagnosis Date   Anxiety    Asthma    DDD (degenerative disc disease)    chronic back pain   Fibromyalgia    Foot pain    GERD (gastroesophageal reflux disease)    Hypertension    IBS (irritable bowel syndrome)    Mood disorder (HCC)    PONV (postoperative nausea and vomiting)    Sleep apnea    Venous insufficiency    Wears glasses    Past Surgical History:  Procedure Laterality Date   CARPAL TUNNEL RELEASE Right 06/10/2013   Procedure: CARPAL TUNNEL RELEASE;  Surgeon: Arley JONELLE Curia, MD;  Location: [facility];  Service: Orthopedics;  Laterality: Right;   CHOLECYSTECTOMY N/A 08/19/2021   Procedure: LAPAROSCOPIC CHOLECYSTECTOMY;  Surgeon: Kallie Manuelita BROCKS, MD;  Location: [facility];  Service: General;  Laterality: N/A;   COLONOSCOPY  12/20/2011   [name]-single diminutive sessile polyp in the sigmoid colon/small interenal hemorrhoids   COLONOSCOPY N/A 11/28/2017   Procedure: COLONOSCOPY;  Surgeon: Shaaron Lamar HERO, MD;  Location: [facility];  Service: Endoscopy;  Laterality: N/A;  10:30   COLONOSCOPY WITH PROPOFOL  N/A 07/07/2021   one 9 mm polyp in rectum s/p removal (serrated adenoma). surveillance in 5 years   ESOPHAGOGASTRODUODENOSCOPY  04/10/2003   Normal esophagus/Antral erosions, as described above.  The remainder of the stomach appeared normal.  Normal first and second parts of the duodenum.    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   Other specified nontoxic goiter 12/12/2021   Anemia of chronic disease 08/02/2021   Ankle impingement syndrome, left 03/25/2020   Osteochondritis dissecans of talus, left 03/25/2020   Instability of left ankle joint 02/03/2020   Plantar fasciitis 04/18/2018   Aphthous ulcer of mouth 04/02/2018   Difficulty sleeping 04/02/2018   Under emotional stress 04/02/2018   OSA on CPAP 02/01/2018   Dyspnea on exertion 10/16/2016   Essential hypertension 10/16/2016   Morbid obesity due to excess calories (HCC) 10/16/2016   IBS (irritable bowel syndrome) 02/29/2012   Tibial plateau fracture 05/10/2011   Helicobacter pylori infection 08/25/2009   DEPRESSION 08/25/2009   Asthma 08/25/2009   GASTROESOPHAGEAL REFLUX DISEASE, CHRONIC 08/25/2009   Allergy  08/25/2009   Cholelithiasis 08/25/2009    PCP: Rosamond Leta NOVAK, MD   REFERRING  PROVIDER:  Loel Awanda BIRCH, DPM   REFERRING DIAG:  M79.2 (ICD-10-CM) - Neuritis  M72.2 (ICD-10-CM) - Plantar fasciitis of right foot  M76.821 (ICD-10-CM) - Posterior tibial tendinitis, right    Rationale for Evaluation and Treatment:  Rehabiliation  THERAPY DIAG:  Pain in right ankle and joints of right foot  Muscle weakness (generalized)  Difficulty in walking, not elsewhere classified  Other low back pain  ONSET DATE: 1.5 year onset of pain but worse since  may 2025   SUBJECTIVE:                                                                                                                                                                                           SUBJECTIVE STATEMENT: Continues to report back pain, her feet are doing okay.   PERTINENT HISTORY:  See above PMH  PAIN:  NPRS scale: 0/10 in feet, 4 in low back Pain location:Rt foot at metatarsal head and heel.  Pain description: constant, achy, sharp Aggravating factors: driving, standing, walking Relieving factors: rest, meds   PRECAUTIONS: ,  None  RED FLAGS: None   WEIGHT BEARING RESTRICTIONS:  No  FALLS:  Has patient fallen in last 6 months? Yes. Number of falls 1, does feel she is losing some balance.    PLOF:  Independent with basic ADLs, does have to get help with lifting water , can't climb ladder.   PATIENT GOALS:  Reduce pain.   OBJECTIVE:  Note: Objective measures were completed at Evaluation unless otherwise noted.  DIAGNOSTIC FINDINGS:  -X-rays obtained reviewed the right foot.  3 views were obtained.  Hardware intact from prior surgery.  Minimal calcaneal spurring is present posteriorly.  X-ray changes present of the third MTPJ as well as along the midfoot.  Radiographic Exam (right foot 3 weightbearing views obtained on 03/11/2024 were reviewed):  Normal osseous mineralization.  Previous fusion of the first metatarsal-medial cuneiform joint with 2 screws in place.  Also a small screw in the head of the second metatarsal.  There is an enlarged first intermetatarsal angle.  There is a navicular-medial cuneiform fault noted on the lateral view.  Lumbar MRI IMPRESSION: Small spondylitic disc herniations at L3-L4, L4-L5 and L5-S1 as described above.  PATIENT SURVEYS:  Patient-Specific Activity Scoring Scheme  0 represents "unable to perform." 10 represents "able to perform at prior level. 0 1 2 3 4 5 6 7 8 9  10 (Date and Score)   Activity  Eval     1. walking  1    2. Driving for one hour  1    3.     4.  5.    Score 1/10    Total score = sum of the activity scores/number of activities Minimum detectable change (90%CI) for average score = 2 points Minimum detectable change (90%CI) for single activity score = 3 points     EDEMA:  Yes: moderate swelling in feet/ankles   GAIT: Distance walked: limited communitiy distances due to pain Assistive device utilized: None Level of assistance: Complete Independence Comments: increased foot ER due to tight heel cords  Ankle   LOWER EXTREMITY MMT    Strength MMT Right eval Left eval Rt/Lt 04/07/24 Rt/Lt 04/14/24  Hip flexion      Hip extension      Hip abduction      Hip adduction      Hip internal rotation      Hip external rotation      Knee flexion 4+ 4+ 5/5   Knee extension 4+ 4+ 5/5   Ankle dorsiflexion 4+ 4+ 5/5   Ankle plantarflexion 4 11 single leg calf raises 4 8 single leg calf raises 5/5 5/5 25 single leg calf raises/25 single leg calf raises  Ankle inversion 4+ 4+ 5/55/5   Ankle eversion 4+ 4+     (Blank rows = not tested)   LOWER EXTREMITY ROM  AROM Right eval Left eval Rt/Lt 04/07/24  Hip flexion     Hip extension     Hip abduction     Hip adduction     Hip internal rotation     Hip external rotation     Knee flexion     Knee extension     Ankle dorsiflexion 5 3 8/7  Ankle plantarflexion WNL WNL   Ankle inversion WNL WNL   Ankle eversion 15 15 23/20   (Blank rows = not tested)   FUNCTIONAL TESTS:  Eval: SLS 20 second avg, Tandem stance 30 second average, increased sway noted  04/14/24 SLS 20 second avg, Tandem stance 30 second average, increased sway noted                                                                                                                               TREATMENT DATE:  04/14/24 Therex Nu step seated eliptical X 10 min L3 UE/LE with moist heat to low back.  Standing gastroc stretch 30 sec X 3  bilat off step Standing soleus stretch 30 sec X 3 bilat off step Standing lumbar extensions at wall X 10, holding 3 sec Supine LTR 10 sec X 5 bilat Supine bridges 5 sec hold X 15 Supine piriformis stretch 30 sec X 3  Theractivity (strength for ADL's) Eccentric heel raises off step up with both and slow lower with both into deep ROM 2X10 Single leg heel raises X 25 each side Leg press DL 59# 7K89 Leg extension machine 20# 2X10 DL Seated row machine 84# DL 7K89  Back extension machine 40# DL 7K89  Manual KT tape to bilat feet/ankle from plantarfascia  insertion, over achillies and up calf   04/09/24 Therex Nu step seated eliptical X 10 min L3 UE/LE with moist heat to low back.  Standing gastroc stretch 30 sec X 3 bilat off step Standing soleus stretch 30 sec X 3 bilat off step Standing lumbar extensions at wall X 10, holding 3 sec Supine LTR 10 sec X 5 bilat Supine bridges 5 sec hold X 15 Supine piriformis stretch 30 sec X 3  Theractivity (strength for ADL's) Eccentric heel raises off step up with both and slow lower with both into deep ROM 2X10 Leg press DL 59# 7K89 Leg extension machine 20# 2X10 DL Hamstring curl machine 25# DL 7K89 DL Back extension machine 40# DL 7K89  Manual KT tape to bilat feet/ankle from plantarfascia insertion, over achillies and up calf    PATIENT EDUCATION: Education details: HEP, PT plan of care, selfcare Person educated: Patient Education method: Explanation, Demonstration, Verbal cues, and Handouts Education comprehension: verbalized understanding, further education recommended   HOME EXERCISE PROGRAM: Access Code: [email or web address] URL: [email or web address] Date: 03/24/2024 Prepared by: Redell Moose Foot/ankle program Exercises - Gastroc Stretch on Wall  - 1 x daily - 3 x weekly - 1 sets - 3 reps - 30 hold - Soleus Stretch on Wall  - 2 x daily - 6 x weekly - 3 sets - 30 hold - Standing Eccentric Heel Raise  - 2 x daily - 6 x  weekly - 3 sets - 10 reps - Sit to Stand Without Arm Support  - 2 x daily - 6 x weekly - 2 sets - 5 reps - Tandem Stance  - 2 x daily - 6 x weekly - 1 sets - 2-3 reps - 30 sec hold - Standing Single Leg Stance with Counter Support  - 2 x daily - 6 x weekly - 1 sets - 3 reps - 20 sec hold  Lumbar program Access Code: [email or web address] URL: [email or web address] Date: 04/07/2024 Prepared by: Redell Moose  Exercises - Standing Lumbar Extension at Wall - Forearms  - 1-2 x daily - 6 x weekly - 1 sets - 10 reps - Slump Stretch  - 1-2 x daily - 6 x weekly - 1 sets - 10 reps (this was changed out for piriformis stretch) - Supine Lower Trunk Rotation  - 1-2 x daily - 6 x weekly - 1 sets - 5 reps - 10 sec hold - Supine Bridge  - 2 x daily - 6 x weekly - 1 sets - 15 reps - 5 hold  ASSESSMENT:  CLINICAL IMPRESSION: She showed good progress with her updated balance and calf strength tests today.    OBJECTIVE IMPAIRMENTS: decreased activity tolerance for ADL's, difficulty walking, decreased balance, decreased endurance, decreased mobility, decreased ROM, decreased strength, impaired flexibility, impaired ankle use, and pain.  ACTIVITY LIMITATIONS: bending, lifting, carry, locomotion, cleaning, community activity, driving.  PERSONAL FACTORS: see above PMH  also affecting patient's functional outcome.  REHAB POTENTIAL: Good  CLINICAL DECISION MAKING: Evolving/moderate complexity  EVALUATION COMPLEXITY: Moderate    GOALS: Short term PT Goals Target date: 04/21/2024   Pt will be I and compliant with HEP. Baseline:  Goal status: MET 04/07/24 Pt will decrease pain by 25% overall with walking 10 minutes Baseline: Goal status: MET 04/07/24  Long term PT goals Target date:05/05/2024   Pt will improve ankle DF ROM >7 deg to improve functional mobility Baseline: Goal status: MET 04/07/24 Pt will improve  ankle PF strength to at least 15 single leg calf raises  improve functional strength  for walking endurance Baseline: Goal status: ongoing 04/07/24 Pt will improve Patient specific functional scale (PSFS) to at least 7/10 to show improved function level Baseline: Goal status: ongoing 04/07/24 Pt will be able to ambulate community distances at least 500 ft WNL gait pattern without complaints Baseline: Goal status: ongoing 04/07/24  PLAN: PT FREQUENCY: 1-3 times per week   PT DURATION: 6 weeks  PLANNED INTERVENTIONS (unless contraindicated): aquatic PT, Canalith repositioning, cryotherapy, Electrical stimulation, Iontophoresis with 4 mg/ml dexamethasome, Moist heat, traction, Ultrasound, gait training, Therapeutic exercise, balance training, neuromuscular re-education, patient/family education, manual techniques, passive ROM, dry needling, taping, vasopnuematic device, spinal manipulations, joint manipulations 97110-Therapeutic exercises, 97530- Therapeutic activity, 97112- Neuromuscular re-education, 97535- Self Care, and [id number]- Manual therapy  PLAN FOR NEXT SESSION:  foot/ankle stretching and strengthening, back strength, balance.   NEXT MD VISIT: not scheduled at eval  Redell JONELLE Moose, PT,DPT 04/14/2024, 11:20 AM

## 2024-04-15 DIAGNOSIS — M9901 Segmental and somatic dysfunction of cervical region: Secondary | ICD-10-CM | POA: Diagnosis not present

## 2024-04-15 DIAGNOSIS — M47816 Spondylosis without myelopathy or radiculopathy, lumbar region: Secondary | ICD-10-CM | POA: Diagnosis not present

## 2024-04-15 DIAGNOSIS — M9902 Segmental and somatic dysfunction of thoracic region: Secondary | ICD-10-CM | POA: Diagnosis not present

## 2024-04-15 DIAGNOSIS — S134XXA Sprain of ligaments of cervical spine, initial encounter: Secondary | ICD-10-CM | POA: Diagnosis not present

## 2024-04-15 DIAGNOSIS — S233XXA Sprain of ligaments of thoracic spine, initial encounter: Secondary | ICD-10-CM | POA: Diagnosis not present

## 2024-04-15 DIAGNOSIS — M9903 Segmental and somatic dysfunction of lumbar region: Secondary | ICD-10-CM | POA: Diagnosis not present

## 2024-04-16 ENCOUNTER — Ambulatory Visit: Admitting: Physical Therapy

## 2024-04-16 ENCOUNTER — Encounter: Payer: Self-pay | Admitting: Physical Therapy

## 2024-04-16 DIAGNOSIS — M25571 Pain in right ankle and joints of right foot: Secondary | ICD-10-CM

## 2024-04-16 DIAGNOSIS — R262 Difficulty in walking, not elsewhere classified: Secondary | ICD-10-CM

## 2024-04-16 DIAGNOSIS — M6281 Muscle weakness (generalized): Secondary | ICD-10-CM

## 2024-04-16 DIAGNOSIS — M5459 Other low back pain: Secondary | ICD-10-CM

## 2024-04-16 NOTE — Therapy (Signed)
 OUTPATIENT PHYSICAL THERAPY TREATMENT   Patient Name: NIAYA HICKOK MRN: [id number] DOB:11-25-1955, 68 y.o., female Today's Date: 04/16/2024  END OF SESSION:  PT End of Session - 04/16/24 1141     Visit Number 8    Number of Visits 12    Date for PT Re-Evaluation 05/05/24    Progress Note Due on Visit 10    PT Start Time 1100    PT Stop Time 1145    PT Time Calculation (min) 45 min    Activity Tolerance Patient tolerated treatment well    Behavior During Therapy WFL for tasks assessed/performed             Past Medical History:  Diagnosis Date   Anxiety    Asthma    DDD (degenerative disc disease)    chronic back pain   Fibromyalgia    Foot pain    GERD (gastroesophageal reflux disease)    Hypertension    IBS (irritable bowel syndrome)    Mood disorder (HCC)    PONV (postoperative nausea and vomiting)    Sleep apnea    Venous insufficiency    Wears glasses    Past Surgical History:  Procedure Laterality Date   CARPAL TUNNEL RELEASE Right 06/10/2013   Procedure: CARPAL TUNNEL RELEASE;  Surgeon: Arley JONELLE Curia, MD;  Location: [facility];  Service: Orthopedics;  Laterality: Right;   CHOLECYSTECTOMY N/A 08/19/2021   Procedure: LAPAROSCOPIC CHOLECYSTECTOMY;  Surgeon: Kallie Manuelita BROCKS, MD;  Location: [facility];  Service: General;  Laterality: N/A;   COLONOSCOPY  12/20/2011   [name]-single diminutive sessile polyp in the sigmoid colon/small interenal hemorrhoids   COLONOSCOPY N/A 11/28/2017   Procedure: COLONOSCOPY;  Surgeon: Shaaron Lamar HERO, MD;  Location: [facility];  Service: Endoscopy;  Laterality: N/A;  10:30   COLONOSCOPY WITH PROPOFOL  N/A 07/07/2021   one 9 mm polyp in rectum s/p removal (serrated adenoma). surveillance in 5 years   ESOPHAGOGASTRODUODENOSCOPY  04/10/2003   Normal esophagus/Antral erosions, as described above.  The remainder of the stomach appeared normal.  Normal first and second parts of the duodenum.    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   Other specified nontoxic goiter 12/12/2021   Anemia of chronic disease 08/02/2021   Ankle impingement syndrome, left 03/25/2020   Osteochondritis dissecans of talus, left 03/25/2020   Instability of left ankle joint 02/03/2020   Plantar fasciitis 04/18/2018   Aphthous ulcer of mouth 04/02/2018   Difficulty sleeping 04/02/2018   Under emotional stress 04/02/2018   OSA on CPAP 02/01/2018   Dyspnea on exertion 10/16/2016   Essential hypertension 10/16/2016   Morbid obesity due to excess calories (HCC) 10/16/2016   IBS (irritable bowel syndrome) 02/29/2012   Tibial plateau fracture 05/10/2011   Helicobacter pylori infection 08/25/2009   DEPRESSION 08/25/2009   Asthma 08/25/2009   GASTROESOPHAGEAL REFLUX DISEASE, CHRONIC 08/25/2009   Allergy  08/25/2009   Cholelithiasis 08/25/2009    PCP: Rosamond Leta NOVAK, MD  REFERRING PROVIDER:  McCaughan, Dia D, DPM   REFERRING DIAG:  M79.2 (ICD-10-CM) - Neuritis  M72.2 (ICD-10-CM) - Plantar fasciitis of right foot  M76.821 (ICD-10-CM) - Posterior tibial tendinitis, right    Rationale for Evaluation and Treatment:  Rehabiliation  THERAPY DIAG:  Pain in right ankle and joints of right foot  Muscle weakness (generalized)  Difficulty in walking, not elsewhere classified  Other low back pain  ONSET DATE: 1.5 year onset of pain but worse since  may 2025   SUBJECTIVE:                                                                                                                                                                                           SUBJECTIVE STATEMENT: Continues to report back pain, felt like she pulled her back bending over to get pepper off the shelf at the store.   PERTINENT HISTORY:  See above PMH  PAIN:  NPRS scale: 0/10 in feet, 4 in low back Pain location:Rt foot at metatarsal head and heel.  Pain description: constant, achy, sharp Aggravating factors: driving, standing, walking Relieving factors: rest, meds   PRECAUTIONS: ,  None  RED FLAGS: None   WEIGHT BEARING RESTRICTIONS:  No  FALLS:  Has patient fallen in last 6 months? Yes. Number of falls 1, does feel she is losing some balance.    PLOF:  Independent with basic ADLs, does have to get help with lifting water , can't climb ladder.   PATIENT GOALS:  Reduce pain.   OBJECTIVE:  Note: Objective measures were completed at Evaluation unless otherwise noted.  DIAGNOSTIC FINDINGS:  -X-rays obtained reviewed the right foot.  3 views were obtained.  Hardware intact from prior surgery.  Minimal calcaneal spurring is present posteriorly.  X-ray changes present of the third MTPJ as well as along the midfoot.  Radiographic Exam (right foot 3 weightbearing views obtained on 03/11/2024 were reviewed):  Normal osseous mineralization.  Previous fusion of the first metatarsal-medial cuneiform joint with 2 screws in place.  Also a small screw in the head of the second metatarsal.  There is an enlarged first intermetatarsal angle.  There is a navicular-medial cuneiform fault noted on the lateral view.  Lumbar MRI IMPRESSION: Small spondylitic disc herniations at L3-L4, L4-L5 and L5-S1 as described above.  PATIENT SURVEYS:  Patient-Specific Activity Scoring Scheme  0 represents "unable to perform." 10 represents "able to perform at prior  level. 0 1 2 3 4 5 6 7 8 9  10 (Date and Score)   Activity Eval     1. walking  1    2. Driving for one hour  1    3.     4.    5.    Score 1/10    Total score = sum of the activity scores/number of activities Minimum detectable change (90%CI) for average score = 2 points Minimum detectable change (90%CI) for single activity score = 3 points     EDEMA:  Yes: moderate swelling in feet/ankles   GAIT: Distance walked: limited communitiy distances due to pain Assistive device utilized: None Level of assistance: Complete Independence Comments: increased foot ER due to tight heel cords  Ankle   LOWER EXTREMITY MMT    Strength MMT Right eval Left eval Rt/Lt 04/07/24 Rt/Lt 04/14/24  Hip flexion      Hip extension      Hip abduction      Hip adduction      Hip internal rotation      Hip external rotation      Knee flexion 4+ 4+ 5/5   Knee extension 4+ 4+ 5/5   Ankle dorsiflexion 4+ 4+ 5/5   Ankle plantarflexion 4 11 single leg calf raises 4 8 single leg calf raises 5/5 5/5 25 single leg calf raises/25 single leg calf raises  Ankle inversion 4+ 4+ 5/55/5   Ankle eversion 4+ 4+     (Blank rows = not tested)   LOWER EXTREMITY ROM  AROM Right eval Left eval Rt/Lt 04/07/24  Hip flexion     Hip extension     Hip abduction     Hip adduction     Hip internal rotation     Hip external rotation     Knee flexion     Knee extension     Ankle dorsiflexion 5 3 8/7  Ankle plantarflexion WNL WNL   Ankle inversion WNL WNL   Ankle eversion 15 15 23/20   (Blank rows = not tested)   FUNCTIONAL TESTS:  Eval: SLS 20 second avg, Tandem stance 30 second average, increased sway noted  04/14/24 SLS 20 second avg, Tandem stance 30 second average, increased sway noted                                                                                                                               TREATMENT DATE:  04/16/24 Therex Nu step seated eliptical X 15 min L3 UE/LE with  moist heat to low back.  Standing gastroc stretch 30 sec X 3 bilat off step Standing soleus stretch 30 sec X 3 bilat off step Standing lumbar extensions at wall X 10, holding 3 sec Standing L stretch 5 sec X 10   Theractivity (strength for ADL's) Eccentric heel raises off step up with both and slow lower with both into deep ROM 2X10 Leg press DL 59# 7K89 Leg extension machine 25# 2X10 DL Seated row machine 84# DL 7K89  Back extension machine 40# DL 7K89         PATIENT EDUCATION: Education details: HEP, PT plan of care,  selfcare Person educated: Patient Education method: Explanation, Demonstration, Verbal cues, and Handouts Education comprehension: verbalized understanding, further education recommended   HOME EXERCISE PROGRAM: Access Code: [email or web address] URL: [email or web address] Date: 03/24/2024 Prepared by: Redell Moose Foot/ankle program Exercises - Gastroc Stretch on Wall  - 1 x daily - 3 x weekly - 1 sets - 3 reps - 30 hold - Soleus Stretch on Wall  - 2 x daily - 6 x weekly - 3 sets - 30 hold - Standing Eccentric Heel Raise  - 2 x daily - 6 x weekly - 3 sets - 10 reps - Sit to Stand Without Arm Support  - 2 x daily - 6 x weekly - 2 sets - 5 reps - Tandem Stance  - 2 x daily - 6 x weekly - 1 sets - 2-3 reps - 30 sec hold - Standing Single Leg Stance with Counter Support  - 2 x daily - 6 x weekly - 1 sets - 3 reps - 20 sec hold  Lumbar program Access Code: [email or web address] URL: [email or web address] Date: 04/07/2024 Prepared by: Redell Moose  Exercises - Standing Lumbar Extension at Wall - Forearms  - 1-2 x daily - 6 x weekly - 1 sets - 10 reps - Slump Stretch  - 1-2 x daily - 6 x weekly - 1 sets - 10 reps (this was changed out for piriformis stretch) - Supine Lower Trunk Rotation  - 1-2 x daily - 6 x weekly - 1 sets - 5 reps - 10 sec hold - Supine Bridge  - 2 x daily - 6 x weekly - 1 sets - 15 reps - 5 hold  ASSESSMENT:  CLINICAL IMPRESSION:  She is doing well with foot/ankle pain but is still having some back pain. She has 2 more PT visits scheduled.   OBJECTIVE IMPAIRMENTS: decreased activity tolerance for ADL's, difficulty walking, decreased balance, decreased endurance, decreased mobility, decreased ROM, decreased strength, impaired flexibility, impaired ankle use, and pain.  ACTIVITY LIMITATIONS: bending, lifting, carry, locomotion, cleaning, community activity, driving.  PERSONAL FACTORS: see above PMH  also affecting patient's functional outcome.  REHAB POTENTIAL: Good  CLINICAL DECISION MAKING: Evolving/moderate complexity  EVALUATION COMPLEXITY: Moderate    GOALS: Short term PT Goals Target date: 04/21/2024   Pt will be I and compliant with HEP. Baseline:  Goal status: MET 04/07/24 Pt will decrease pain by 25% overall with walking 10 minutes Baseline: Goal status: MET 04/07/24  Long term PT goals Target date:05/05/2024   Pt will improve ankle DF ROM >7 deg to improve functional mobility Baseline: Goal status: MET 04/07/24 Pt will improve  ankle PF strength to at least 15 single leg calf raises improve functional strength for walking endurance Baseline: Goal status: ongoing 04/07/24 Pt will improve Patient specific functional scale (PSFS) to at least 7/10 to show improved function level Baseline: Goal status: ongoing 04/07/24 Pt will be able to ambulate community distances at least 500 ft WNL gait pattern without complaints Baseline: Goal status: ongoing 04/07/24  PLAN: PT FREQUENCY: 1-3 times per week   PT DURATION: 6 weeks  PLANNED INTERVENTIONS (unless contraindicated): aquatic PT, Canalith repositioning, cryotherapy, Electrical stimulation, Iontophoresis with 4 mg/ml dexamethasome, Moist heat, traction, Ultrasound, gait training, Therapeutic exercise, balance training, neuromuscular re-education, patient/family education, manual techniques, passive ROM, dry needling, taping, vasopnuematic device, spinal  manipulations, joint manipulations 97110-Therapeutic exercises, 97530- Therapeutic activity, 97112- Neuromuscular re-education, 97535- Self Care, and [id number]- Manual therapy  PLAN FOR NEXT SESSION:  foot/ankle stretching and strengthening, back strength  NEXT  MD VISIT: not scheduled at eval  Redell JONELLE Moose, PT,DPT 04/16/2024, 11:41 AM

## 2024-04-17 DIAGNOSIS — S134XXA Sprain of ligaments of cervical spine, initial encounter: Secondary | ICD-10-CM | POA: Diagnosis not present

## 2024-04-17 DIAGNOSIS — M47816 Spondylosis without myelopathy or radiculopathy, lumbar region: Secondary | ICD-10-CM | POA: Diagnosis not present

## 2024-04-17 DIAGNOSIS — M9903 Segmental and somatic dysfunction of lumbar region: Secondary | ICD-10-CM | POA: Diagnosis not present

## 2024-04-17 DIAGNOSIS — M9902 Segmental and somatic dysfunction of thoracic region: Secondary | ICD-10-CM | POA: Diagnosis not present

## 2024-04-17 DIAGNOSIS — M9901 Segmental and somatic dysfunction of cervical region: Secondary | ICD-10-CM | POA: Diagnosis not present

## 2024-04-17 DIAGNOSIS — S233XXA Sprain of ligaments of thoracic spine, initial encounter: Secondary | ICD-10-CM | POA: Diagnosis not present

## 2024-04-21 ENCOUNTER — Encounter: Payer: Self-pay | Admitting: Physical Therapy

## 2024-04-21 ENCOUNTER — Ambulatory Visit: Admitting: Physical Therapy

## 2024-04-21 ENCOUNTER — Telehealth: Payer: Self-pay

## 2024-04-21 DIAGNOSIS — Z85828 Personal history of other malignant neoplasm of skin: Secondary | ICD-10-CM | POA: Diagnosis not present

## 2024-04-21 DIAGNOSIS — M6281 Muscle weakness (generalized): Secondary | ICD-10-CM

## 2024-04-21 DIAGNOSIS — M25571 Pain in right ankle and joints of right foot: Secondary | ICD-10-CM

## 2024-04-21 DIAGNOSIS — M5459 Other low back pain: Secondary | ICD-10-CM

## 2024-04-21 DIAGNOSIS — L821 Other seborrheic keratosis: Secondary | ICD-10-CM | POA: Diagnosis not present

## 2024-04-21 DIAGNOSIS — L814 Other melanin hyperpigmentation: Secondary | ICD-10-CM | POA: Diagnosis not present

## 2024-04-21 DIAGNOSIS — R262 Difficulty in walking, not elsewhere classified: Secondary | ICD-10-CM

## 2024-04-21 DIAGNOSIS — L570 Actinic keratosis: Secondary | ICD-10-CM | POA: Diagnosis not present

## 2024-04-21 NOTE — Telephone Encounter (Signed)
 Copied from CRM [id number]. Topic: Appointments - Appointment Cancel/Reschedule >> Apr 21, 2024  8:18 AM Benton KIDD wrote: Patient/patient representative is calling to cancel or reschedule an appointment. Refer to attachments for appointment information. Patient wanted to reschedule her appointment with tammy parrett for a earlier appointment but nothing is available . Did add appointment to waitlist . Patient says she go out of town in September and this for cpap compliance . Please reach out to patient is anything is available sooner  [phone]  04/21/2024 Called PT no answer unable to leave VM. Called to inform the PT that she has been added to the wait list and as of now. There are no earlier appointments for the provider.

## 2024-04-21 NOTE — Therapy (Signed)
 OUTPATIENT PHYSICAL THERAPY TREATMENT   Patient Name: Marie Farmer MRN: [id number] DOB:01-Apr-1956, 68 y.o., female Today's Date: 04/21/2024  END OF SESSION:  PT End of Session - 04/21/24 1110     Visit Number 9    Number of Visits 12    Date for PT Re-Evaluation 05/05/24    Progress Note Due on Visit 10    PT Start Time 1105    PT Stop Time 1150    PT Time Calculation (min) 45 min    Activity Tolerance Patient tolerated treatment well    Behavior During Therapy WFL for tasks assessed/performed              Past Medical History:  Diagnosis Date   Anxiety    Asthma    DDD (degenerative disc disease)    chronic back pain   Fibromyalgia    Foot pain    GERD (gastroesophageal reflux disease)    Hypertension    IBS (irritable bowel syndrome)    Mood disorder (HCC)    PONV (postoperative nausea and vomiting)    Sleep apnea    Venous insufficiency    Wears glasses    Past Surgical History:  Procedure Laterality Date   CARPAL TUNNEL RELEASE Right 06/10/2013   Procedure: CARPAL TUNNEL RELEASE;  Surgeon: Arley JONELLE Curia, MD;  Location: [facility];  Service: Orthopedics;  Laterality: Right;   CHOLECYSTECTOMY N/A 08/19/2021   Procedure: LAPAROSCOPIC CHOLECYSTECTOMY;  Surgeon: Kallie Manuelita BROCKS, MD;  Location: [facility];  Service: General;  Laterality: N/A;   COLONOSCOPY  12/20/2011   [name]-single diminutive sessile polyp in the sigmoid colon/small interenal hemorrhoids   COLONOSCOPY N/A 11/28/2017   Procedure: COLONOSCOPY;  Surgeon: Shaaron Lamar HERO, MD;  Location: [facility];  Service: Endoscopy;  Laterality: N/A;  10:30   COLONOSCOPY WITH PROPOFOL  N/A 07/07/2021   one 9 mm polyp in rectum s/p removal (serrated adenoma). surveillance in 5 years   ESOPHAGOGASTRODUODENOSCOPY  04/10/2003   Normal esophagus/Antral erosions, as described above.  The remainder of the stomach appeared normal.  Normal first and second parts of the duodenum.    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   Other specified nontoxic goiter 12/12/2021   Anemia of chronic disease 08/02/2021   Ankle impingement syndrome, left 03/25/2020   Osteochondritis dissecans of talus, left 03/25/2020   Instability of left ankle joint 02/03/2020   Plantar fasciitis 04/18/2018   Aphthous ulcer of mouth 04/02/2018   Difficulty sleeping 04/02/2018   Under emotional stress 04/02/2018   OSA on CPAP 02/01/2018   Dyspnea on exertion 10/16/2016   Essential hypertension 10/16/2016   Morbid obesity due to excess calories (HCC) 10/16/2016   IBS (irritable bowel syndrome) 02/29/2012   Tibial plateau fracture 05/10/2011   Helicobacter pylori infection 08/25/2009   DEPRESSION 08/25/2009   Asthma 08/25/2009   GASTROESOPHAGEAL REFLUX DISEASE, CHRONIC 08/25/2009   Allergy  08/25/2009   Cholelithiasis 08/25/2009    PCP: Rosamond Leta NOVAK, MD  REFERRING PROVIDER:  McCaughan, Dia D, DPM   REFERRING DIAG:  M79.2 (ICD-10-CM) - Neuritis  M72.2 (ICD-10-CM) - Plantar fasciitis of right foot  M76.821 (ICD-10-CM) - Posterior tibial tendinitis, right    Rationale for Evaluation and Treatment:  Rehabiliation  THERAPY DIAG:  Pain in right ankle and joints of right foot  Muscle weakness (generalized)  Difficulty in walking, not elsewhere classified  Other low back pain  ONSET DATE: 1.5 year onset of pain but worse since  may 2025   SUBJECTIVE:                                                                                                                                                                                           SUBJECTIVE STATEMENT: Her feet are doing good, sometimes has back pain still but not there today.  PERTINENT HISTORY:  See above PMH  PAIN:  NPRS scale: no pain today Pain location:Rt foot at metatarsal head and heel.  Pain description: constant, achy, sharp Aggravating factors: driving, standing, walking Relieving factors: rest, meds   PRECAUTIONS: ,  None  RED FLAGS: None   WEIGHT BEARING RESTRICTIONS:  No  FALLS:  Has patient fallen in last 6 months? Yes. Number of falls 1, does feel she is losing some balance.    PLOF:  Independent with basic ADLs, does have to get help with lifting water , can't climb ladder.   PATIENT GOALS:  Reduce pain.   OBJECTIVE:  Note: Objective measures were completed at Evaluation unless otherwise noted.  DIAGNOSTIC FINDINGS:  -X-rays obtained reviewed the right foot.  3 views were obtained.  Hardware intact from prior surgery.  Minimal calcaneal spurring is present posteriorly.  X-ray changes present of the third MTPJ as well as along the midfoot.  Radiographic Exam (right foot 3 weightbearing views obtained on 03/11/2024 were reviewed):  Normal osseous mineralization.  Previous fusion of the first metatarsal-medial cuneiform joint with 2 screws in place.  Also a small screw in the head of the second metatarsal.  There is an enlarged first intermetatarsal angle.  There is a navicular-medial cuneiform fault noted on the lateral view.  Lumbar MRI IMPRESSION: Small spondylitic disc herniations at L3-L4, L4-L5 and L5-S1 as described above.  PATIENT SURVEYS:  Patient-Specific Activity Scoring Scheme  0 represents "unable to perform." 10 represents "able to perform at prior level. 0 1 2 3 4 5 6 7 8 9  10 (Date and  Score)   Activity Eval     1. walking  1    2. Driving for one hour  1    3.     4.  5.    Score 1/10    Total score = sum of the activity scores/number of activities Minimum detectable change (90%CI) for average score = 2 points Minimum detectable change (90%CI) for single activity score = 3 points     EDEMA:  Yes: moderate swelling in feet/ankles   GAIT: Distance walked: limited communitiy distances due to pain Assistive device utilized: None Level of assistance: Complete Independence Comments: increased foot ER due to tight heel cords  Ankle   LOWER EXTREMITY MMT    Strength MMT Right eval Left eval Rt/Lt 04/07/24 Rt/Lt 04/14/24  Hip flexion      Hip extension      Hip abduction      Hip adduction      Hip internal rotation      Hip external rotation      Knee flexion 4+ 4+ 5/5   Knee extension 4+ 4+ 5/5   Ankle dorsiflexion 4+ 4+ 5/5   Ankle plantarflexion 4 11 single leg calf raises 4 8 single leg calf raises 5/5 5/5 25 single leg calf raises/25 single leg calf raises  Ankle inversion 4+ 4+ 5/55/5   Ankle eversion 4+ 4+     (Blank rows = not tested)   LOWER EXTREMITY ROM  AROM Right eval Left eval Rt/Lt 04/07/24  Hip flexion     Hip extension     Hip abduction     Hip adduction     Hip internal rotation     Hip external rotation     Knee flexion     Knee extension     Ankle dorsiflexion 5 3 8/7  Ankle plantarflexion WNL WNL   Ankle inversion WNL WNL   Ankle eversion 15 15 23/20   (Blank rows = not tested)   FUNCTIONAL TESTS:  Eval: SLS 20 second avg, Tandem stance 30 second average, increased sway noted  04/14/24 SLS 20 second avg, Tandem stance 30 second average, increased sway noted                                                                                                                               TREATMENT DATE:  04/16/24 Therex Recumbent bike L1 X 10 min True X ride seated eliptical X 5 min L3 UE/LE   Standing  gastroc stretch 30 sec X 3 bilat off step Standing soleus stretch 30 sec X 3 bilat off step   Theractivity (strength for ADL's) Eccentric heel raises off step up with both and slow lower with both into deep ROM 2X10 Leg press DL 59# 7K89 Leg extension machine 25# 2X10 DL Seated row machine 74# DL 7K89  Back extension machine 40# DL 7K89 Tandem balance 30 sec X 2 bilat on foam pad SLS 15 sec X 2 on foam pad         PATIENT EDUCATION: Education details: HEP, PT plan of care, selfcare Person educated: Patient Education method: Explanation, Demonstration, Verbal cues,  and Handouts Education comprehension: verbalized understanding, further education recommended   HOME EXERCISE PROGRAM: Access Code: [email or web address] URL: [email or web address] Date: 03/24/2024 Prepared by: Redell Moose Foot/ankle program Exercises - Gastroc Stretch on Wall  - 1 x daily - 3 x weekly - 1 sets - 3 reps - 30 hold - Soleus Stretch on Wall  - 2 x daily - 6 x weekly - 3 sets - 30 hold - Standing Eccentric Heel Raise  - 2 x daily - 6 x weekly - 3 sets - 10 reps - Sit to Stand Without Arm Support  - 2 x daily - 6 x weekly - 2 sets - 5 reps - Tandem Stance  - 2 x daily - 6 x weekly - 1 sets - 2-3 reps - 30 sec hold - Standing Single Leg Stance with Counter Support  - 2 x daily - 6 x weekly - 1 sets - 3 reps - 20 sec hold  Lumbar program Access Code: [email or web address] URL: [email or web address] Date: 04/07/2024 Prepared by: Redell Moose  Exercises - Standing Lumbar Extension at Wall - Forearms  - 1-2 x daily - 6 x weekly - 1 sets - 10 reps - Slump Stretch  - 1-2 x daily - 6 x weekly - 1 sets - 10 reps (this was changed out for piriformis stretch) - Supine Lower Trunk Rotation  - 1-2 x daily - 6 x weekly - 1 sets - 5 reps - 10 sec hold - Supine Bridge  - 2 x daily - 6 x weekly - 1 sets - 15 reps - 5 hold  ASSESSMENT:  CLINICAL IMPRESSION: She has done well with PT, she has one more  visit scheduled and will plan to transition to independent gym program after that.   OBJECTIVE IMPAIRMENTS: decreased activity tolerance for ADL's, difficulty walking, decreased balance, decreased endurance, decreased mobility, decreased ROM, decreased strength, impaired flexibility, impaired ankle use, and pain.  ACTIVITY LIMITATIONS: bending, lifting, carry, locomotion, cleaning, community activity, driving.  PERSONAL FACTORS: see above PMH  also affecting patient's functional outcome.  REHAB POTENTIAL: Good  CLINICAL DECISION MAKING: Evolving/moderate complexity  EVALUATION COMPLEXITY: Moderate    GOALS: Short term PT Goals Target date: 04/21/2024   Pt will be I and compliant with HEP. Baseline:  Goal status: MET 04/07/24 Pt will decrease pain by 25% overall with walking 10 minutes Baseline: Goal status: MET 04/07/24  Long term PT goals Target date:05/05/2024   Pt will improve ankle DF ROM >7 deg to improve functional mobility Baseline: Goal status: MET 04/07/24 Pt will improve  ankle PF strength to at least 15 single leg calf raises improve functional strength for walking endurance Baseline: Goal status: ongoing 04/07/24 Pt will improve Patient specific functional scale (PSFS) to at least 7/10 to show improved function level Baseline: Goal status: ongoing 04/07/24 Pt will be able to ambulate community distances at least 500 ft WNL gait pattern without complaints Baseline: Goal status: ongoing 04/07/24  PLAN: PT FREQUENCY: 1-3 times per week   PT DURATION: 6 weeks  PLANNED INTERVENTIONS (unless contraindicated): aquatic PT, Canalith repositioning, cryotherapy, Electrical stimulation, Iontophoresis with 4 mg/ml dexamethasome, Moist heat, traction, Ultrasound, gait training, Therapeutic exercise, balance training, neuromuscular re-education, patient/family education, manual techniques, passive ROM, dry needling, taping, vasopnuematic device, spinal manipulations, joint  manipulations 97110-Therapeutic exercises, 97530- Therapeutic activity, 97112- Neuromuscular re-education, 97535- Self Care, and 02859- Manual therapy  PLAN FOR NEXT SESSION:  foot/ankle stretching and strengthening, back strength  NEXT MD VISIT: update measurments and DC next visit  Redell JONELLE Moose, PT,DPT 04/21/2024, 11:37 AM

## 2024-04-22 DIAGNOSIS — M9901 Segmental and somatic dysfunction of cervical region: Secondary | ICD-10-CM | POA: Diagnosis not present

## 2024-04-22 DIAGNOSIS — M9902 Segmental and somatic dysfunction of thoracic region: Secondary | ICD-10-CM | POA: Diagnosis not present

## 2024-04-22 DIAGNOSIS — M47816 Spondylosis without myelopathy or radiculopathy, lumbar region: Secondary | ICD-10-CM | POA: Diagnosis not present

## 2024-04-22 DIAGNOSIS — M9903 Segmental and somatic dysfunction of lumbar region: Secondary | ICD-10-CM | POA: Diagnosis not present

## 2024-04-22 DIAGNOSIS — S134XXA Sprain of ligaments of cervical spine, initial encounter: Secondary | ICD-10-CM | POA: Diagnosis not present

## 2024-04-22 DIAGNOSIS — S233XXA Sprain of ligaments of thoracic spine, initial encounter: Secondary | ICD-10-CM | POA: Diagnosis not present

## 2024-04-23 ENCOUNTER — Ambulatory Visit: Admitting: Physical Therapy

## 2024-04-23 ENCOUNTER — Encounter: Payer: Self-pay | Admitting: Physical Therapy

## 2024-04-23 DIAGNOSIS — M5459 Other low back pain: Secondary | ICD-10-CM

## 2024-04-23 DIAGNOSIS — M25571 Pain in right ankle and joints of right foot: Secondary | ICD-10-CM

## 2024-04-23 DIAGNOSIS — R262 Difficulty in walking, not elsewhere classified: Secondary | ICD-10-CM

## 2024-04-23 DIAGNOSIS — M6281 Muscle weakness (generalized): Secondary | ICD-10-CM

## 2024-04-23 NOTE — Therapy (Signed)
 OUTPATIENT PHYSICAL THERAPY TREATMENT   Patient Name: Marie Farmer MRN: [id number] DOB:Sep 11, 1956, 68 y.o., female Today's Date: 04/23/2024  END OF SESSION:  PT End of Session - 04/23/24 1103     Visit Number 10    Number of Visits 12    Date for PT Re-Evaluation 05/05/24    Progress Note Due on Visit 10    PT Start Time 1100    PT Stop Time 1145    PT Time Calculation (min) 45 min    Activity Tolerance Patient tolerated treatment well    Behavior During Therapy WFL for tasks assessed/performed              Past Medical History:  Diagnosis Date   Anxiety    Asthma    DDD (degenerative disc disease)    chronic back pain   Fibromyalgia    Foot pain    GERD (gastroesophageal reflux disease)    Hypertension    IBS (irritable bowel syndrome)    Mood disorder (HCC)    PONV (postoperative nausea and vomiting)    Sleep apnea    Venous insufficiency    Wears glasses    Past Surgical History:  Procedure Laterality Date   CARPAL TUNNEL RELEASE Right 06/10/2013   Procedure: CARPAL TUNNEL RELEASE;  Surgeon: Arley JONELLE Curia, MD;  Location: [facility];  Service: Orthopedics;  Laterality: Right;   CHOLECYSTECTOMY N/A 08/19/2021   Procedure: LAPAROSCOPIC CHOLECYSTECTOMY;  Surgeon: Kallie Manuelita BROCKS, MD;  Location: [facility];  Service: General;  Laterality: N/A;   COLONOSCOPY  12/20/2011   [name]-single diminutive sessile polyp in the sigmoid colon/small interenal hemorrhoids   COLONOSCOPY N/A 11/28/2017   Procedure: COLONOSCOPY;  Surgeon: Shaaron Lamar HERO, MD;  Location: [facility];  Service: Endoscopy;  Laterality: N/A;  10:30   COLONOSCOPY WITH PROPOFOL  N/A 07/07/2021   one 9 mm polyp in rectum s/p removal (serrated adenoma). surveillance in 5 years   ESOPHAGOGASTRODUODENOSCOPY  04/10/2003   Normal esophagus/Antral erosions, as described above.  The remainder of the stomach appeared normal.  Normal first and second parts of the duodenum.    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   Other specified nontoxic goiter 12/12/2021   Anemia of chronic disease 08/02/2021   Ankle impingement syndrome, left 03/25/2020   Osteochondritis dissecans of talus, left 03/25/2020   Instability of left ankle joint 02/03/2020   Plantar fasciitis 04/18/2018   Aphthous ulcer of mouth 04/02/2018   Difficulty sleeping 04/02/2018   Under emotional stress 04/02/2018   OSA on CPAP 02/01/2018   Dyspnea on exertion 10/16/2016   Essential hypertension 10/16/2016   Morbid obesity due to excess calories (HCC) 10/16/2016   IBS (irritable bowel syndrome) 02/29/2012   Tibial plateau fracture 05/10/2011   Helicobacter pylori infection 08/25/2009   DEPRESSION 08/25/2009   Asthma 08/25/2009   GASTROESOPHAGEAL REFLUX DISEASE, CHRONIC 08/25/2009   Allergy  08/25/2009   Cholelithiasis 08/25/2009    PCP: Rosamond Leta NOVAK, MD  REFERRING PROVIDER:  McCaughan, Dia D, DPM   REFERRING DIAG:  M79.2 (ICD-10-CM) - Neuritis  M72.2 (ICD-10-CM) - Plantar fasciitis of right foot  M76.821 (ICD-10-CM) - Posterior tibial tendinitis, right    Rationale for Evaluation and Treatment:  Rehabiliation  THERAPY DIAG:  Pain in right ankle and joints of right foot  Muscle weakness (generalized)  Difficulty in walking, not elsewhere classified  Other low back pain  ONSET DATE: 1.5 year onset of pain but worse since  may 2025   SUBJECTIVE:                                                                                                                                                                                           SUBJECTIVE STATEMENT: Her feet are doing good, feels ready to discharge from PT, does still have back pain reports seeing chiropractor for this.  PERTINENT HISTORY:  See above PMH  PAIN:  NPRS scale: no pain today Pain location:Rt foot at metatarsal head and heel.  Pain description: constant, achy, sharp Aggravating factors: driving, standing, walking Relieving factors: rest, meds   PRECAUTIONS: ,  None  RED FLAGS: None   WEIGHT BEARING RESTRICTIONS:  No  FALLS:  Has patient fallen in last 6 months? Yes. Number of falls 1, does feel she is losing some balance.    PLOF:  Independent with basic ADLs, does have to get help with lifting water , can't climb ladder.   PATIENT GOALS:  Reduce pain.   OBJECTIVE:  Note: Objective measures were completed at Evaluation unless otherwise noted.  DIAGNOSTIC FINDINGS:  -X-rays obtained reviewed the right foot.  3 views were obtained.  Hardware intact from prior surgery.  Minimal calcaneal spurring is present posteriorly.  X-ray changes present of the third MTPJ as well as along the midfoot.  Radiographic Exam (right foot 3 weightbearing views obtained on 03/11/2024 were reviewed):  Normal osseous mineralization.  Previous fusion of the first metatarsal-medial cuneiform joint with 2 screws in place.  Also a small screw in the head of the second metatarsal.  There is an enlarged first intermetatarsal angle.  There is a navicular-medial cuneiform fault noted on the lateral view.  Lumbar MRI IMPRESSION: Small spondylitic disc herniations at L3-L4, L4-L5 and L5-S1 as described above.  PATIENT SURVEYS:  Patient-Specific Activity Scoring Scheme  0 represents "unable to perform." 10 represents "able to perform at prior level. 0 1 2  3 4 5 6 7 8 9 10  (Date and Score)   Activity Eval   04/23/24  1. walking  1 5   2. Driving for one hour  1 5   3.  4.    5.    Score 1/10 5/10   Total score = sum of the activity scores/number of activities Minimum detectable change (90%CI) for average score = 2 points Minimum detectable change (90%CI) for single activity score = 3 points     EDEMA:  Yes: moderate swelling in feet/ankles   GAIT: Distance walked: limited communitiy distances due to pain Assistive device utilized: None Level of assistance: Complete Independence Comments: increased foot ER due to tight heel cords  Ankle   LOWER EXTREMITY MMT    Strength MMT Right eval Left eval Rt/Lt 04/07/24 Rt/Lt 04/14/24  Hip flexion      Hip extension      Hip abduction      Hip adduction      Hip internal rotation      Hip external rotation      Knee flexion 4+ 4+ 5/5   Knee extension 4+ 4+ 5/5   Ankle dorsiflexion 4+ 4+ 5/5   Ankle plantarflexion 4 11 single leg calf raises 4 8 single leg calf raises 5/5 5/5 25 single leg calf raises/25 single leg calf raises  Ankle inversion 4+ 4+ 5/55/5   Ankle eversion 4+ 4+     (Blank rows = not tested)   LOWER EXTREMITY ROM  AROM Right eval Left eval Rt/Lt 04/07/24  Hip flexion     Hip extension     Hip abduction     Hip adduction     Hip internal rotation     Hip external rotation     Knee flexion     Knee extension     Ankle dorsiflexion 5 3 8/7  Ankle plantarflexion WNL WNL   Ankle inversion WNL WNL   Ankle eversion 15 15 23/20   (Blank rows = not tested)   FUNCTIONAL TESTS:  Eval: SLS 20 second avg, Tandem stance 30 second average, increased sway noted  04/14/24 SLS 20 second avg, Tandem stance 30 second average, increased sway noted  04/23/24: SLS 30 second avg, tandem balance 40 sec avg                                                                                                                               TREATMENT DATE:   04/23/24 Therex Recumbent bike L1 X 15 min Standing gastroc stretch 30 sec X 3 bilat off step Standing soleus stretch 30 sec X 3 bilat off step   Theractivity (strength for ADL's) Eccentric heel raises off step up with both and slow lower with both into deep ROM 2X10 Leg press DL 59# 7K89 Leg extension machine 25# 2X10 DL Hamstring curl machine 25# 2X10 DL Seated row machine 74# DL 7K89  Back extension machine 40# DL 7K89          PATIENT EDUCATION: Education details: HEP, PT plan of care, selfcare Person educated: Patient Education method: Explanation, Demonstration, Verbal cues, and Handouts Education comprehension: verbalized understanding, further education  recommended   HOME EXERCISE PROGRAM: Access Code: [email or web address] URL: [email or web address] Date: 03/24/2024 Prepared by: Redell Moose Foot/ankle program Exercises - Gastroc Stretch on Wall  - 1 x daily - 3 x weekly - 1 sets - 3 reps - 30 hold - Soleus Stretch on Wall  - 2 x daily - 6 x weekly - 3 sets - 30 hold - Standing Eccentric Heel Raise  - 2 x daily - 6 x weekly - 3 sets - 10 reps - Sit to Stand Without Arm Support  - 2 x daily - 6 x weekly - 2 sets - 5 reps - Tandem Stance  - 2 x daily - 6 x weekly - 1 sets - 2-3 reps - 30 sec hold - Standing Single Leg Stance with Counter Support  - 2 x daily - 6 x weekly - 1 sets - 3 reps - 20 sec hold  Lumbar program Access Code: [email or web address] URL: [email or web address] Date: 04/07/2024 Prepared by: Redell Moose  Exercises - Standing Lumbar Extension at Wall - Forearms  - 1-2 x daily - 6 x weekly - 1 sets - 10 reps - Slump Stretch  - 1-2 x daily - 6 x weekly - 1 sets - 10 reps (this was changed out for piriformis stretch) - Supine Lower Trunk Rotation  - 1-2 x daily - 6 x weekly - 1 sets - 5 reps - 10 sec hold - Supine Bridge  - 2 x daily - 6 x weekly - 1 sets - 15 reps - 5 hold  ASSESSMENT:  CLINICAL IMPRESSION: She has met PT goals and  feels ready to discharge today form PT and plans to join our gym to continue with her exercises. Gym plan again reviewed with her and she shows good understanding.   OBJECTIVE IMPAIRMENTS: decreased activity tolerance for ADL's, difficulty walking, decreased balance, decreased endurance, decreased mobility, decreased ROM, decreased strength, impaired flexibility, impaired ankle use, and pain.  ACTIVITY LIMITATIONS: bending, lifting, carry, locomotion, cleaning, community activity, driving.  PERSONAL FACTORS: see above PMH  also affecting patient's functional outcome.  REHAB POTENTIAL: Good  CLINICAL DECISION MAKING: Evolving/moderate complexity  EVALUATION COMPLEXITY: Moderate    GOALS: Short term PT Goals Target date: 04/21/2024   Pt will be I and compliant with HEP. Baseline:  Goal status: MET 04/07/24 Pt will decrease pain by 25% overall with walking 10 minutes Baseline: Goal status: MET 04/07/24  Long term PT goals Target date:05/05/2024   Pt will improve ankle DF ROM >7 deg to improve functional mobility Baseline: Goal status: MET 04/07/24 Pt will improve  ankle PF strength to at least 15 single leg calf raises improve functional strength for walking endurance Baseline: Goal status: MET 04/14/24 Pt will improve Patient specific functional scale (PSFS) to at least 7/10 to show improved function level Baseline: Goal status: not met, improved to 5/10 on 04/23/24 Pt will be able to ambulate community distances at least 500 ft WNL gait pattern without complaints Baseline: Goal status: MET 04/23/24  PLAN: PT FREQUENCY: 1-3 times per week   PT DURATION: 6 weeks  PLANNED INTERVENTIONS (unless contraindicated): aquatic PT, Canalith repositioning, cryotherapy, Electrical stimulation, Iontophoresis with 4 mg/ml dexamethasome, Moist heat, traction, Ultrasound, gait training, Therapeutic exercise, balance training, neuromuscular re-education, patient/family education, manual  techniques, passive ROM, dry needling, taping, vasopnuematic device, spinal manipulations, joint manipulations 97110-Therapeutic exercises, 97530- Therapeutic activity, [id number]- Neuromuscular re-education, 97535- Self Care, and 02859- Manual therapy  PLAN FOR NEXT SESSION:  DC today  Redell JONELLE Moose, PT,DPT  04/23/2024, 11:48 AM

## 2024-04-24 DIAGNOSIS — S134XXA Sprain of ligaments of cervical spine, initial encounter: Secondary | ICD-10-CM | POA: Diagnosis not present

## 2024-04-24 DIAGNOSIS — M9902 Segmental and somatic dysfunction of thoracic region: Secondary | ICD-10-CM | POA: Diagnosis not present

## 2024-04-24 DIAGNOSIS — M9901 Segmental and somatic dysfunction of cervical region: Secondary | ICD-10-CM | POA: Diagnosis not present

## 2024-04-24 DIAGNOSIS — M9903 Segmental and somatic dysfunction of lumbar region: Secondary | ICD-10-CM | POA: Diagnosis not present

## 2024-04-24 DIAGNOSIS — S233XXA Sprain of ligaments of thoracic spine, initial encounter: Secondary | ICD-10-CM | POA: Diagnosis not present

## 2024-04-24 DIAGNOSIS — M47816 Spondylosis without myelopathy or radiculopathy, lumbar region: Secondary | ICD-10-CM | POA: Diagnosis not present

## 2024-04-30 DIAGNOSIS — M47816 Spondylosis without myelopathy or radiculopathy, lumbar region: Secondary | ICD-10-CM | POA: Diagnosis not present

## 2024-04-30 DIAGNOSIS — M9902 Segmental and somatic dysfunction of thoracic region: Secondary | ICD-10-CM | POA: Diagnosis not present

## 2024-04-30 DIAGNOSIS — M9901 Segmental and somatic dysfunction of cervical region: Secondary | ICD-10-CM | POA: Diagnosis not present

## 2024-04-30 DIAGNOSIS — M9903 Segmental and somatic dysfunction of lumbar region: Secondary | ICD-10-CM | POA: Diagnosis not present

## 2024-04-30 DIAGNOSIS — S233XXA Sprain of ligaments of thoracic spine, initial encounter: Secondary | ICD-10-CM | POA: Diagnosis not present

## 2024-04-30 DIAGNOSIS — S134XXA Sprain of ligaments of cervical spine, initial encounter: Secondary | ICD-10-CM | POA: Diagnosis not present

## 2024-05-04 DIAGNOSIS — F341 Dysthymic disorder: Secondary | ICD-10-CM | POA: Diagnosis not present

## 2024-05-04 DIAGNOSIS — G4733 Obstructive sleep apnea (adult) (pediatric): Secondary | ICD-10-CM | POA: Diagnosis not present

## 2024-05-23 ENCOUNTER — Telehealth (INDEPENDENT_AMBULATORY_CARE_PROVIDER_SITE_OTHER): Admitting: Adult Health

## 2024-05-23 ENCOUNTER — Encounter: Payer: Self-pay | Admitting: Adult Health

## 2024-05-23 DIAGNOSIS — G4733 Obstructive sleep apnea (adult) (pediatric): Secondary | ICD-10-CM

## 2024-05-23 DIAGNOSIS — Z79899 Other long term (current) drug therapy: Secondary | ICD-10-CM | POA: Diagnosis not present

## 2024-05-23 DIAGNOSIS — E7800 Pure hypercholesterolemia, unspecified: Secondary | ICD-10-CM | POA: Diagnosis not present

## 2024-05-23 DIAGNOSIS — J309 Allergic rhinitis, unspecified: Secondary | ICD-10-CM

## 2024-05-23 DIAGNOSIS — E559 Vitamin D deficiency, unspecified: Secondary | ICD-10-CM | POA: Diagnosis not present

## 2024-05-23 DIAGNOSIS — Z299 Encounter for prophylactic measures, unspecified: Secondary | ICD-10-CM | POA: Diagnosis not present

## 2024-05-23 DIAGNOSIS — E039 Hypothyroidism, unspecified: Secondary | ICD-10-CM | POA: Diagnosis not present

## 2024-05-23 DIAGNOSIS — R5383 Other fatigue: Secondary | ICD-10-CM | POA: Diagnosis not present

## 2024-05-23 DIAGNOSIS — Z0000 Encounter for general adult medical examination without abnormal findings: Secondary | ICD-10-CM | POA: Diagnosis not present

## 2024-05-23 DIAGNOSIS — I10 Essential (primary) hypertension: Secondary | ICD-10-CM | POA: Diagnosis not present

## 2024-05-23 DIAGNOSIS — Z1331 Encounter for screening for depression: Secondary | ICD-10-CM | POA: Diagnosis not present

## 2024-05-23 DIAGNOSIS — R52 Pain, unspecified: Secondary | ICD-10-CM | POA: Diagnosis not present

## 2024-05-23 DIAGNOSIS — Z1339 Encounter for screening examination for other mental health and behavioral disorders: Secondary | ICD-10-CM | POA: Diagnosis not present

## 2024-05-23 DIAGNOSIS — J4520 Mild intermittent asthma, uncomplicated: Secondary | ICD-10-CM

## 2024-05-23 DIAGNOSIS — Z7189 Other specified counseling: Secondary | ICD-10-CM | POA: Diagnosis not present

## 2024-05-23 DIAGNOSIS — Z6836 Body mass index (BMI) 36.0-36.9, adult: Secondary | ICD-10-CM | POA: Diagnosis not present

## 2024-05-23 NOTE — Progress Notes (Signed)
 Virtual Visit via Video Note  I connected with Marie Farmer on 05/23/24 at  3:00 PM EDT by a video enabled telemedicine application and verified that I am speaking with the correct person using two identifiers.  Location: Patient: Home  Provider: Office    I discussed the limitations of evaluation and management by telemedicine and the availability of in person appointments. The patient expressed understanding and agreed to proceed.  History of Present Illness: 68 yo female minimal smoking history seen for consult Jan 2025 for daytime sleepiness and pulmonary hypertension found to have moderate OSA and Mild intermittent Asthma and Allergic rhinitis  Medical history significant for rheumatoid arthritis, chronic pain (neck and back)   Today's video visit is a 2 month follow up for OSA and Asthma and Allergies  Discussed the use of AI scribe software for clinical note transcription with the patient, who gave verbal consent to proceed.  History of Present Illness Marie Farmer is a 69 year old female with sleep apnea who presents for a two-month follow-up on CPAP therapy.  She was diagnosed with moderate sleep apnea, Since starting CPAP therapy,  She feels much clearer-headed and notes a significant improvement in her symptoms. She uses a full face mask with her CPAP machine and uses distilled water  in the water  chamber. She experiences nasal dryness and has been using Vaseline for relief. CPAP download shows excellent 100% compliance.  Daily average usage at 7 hours.  She is using a fullface mask.  She is on CPAP 14 cm H2O.  AHI 3.3/hour. Patient has mild intermittent asthma and allergies.  Says overall breathing is doing okay.  No flare of cough or wheezing She also experiences some allergies, which she manages with Zyrtec and Singulair (montelukast) taken at night.  Past Medical History:  Diagnosis Date   Anxiety    Asthma    DDD (degenerative disc disease)    chronic back pain    Fibromyalgia    Foot pain    GERD (gastroesophageal reflux disease)    Hypertension    IBS (irritable bowel syndrome)    Mood disorder (HCC)    PONV (postoperative nausea and vomiting)    Sleep apnea    Venous insufficiency    Wears glasses    Current Outpatient Medications on File Prior to Visit  Medication Sig Dispense Refill   acetaminophen  (TYLENOL ) 500 MG tablet Take 1,000 mg by mouth every 6 (six) hours as needed for moderate pain.     albuterol  (VENTOLIN  HFA) 108 (90 Base) MCG/ACT inhaler Inhale 2 puffs into the lungs every 6 (six) hours as needed for wheezing or shortness of breath.     Biotin 1000 MCG CHEW Chew 1 tablet by mouth daily.     ergocalciferol  (VITAMIN D2) 1.25 MG (50000 UT) capsule Take 1 capsule (50,000 Units total) by mouth once a week. 12 capsule 4   Flaxseed, Linseed, (FLAXSEED OIL) 1000 MG CAPS Take 1 capsule by mouth daily.     FLUoxetine (PROZAC) 20 MG capsule Take 20 mg by mouth daily.     fluticasone  (FLONASE ) 50 MCG/ACT nasal spray place 2 sprays into both nostrils daily. 1 g 3   gabapentin  (NEURONTIN ) 600 MG tablet Take 1 tablet (600 mg total) by mouth 3 (three) times daily. 360 tablet 2   levothyroxine  (SYNTHROID ) 75 MCG tablet Take 1 tablet (75 mcg total) by mouth every morning on an empty stomach. 90 tablet 5   Magnesium 400 MG CAPS Take 2 tablets  by mouth daily.     methylPREDNISolone  (MEDROL  DOSEPAK) 4 MG TBPK tablet Take as directed 21 tablet 0   montelukast (SINGULAIR) 10 MG tablet Take 10 mg by mouth daily.     Omega 3 1000 MG CAPS Take 1 capsule by mouth daily.     omeprazole  (PRILOSEC) 20 MG capsule Take 1 capsule (20 mg total) by mouth 2 (two) times daily. (Patient taking differently: Take 20 mg by mouth daily.) 180 capsule 4   sulfaSALAzine  (AZULFIDINE ) 500 MG tablet Take 3 tablets (1,500 mg total) by mouth 2 (two) times daily. 540 tablet 4   tiZANidine  (ZANAFLEX ) 4 MG capsule Take 4 mg by mouth 3 (three) times daily.     valACYclovir (VALTREX)  500 MG tablet Take 500 mg by mouth daily as needed.     valsartan -hydrochlorothiazide  (DIOVAN -HCT) 160-25 MG tablet Take 1 tablet by mouth daily. 90 tablet 4   Vitamin D , Ergocalciferol , (DRISDOL ) 1.25 MG (50000 UNIT) CAPS capsule Take 1 capsule (50,000 Units total) by mouth once a week. 12 capsule 0   No current facility-administered medications on file prior to visit.       Observations/Objective: 05/23/2024 -appears well in no acute distress, no increased work of breathing    split-night sleep study that was done on March 05, 2024 that showed moderate sleep apnea with AHI at 17.6/hour, SpO2 low at 81%.  Titration portion showed optimal control at CPAP 14 cm H2O with a medium fullface mask.(0.8/hr)    pulmonary function testing that was completed on November 22, 2023 that showed normal lung function with no airflow obstruction or restriction and normal diffusing capacity.    2D echo showed EF at 58%, grade 1 diastolic dysfunction, normal right ventricular size, mild MR, mild to moderate TR and moderate pulmonary hypertension with RVSP at 41 mmHg.   High-resolution CT chest done on March 15, 2024 was negative for interstitial lung disease. Few scattered pulmonary micro nodules for example in right lower lobe left perifissural Scattered calcified pulmonary micro nodules suggestive of prior granulomatous disease for example right lower lobe No fibrotic changes, air trapping, emphysematous changes, architectural distortion or honeycombing.    Assessment and Plan: .Assessment and Plan Assessment & Plan Obstructive sleep apnea   Obstructive sleep apnea is well-controlled with CPAP therapy,She feels clearer and better overall, indicating effective management. Continue CPAP therapy with current settings, use saline gel in the nares to prevent dryness, and maintain CPAP equipment cleanliness.  Allergic rhinitis   Allergic rhinitis symptoms are present, likely exacerbated by the fall season. She is  taking Zyrtec and montelukast, with symptoms expected to improve over the next few weeks. Continue Zyrtec and montelukast and use saline gel in the nares to alleviate symptoms.  Mild intermittent asthma.  Albuterol  as needed  Nasal dryness-use saline nasal gel as needed  Plan  Patient Instructions  Continue on CPAP At bedtime, wear all night long for at least 6hr or more.  Use Saline nasal gel At bedtime   Work on healthy weight loss  Do not drive if sleepy  Use caution with sedating medications   Continue on Singulair 10mg  At bedtime .  Zyrtec 10mg  At bedtime  as needed  drainage/allergies  Albuterol  inhaler As needed    Follow up in 6 months and As needed           Follow Up Instructions:    I discussed the assessment and treatment plan with the patient. The patient was provided an opportunity to ask  questions and all were answered. The patient agreed with the plan and demonstrated an understanding of the instructions.   The patient was advised to call back or seek an in-person evaluation if the symptoms worsen or if the condition fails to improve as anticipated.  I provided 21  minutes of non-face-to-face time during this encounter.   Madelin Stank, NP

## 2024-05-23 NOTE — Patient Instructions (Signed)
 Continue on CPAP At bedtime, wear all night long for at least 6hr or more.  Use Saline nasal gel At bedtime   Work on healthy weight loss  Do not drive if sleepy  Use caution with sedating medications   Continue on Singulair 10mg  At bedtime .  Zyrtec 10mg  At bedtime  as needed  drainage/allergies  Albuterol  inhaler As needed    Follow up in 6 months and As needed

## 2024-06-04 DIAGNOSIS — F341 Dysthymic disorder: Secondary | ICD-10-CM | POA: Diagnosis not present

## 2024-06-04 DIAGNOSIS — G4733 Obstructive sleep apnea (adult) (pediatric): Secondary | ICD-10-CM | POA: Diagnosis not present

## 2024-06-05 ENCOUNTER — Ambulatory Visit: Admitting: Neurology

## 2024-06-05 ENCOUNTER — Encounter: Payer: Self-pay | Admitting: Neurology

## 2024-06-05 VITALS — BP 133/79 | HR 70 | Ht 63.0 in | Wt 207.0 lb

## 2024-06-05 DIAGNOSIS — Z818 Family history of other mental and behavioral disorders: Secondary | ICD-10-CM | POA: Diagnosis not present

## 2024-06-05 DIAGNOSIS — G3184 Mild cognitive impairment, so stated: Secondary | ICD-10-CM

## 2024-06-05 DIAGNOSIS — R480 Dyslexia and alexia: Secondary | ICD-10-CM

## 2024-06-05 NOTE — Progress Notes (Signed)
 Provider:  Dedra Gores, MD  Primary Care Physician:  Rosamond Leta NOVAK, MD [address] [place] [address]     Referring Provider: Rosan Jacquline NOVAK, Np [address] [place],  [place] [address]          Chief Complaint according to patient   Patient presents with:     New Patient (Initial Visit)      reports she she has some short term and long term memory concerns. She states she recently was diagnosed with OSA and started CPAP, memory has improved some since starting. She has trouble with remembering appts, medications and locations. Mother had dementia       HISTORY OF PRESENT ILLNESS:  Marie Farmer is a 68 y.o. female patient who is seen Ermalene Jacquline NOVAK, Np [address] [place],  [place] [address]'d referral on 06/05/2024  for an Evaluation of COGNITIVE CONCERNS . The following examples were provided:   Chief concern ( according to patient)  :   I have gotten lost on the way to familiar  place, I found myself wihtin 1 mile from my doctor's office- and couldn't figure out how to get there . I have forgotten to fill my pill box, just feeling my thoughts get derailed.  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  She added :  I have recently had leg cramps, severe pain in either leg, only once in both  the pain has ascended from ankle and calf to the buttocks or flank.  Her PCP has started her on magnesium  bid and finally on potassium - this helped.   She has been on a diuretic.  She used to be a Engineer, materials , working day shift.     I have the pleasure of seeing Marie Farmer on 06/05/24, a right-handed female patient who reports difficulties with  memory, orientation, and last with word finding. She reports she is l dyslexic. She texts but doesn't write e mail or letters.    Med History: There have been no traumatic brain injuries, prolonged surgeries under general anesthesia, radiation or chemotherapy,  substance  abuse, or mental illness.   ( Reviewed evidence  or documentation of :Rheumatoid arthritis  Autoimmune disorders,  Vitamin deficiency- Vit B ,  Anemia, GERD,  HERNIA, Cholecystectomy, Thyroid  disease, Substance Abuse, Mood disorders, Depression. Current medications as listed did not contain anticholinergic substances, narcotics, benzodiazepines or sedatives.  The patient has adequate hearing and vision ability.  No loss of smell.   Social history: Marie Farmer 's Family status is divorced,  and she lives in a private  household with 2 cats,  The daily routines are well established, meal times, variable bedtime ( 11 pm- 1 AM), set rise time: 7 AM.    Marie Farmer has adult children, 3 grandchildren in [place]. Support nearby provided by nobody.  Marie Farmer is reporting independence in the following activities of daily living: activities of daily living   The patient drives:  She goes shopping or orders items for daily needs, has access to fresh food in the home, and eats regular meals.  Meals are prepared by the patient or in the household, she doesn't like cooking-  cheese, eggs, fruits. Canned beans. The patient  does not eat out.  She goes to the Gym 3-4 times weekly.    Communication: able to use a  land line telephone, a mobile phone, or a computer. She struggles recently with the computer, can't remember her password.  He/She makes appointments by phone or online and keeps appointments.  Reports independence in bill paying, banking, and files paperwork such as for insurance or taxes. Dressing, toileting, bathing , feeding,   Ambulation : Fall: one time in summer, stumbled and fell , no injuries.  Syncope; none   ROM restriction:   Family history : Impaired cognitive function or dementia were affecting the following biological  family members:  Mother was affected , symptoms in her started at 58 and  she had sundowning, was a diabetic.     Social history:  grew up as on of  nine, Patient attended 5 years of education, GED, learnt to read as an adult. She grew up in [place], her mother never went to school, her father didn't finish high school but he could read. SABRA  She worked in Youth worker, [employer], and security-   , and reported  difficulties with learning.  Nicotine use:  none , second hand.   ETOH use ; none ,  Substance use otherwise:  Caffeine intake in form of Coffee( 1-2 a day) Soda( /) Tea ( /) no energy drinks.  Physical activity in form of gym and her garden , her chicken coop.   Hobbies and Social activities:GYM.    The referring physician  has kindly provided the following clinical history information and evolution of symptoms , imaging  and test results:    Epworth score 3/ 24 - the patient has CPAP is through  [facility]-    - , Pulmonary HTN   MMSE:   MOCA:   CT head:NA  MRI brain: NA     Review of Systems: Out of a complete 14 system review, the patient complains of only the following symptoms, and all other reviewed systems are negative.:  See above    Social History   Socioeconomic History   Marital status: Divorced    Spouse name: Not on file   Number of children: 2   Years of education: Not on file   Highest education level: Not on file  Occupational History   Occupation: unemployed  Tobacco Use   Smoking status: Former    Current packs/day: 0.00    Types: Cigarettes    Quit date: 12/11/1983    Years since quitting: 40.5   Smokeless tobacco: Never  Vaping Use   Vaping status: Never Used  Substance and Sexual Activity   Alcohol  use: Yes    Comment: socially, couple times per week   Drug use: No    Types: Marijuana    Comment: remote maijuana   Sexual activity: Never    Birth control/protection: None  Other Topics Concern   Not on file  Social History Narrative   Lives w/ sister & her husband & daughter   Social Drivers of Corporate investment banker Strain: Not on file  Food Insecurity: Not  on file  Transportation Needs: No Transportation Needs (11/21/2023)   Received from [facility]   PRAPARE - Transportation    Lack of Transportation (Medical): No    Lack of Transportation (Non-Medical): No  Physical Activity: Not on file  Stress: Not on file  Social Connections: Not on file    Family History  Problem Relation Age of Onset   Diabetes Mother    Coronary artery disease Mother    COPD Mother  Colon polyps Mother        age 63   Dementia Mother    Cancer Father    Lung cancer Father    Breast cancer Neg Hx     Past Medical History:  Diagnosis Date   Anxiety    Asthma    DDD (degenerative disc disease)    chronic back pain   Fibromyalgia    Foot pain    GERD (gastroesophageal reflux disease)    Hypertension    IBS (irritable bowel syndrome)    Mood disorder    PONV (postoperative nausea and vomiting)    Sleep apnea    Venous insufficiency    Wears glasses     Past Surgical History:  Procedure Laterality Date   CARPAL TUNNEL RELEASE Right 06/10/2013   Procedure: CARPAL TUNNEL RELEASE;  Surgeon: Arley JONELLE Curia, MD;  Location: [facility];  Service: Orthopedics;  Laterality: Right;   CHOLECYSTECTOMY N/A 08/19/2021   Procedure: LAPAROSCOPIC CHOLECYSTECTOMY;  Surgeon: Kallie Manuelita BROCKS, MD;  Location: [facility];  Service: General;  Laterality: N/A;   COLONOSCOPY  12/20/2011   [name]-single diminutive sessile polyp in the sigmoid colon/small interenal hemorrhoids   COLONOSCOPY N/A 11/28/2017   Procedure: COLONOSCOPY;  Surgeon: Shaaron Lamar HERO, MD;  [facility]: [facility];  Service: Endoscopy;  Laterality: N/A;  10:30   COLONOSCOPY WITH PROPOFOL  N/A 07/07/2021   one 9 mm polyp in rectum s/p removal (serrated adenoma). surveillance in 5 years   ESOPHAGOGASTRODUODENOSCOPY  04/10/2003   Normal esophagus/Antral erosions, as described above.  The remainder of the stomach appeared normal.  Normal first and second parts of the duodenum.    ESOPHAGOGASTRODUODENOSCOPY  12/20/2011   [name]-hiatus hernia/chronic gastritis/normal duodenumNEGATIVE H pylori, negative SB biopsy   POLYPECTOMY  07/07/2021   Procedure: POLYPECTOMY;  Surgeon: Shaaron Lamar HERO, MD;  [facility]: [facility];  Service: Endoscopy;;   TIBIA FRACTURE SURGERY  09/11/2010   right     Current Outpatient Medications on File Prior to Visit  Medication Sig Dispense Refill   Biotin 1000 MCG CHEW Chew 1 tablet by mouth daily.     ergocalciferol  (VITAMIN D2) 1.25 MG (50000 UT) capsule Take 1 capsule (50,000 Units total) by mouth once a week. 12 capsule 4   gabapentin  (NEURONTIN ) 600 MG tablet Take 1 tablet (600 mg total) by mouth 3 (three) times daily. 360 tablet 2   levothyroxine  (SYNTHROID ) 75 MCG tablet Take 1 tablet (75 mcg total) by mouth every morning on an empty stomach. 90 tablet 5   Magnesium 400 MG CAPS Take 2 tablets by mouth daily.     montelukast  (SINGULAIR ) 10 MG tablet Take 10 mg by mouth daily.     sulfaSALAzine  (AZULFIDINE ) 500 MG tablet Take 3 tablets (1,500 mg total) by mouth 2 (two) times daily. 540 tablet 4   tiZANidine  (ZANAFLEX ) 4 MG capsule Take 4 mg by mouth 3 (three) times daily.     valACYclovir  (VALTREX ) 500 MG tablet Take 500 mg by mouth daily as needed.     valsartan -hydrochlorothiazide  (DIOVAN -HCT) 320-25 MG tablet Take 1 tablet by mouth daily.     No current facility-administered medications on file prior to visit.    Allergies  Allergen Reactions   Ace Inhibitors Shortness Of Breath   Beta Adrenergic Blockers Shortness Of Breath   Adhesive [Tape]     Peels skin   Cymbalta [Duloxetine Hcl]     Gums red, mouth felt hout   Methylprednisolone   Redness all over   Prednisone Other (See Comments)    Redness all over   Azithromycin Rash   Latex Rash    Sensitive skin, causes redness    Nabumetone Rash and Other (See Comments)    lips swollen       DIAGNOSTIC DATA (LABS, IMAGING, TESTING) - I reviewed patient  records, labs, notes, testing and imaging myself where available.  Lab Results  Component Value Date   WBC 5.7 10/16/2016   HGB 12.7 10/16/2016   HCT 37.8 10/16/2016   MCV 85.3 10/16/2016   PLT 298.0 10/16/2016      Component Value Date/Time   NA 139 07/04/2021 1258   K 3.3 (L) 07/04/2021 1258   CL 104 07/04/2021 1258   CO2 29 07/04/2021 1258   GLUCOSE 65 (L) 07/04/2021 1258   BUN 11 07/04/2021 1258   CREATININE 0.63 07/04/2021 1258   CALCIUM 9.2 07/04/2021 1258   GFRNONAA >60 07/04/2021 1258   GFRAA 61 (L) 06/05/2013 1355   No results found for: CHOL, HDL, LDLCALC, LDLDIRECT, TRIG, CHOLHDL No results found for: YHAJ8R No results found for: VITAMINB12 Lab Results  Component Value Date   TSH 3.31 10/16/2016    PHYSICAL EXAM:  Tabular:  No data found.   Body mass index is 36.67 kg/m.   Wt Readings from Last 3 Encounters:  06/05/24 207 lb (93.9 kg)  03/20/24 201 lb (91.2 kg)  03/05/24 200 lb (90.7 kg)     Ht Readings from Last 3 Encounters:  06/05/24 5' 3 (1.6 m)  03/20/24 5' 3 (1.6 m)  03/05/24 5' 3 (1.6 m)      Cardiovascular:  Regular rate and cardiac rhythm by pulse,  without distended neck veins. Respiratory: no tachypnoea or wheezing. .  Skin:  Without evidence of ankle edema, rash, bruising  The patient's posture was  erect.   NEUROLOGIC EXAM: The patient was awake and alert, oriented to place and time.   Attention span & concentration ability appeared normal.  Speech was fluent, without dysarthria, dysphonia or aphasia, and of normal volume.     Cranial nerves:  There was no  loss of smell or taste reported  Pupils are round, equal in size and briskly reactive to light.  Funduscopic exam was deferred.  Extraocular movements in vertical and horizontal planes were intact and without nystagmus. (No Diplopia reported). Visual fields by finger perimetry are intact. Hearing was intact to soft voice.    Facial sensation intact (  fine touch).  Facial motor strength: Symmetric movement and tongue and uvula move midline.  Neck ROM: rotation, tilt and flexion /extension were observed,  shoulder shrug was symmetrical.    Motor exam:  Symmetric bulk, strength and ROM.   Muscle tone was  without cog- wheeling, and there was symmetric grip strength.   Sensory:  Fine touch and vibration were tested by tuning fork and intact.  Proprioception tested in the upper extremities was normal.   Coordination: The patient reported no problems with button closure and no changes to penmanship.   The Finger-to-nose maneuver was intact without evidence of ataxia, dysmetria or tremor.   Gait and station: Patient could rise unassisted from a seated position, without  bracing, and walked without assistive device.  Stance was of normal/ wide width. The patient turned with 3 steps. Toe and heel walk were deferred. No limp was noted.  Arm swing was preserved. The patient's gait posture was erect   Deep tendon reflexes: Upper extremities did  show symmetric DTRs. Lower extremity DTRs were symmetric and brisk.       ASSESSMENT  :      Dear Rosan Jacquline NOVAK, Np [address] [place],  [place] [address],   Thank you for entrusting me with your patient's care.   As you know, your patient  Marie Farmer is,a 68 y.o. female presented here on 06-05-2024 for an Evaluation of cognitive problems.  upon your request .  Her Montreal cognitive assessment test showed remarkable little impairment.  The patient was able to complete the visual-spatial and executive test part.  She could name 3 animals, she had some trouble with repeating digits backwards.  She also had trouble with the serial sevens but carried a mistake through she deducted 9 instead of 7 from 100.  The language part was intact she did generate only 7 words beginning with F.  Her short-term memory was 4 out of 5 points and she was fully oriented to place and date.  The test result, the  physical exam or interview and her given examples of cognitive function decline indicate that she has had trouble with several domains of memory it is not truly short-term memory loss that is affecting her the most it seems to be the inability to carry a fall through so to hold on and complete a multistep, ask this affects driving and the orientation when she got lost a mile from her target, this has happened when she plays sudoku and she forgets pin numbers or passwords but these would not be short-term memory functions as she has established those months or years ago.  There is a possible overlap with her learning disabilities, and I think it would be best to have to have her also evaluated by a neuropsychologist to just see if the processing speed is her main stumbling block and cognitive function.  In a patient who lives independent is very active physically capable and basically lives by herself by choice, I feel that her memory score may have never been better than today, She has test anxiety.  As well possible that Mrs. Breton would not have generated 14 words beginning with F data 10 years ago.    My Plan is to proceed with:  I will provide an MRI of the brain I will also order a battery of laboratory test to make sure she is not at this time deficient in essential vitamins, her electrolytes now are well-balanced, that she is no longer anemic etc. in addition I will order an ATN test which is the Alzheimer tangle and neurofibrillary test for blood. The ATN test shows as if the pathology of Alzheimer's disease is there often a decade before any symptoms arise.    1) GNA DEMENTIA PANEL  2) ATN 3) Late onset ALZHEIMER Genetic risk  4) EEG 5) MRI brain with and without , if not already done 6 ) Start Donepezil (unless patient has bradycardia , IBS or REM BD) at 5 mg for 90 days, then increase to 10 mg- if tolerated. 7) Referral to neuropsychology for cognitive testing. 8) Refer for vision and  audiology testing if applicable.   The plan is to identify cognitive domains affected by changes, to rule out brain lesions or vascular abnormalities, to obtain testing of  AD bio-markers, genetic markers and , if applicable , consolidate these findings by a PET scan.  Serial testing  by [facility] or MMSE test is needed for all patients that are currently scoring in the  subjective memory loss, MCI, or mild dementia category.   A referral for neuropsychological interview and testing battery  has been ordered.  Medication to help slow cognitive decline are available but may not be tolerated, these are available in oral and patch form and their use is not specific to a single form of neurodegenerative cognitive disorder.   Early stages and mild stages of AD may qualify for infusion therapy, based on MOCA/MMSE score and risk status for brain bleeding (AIDA) .   I plan to follow up  through our NP or personally within 6 months.    The patient's condition requires frequent monitoring and adjustments in the treatment plan, reflecting the ongoing complexity of care.  This provider is for the named interval time the continuing focal point for associated medical /neurological needs services for this condition.   A total time of  60  minutes consistent of a part of face to face encounter , exam and interview,  and additional preparation time for chart review was spent. Additionally, the following were reviewed: Past medical records, past medical and surgical history, family and social background, as well as relevant laboratory results, imaging findings, and medical notes, where applicable.  At today's visit, we discussed treatment options, associated risk and benefits, and engage in counseling as needed: Including, but not limited to driving safety, home safety, the benefit of routines and activities.   Memory strategies were provided in the patient education attachment.   This note was generated in part by  using dictation software, and as a result, it may contain unintentional typos and errors.  Nevertheless, effort was made to accurately convey the pertinent aspects of the patient's visit.    Electronically signed by: Dedra Gores, MD 06/05/2024 2:33 PM  [facility] and [facility] Board certified by The [employer] of Sleep Medicine and Diplomate of the [employer] of Sleep Medicine. Board certified In Neurology through the ABPN, Fellow of the [employer] of Neurology. [facility]@ [email or web address].    Tips for Healthy Aging:   Read the MIND DIET book for nutritional information.  Regular physical activity and daylight exposure. This lifts the mood and entrains a circadian rhythm, leading to better sleep and frees up the mind.   Walking a minimum of  20 minutes a day , if possible outdoors, preferable in a park or nature area.  Indoor exercised such as stretching , yoga , stationary bike or stair master ( at the gym). Read !  Keep up with daily events , news.  Maintain or establish new Hobbies , consider Volunteering, and consider becoming a member of a club, church or other community memberships and engagements. Social interactions give purpose, joy and are interactive - Interaction is brain jogging!   Reconsider your driving ability-   Reconsider your home : The home is a single storey ( ranch/ apartment) ?  If you live in a multistory residence, are all stairs equipped with solid handrails on both sides ?  Can any existing staircase be retrofitted with a stair lift .  Do you need an entry ramp ?  The home that is suited to aging in space has low or no barriers to enter , to reach the lavatory  ( wide doors , high toilet seat, handrails), has a no barrier shower ( no tub ) with a scold guard water  temperature setting, handheld shower head , and a freestanding shower seat ( consider a plastic garden armchair !) .   And  are the doors wide enough to pass with walker  or wheelchair?  Is there enough space in the bedroom to reach the bed from two or better three sides.  De- clutter and arrange your home for safety: place objects at eye height , not above- not below hip height-  you should not need to use a stepstool or ladder.   No overlapping area rugs, sufficient light, and passage space free of furniture , remove breakable items from side tables, night stands.  A Kitchen stovetop powered by induction prevents injuries and fires- replace gas tops and toaster ovens.

## 2024-06-05 NOTE — Patient Instructions (Addendum)
 Problems With Thinking and Memory (Mild Neurocognitive Disorder): What to Know Mild neurocognitive disorder, formerly known as mild cognitive impairment, is a disorder where your memory doesn't work as well as it should. It may also affect other mental abilities like thinking, communicating, behavior, and being able to finish tasks. These problems can be noticed and measured. But they usually don't stop you from doing daily activities or living on your own. Mild neurocognitive disorder usually happens after 66 years of age. But it can also happen at younger ages. It's not as serious as major neurocognitive disorder, also known as dementia, but it may be the first sign of it. In general, the symptoms of this condition get worse over time. In rare cases, symptoms can get better. What are the causes? This condition may be caused by: Brain disorders like Alzheimer's disease, Parkinson's disease, and other conditions that slowly damage nerve cells. Diseases that affect the blood vessels in the brain and cause small strokes. Certain infections, like HIV. Traumatic brain injury. Other medical conditions, such as brain tumors, underactive thyroid  (hypothyroidism), and not having enough vitamin B12. Using certain drugs or medicines. What increases the risk? Being older than 65 years of age. Being female. Having a lower level of education. Diabetes, high blood pressure, high cholesterol, and other conditions that raise the risk for blood vessel diseases. Untreated or undertreated sleep apnea. Having a certain type of gene that can be inherited, or passed down from parent to child. Long-term health problems like heart disease, lung disease, liver disease, kidney disease, or depression. What are the signs or symptoms? Trouble remembering things. You may: Forget names, phone numbers, or details of recent events. Forget about social events and appointments. Often forget where you put your car keys or other  items. Trouble thinking and solving problems. You may have trouble with complex tasks like: Paying bills. Driving in places you don't know well. Trouble communicating. You may have trouble: Finding the right word or naming an object. Forming a sentence that makes sense. Understanding what you read or hear. Changes in your behavior or personality. When this happens, you may: Lose interest in the things you used to enjoy. Avoid being around people. Get angry more easily than usual. Act before thinking. How is this diagnosed? This condition is diagnosed based on: Your symptoms. Your health care provider may ask you and the people you spend time with, like family and friends, about your symptoms. Memory tests and other tests to check how your brain is working. Your provider may refer you to a provider called a neurologist or a mental health specialist. To try to find out the cause of your condition, your provider may: Get a detailed medical history. Ask about use of alcohol , drugs, and medicines. Do a physical exam. Order blood tests and brain imaging tests. How is this treated? Mild neurocognitive disorder that's caused by medicine use, drug use, infection, or another medical condition may get better when the cause is treated, or when medicines or drugs are stopped. If this disorder has another cause, it usually doesn't improve and may get worse. In these cases, the goal of treatment is to help you manage the symptoms. This may include: Medicines to help with memory and behavior symptoms. Talk therapy. This provides education, emotional support, memory aids, and other ways of making up for problems with mental tasks. Lifestyle changes. These may include: Getting regular exercise. Eating a healthy diet that includes omega-3 fatty acids. Doing things to challenge your thinking  and memory skills. Spending more time being with and talking to other people. Using routines like having regular  times for meals and going to bed. Follow these instructions at home: Eating and drinking  Drink more fluids as told. Eat a healthy diet that includes omega-3 fatty acids. These can be found in: Fish. Nuts. Leafy vegetables. Vegetable oils. If you drink alcohol : Limit how much you have to: 0-1 drink a day if you're female. 0-2 drinks a day if you're female. Know how much alcohol  is in your drink. In the [place] one drink is one 12 oz bottle of beer (355 mL), one 5 oz glass of wine (148 mL), or one 1 oz glass of hard liquor (44 mL). Lifestyle  Get regular exercise as told by your provider. Do not smoke, vape, or use nicotine or tobacco. Use healthy ways to manage stress. If you need help managing stress, ask your provider. Keep spending time with other people. Keep your mind active by doing activities you enjoy, like reading or playing games. Make sure you get good sleep at night. These tips can help: Try not to take naps during the day. Keep your bedroom dark and cool. Do not exercise in the few hours before you go to bed. Do not have foods or drinks with caffeine at night. General instructions Take medicines only as told. Your provider may tell you to avoid taking medicines that can affect thinking. These include some medicines for pain or sleeping. Work with your provider to find out: What things you need help with. What your safety needs are. Where to find more information [employer] on Aging: [email or web address] Contact a health care provider if: You have any new symptoms. Get help right away if: You have new confusion or your confusion gets worse. You act in ways that put you or your family in danger. This information is not intended to replace advice given to you by your health care provider. Make sure you discuss any questions you have with your health care provider.  As you know, your patient  Marie Farmer is,a 68 y.o. female presented here on 06-05-2024 for an  Evaluation of cognitive problems.  upon your request .   Her Montreal cognitive assessment test showed remarkable little impairment.  The patient was able to complete the visual-spatial and executive test part.  She could name 3 animals, she had some trouble with repeating digits backwards.  She also had trouble with the serial sevens but carried a mistake through she deducted 9 instead of 7 from 100.  The language part was intact she did generate only 7 words beginning with F.  Her short-term memory was 4 out of 5 points and she was fully oriented to place and date.   The test result, the physical exam or interview and her given examples of cognitive function decline indicate that she has had trouble with several domains of memory it is not truly short-term memory loss that is affecting her the most it seems to be the inability to carry a fall through so to hold on and complete a multistep, ask this affects driving and the orientation when she got lost a mile from her target, this has happened when she plays sudoku and she forgets pin numbers or passwords but these would not be short-term memory functions as she has established those months or years ago.   There is a possible overlap with her learning disabilities, and I think it would be best to have  to have her also evaluated by a neuropsychologist to just see if the processing speed is her main stumbling block and cognitive function.  In a patient who lives independent is very active physically capable and basically lives by herself by choice, I feel that her memory score may have never been better than today, She has test anxiety.   As well possible that Marie Farmer would not have generated 14 words beginning with F data 10 years ago.      My Plan is to proceed with:   I will provide an MRI of the brain I will also order a battery of laboratory test to make sure she is not at this time deficient in essential vitamins, her electrolytes now are  well-balanced, that she is no longer anemic etc. in addition I will order an ATN test which is the Alzheimer tangle and neurofibrillary test for blood. The ATN test shows as if the pathology of Alzheimer's disease is there often a decade before any symptoms arise.     1) GNA DEMENTIA PANEL  2) ATN 3) Late onset ALZHEIMER Genetic risk  4) EEG 5) MRI brain with and without , if not already done 6 ) Start Donepezil (unless patient has bradycardia , IBS or REM BD) at 5 mg for 90 days, then increase to 10 mg- if tolerated. 7) Referral to neuropsychology for cognitive testing. 8) Refer for vision and audiology testing if applicable.    The plan is to identify cognitive domains affected by changes, to rule out brain lesions or vascular abnormalities, to obtain testing of  AD bio-markers, genetic markers and , if applicable , consolidate these findings by a PET scan.  Serial testing  by [facility] or MMSE test is needed for all patients that are currently scoring in the subjective memory loss, MCI, or mild dementia category.   A referral for neuropsychological interview and testing battery  has been ordered.  Medication to help slow cognitive decline are available but may not be tolerated, these are available in oral and patch form and their use is not specific to a single form of neurodegenerative cognitive disorder.    Early stages and mild stages of AD may qualify for infusion therapy, based on MOCA/MMSE score and risk status for brain bleeding (AIDA) .    I plan to follow up  through our NP or personally within 6 months.    Document Revised: 02/20/2023 Document Reviewed: 02/20/2023 Elsevier Patient Education  2024 [employer].

## 2024-06-06 ENCOUNTER — Other Ambulatory Visit (HOSPITAL_BASED_OUTPATIENT_CLINIC_OR_DEPARTMENT_OTHER): Payer: Self-pay

## 2024-06-06 ENCOUNTER — Ambulatory Visit: Payer: Self-pay | Admitting: Neurology

## 2024-06-06 MED ORDER — GABAPENTIN 600 MG PO TABS
600.0000 mg | ORAL_TABLET | Freq: Three times a day (TID) | ORAL | 2 refills | Status: DC
  Filled 2024-06-06: qty 360, 120d supply, fill #0

## 2024-06-06 MED ORDER — ERGOCALCIFEROL 1.25 MG (50000 UT) PO CAPS
50000.0000 [IU] | ORAL_CAPSULE | ORAL | 4 refills | Status: DC
  Filled 2024-06-06: qty 12, 84d supply, fill #0

## 2024-06-06 MED ORDER — LEVOTHYROXINE SODIUM 75 MCG PO TABS
75.0000 ug | ORAL_TABLET | Freq: Every morning | ORAL | 11 refills | Status: AC
  Filled 2024-06-06: qty 90, 90d supply, fill #0
  Filled 2024-07-21: qty 30, 30d supply, fill #0
  Filled 2024-07-22 (×2): qty 90, 90d supply, fill #0
  Filled 2024-08-19: qty 90, 90d supply, fill #1

## 2024-06-06 MED ORDER — VALSARTAN-HYDROCHLOROTHIAZIDE 320-25 MG PO TABS
1.0000 | ORAL_TABLET | Freq: Every day | ORAL | 4 refills | Status: DC
  Filled 2024-06-06: qty 90, 90d supply, fill #0

## 2024-06-06 MED ORDER — MONTELUKAST SODIUM 10 MG PO TABS
10.0000 mg | ORAL_TABLET | Freq: Every day | ORAL | 3 refills | Status: DC
  Filled 2024-06-06: qty 90, 90d supply, fill #0

## 2024-06-06 MED ORDER — VALACYCLOVIR HCL 500 MG PO TABS
500.0000 mg | ORAL_TABLET | Freq: Two times a day (BID) | ORAL | 4 refills | Status: DC | PRN
  Filled 2024-06-06: qty 6, 3d supply, fill #0

## 2024-06-06 MED ORDER — FLUTICASONE PROPIONATE 50 MCG/ACT NA SUSP
2.0000 | Freq: Every day | NASAL | 3 refills | Status: AC
  Filled 2024-06-06: qty 16, 30d supply, fill #0

## 2024-06-06 MED ORDER — SULFASALAZINE 500 MG PO TABS
1500.0000 mg | ORAL_TABLET | Freq: Two times a day (BID) | ORAL | 4 refills | Status: DC
  Filled 2024-06-06: qty 540, 90d supply, fill #0

## 2024-06-06 MED ORDER — OMEPRAZOLE 20 MG PO CPDR
20.0000 mg | DELAYED_RELEASE_CAPSULE | Freq: Two times a day (BID) | ORAL | 4 refills | Status: AC
  Filled 2024-06-06: qty 180, 90d supply, fill #0
  Filled 2024-07-21: qty 60, 30d supply, fill #0

## 2024-06-09 ENCOUNTER — Other Ambulatory Visit (HOSPITAL_BASED_OUTPATIENT_CLINIC_OR_DEPARTMENT_OTHER): Payer: Self-pay

## 2024-06-09 ENCOUNTER — Telehealth: Payer: Self-pay | Admitting: Neurology

## 2024-06-09 NOTE — Telephone Encounter (Signed)
 no auth required sent to [facility] [phone]

## 2024-06-12 ENCOUNTER — Ambulatory Visit: Admitting: Adult Health

## 2024-06-16 LAB — APOE ALZHEIMER'S RISK

## 2024-06-16 LAB — PROTEIN ELECTROPHORESIS, SERUM
A/G Ratio: 1.1 (ref 0.7–1.7)
Albumin ELP: 3.6 g/dL (ref 2.9–4.4)
Alpha 1: 0.2 g/dL (ref 0.0–0.4)
Alpha 2: 0.8 g/dL (ref 0.4–1.0)
Beta: 1.2 g/dL (ref 0.7–1.3)
Gamma Globulin: 1 g/dL (ref 0.4–1.8)
Globulin, Total: 3.2 g/dL (ref 2.2–3.9)

## 2024-06-16 LAB — CBC WITH DIFFERENTIAL/PLATELET
Basophils Absolute: 0.1 x10E3/uL (ref 0.0–0.2)
Basos: 1 %
EOS (ABSOLUTE): 0.1 x10E3/uL (ref 0.0–0.4)
Eos: 2 %
Hematocrit: 33.8 % — ABNORMAL LOW (ref 34.0–46.6)
Hemoglobin: 10.6 g/dL — ABNORMAL LOW (ref 11.1–15.9)
Immature Grans (Abs): 0 x10E3/uL (ref 0.0–0.1)
Immature Granulocytes: 0 %
Lymphocytes Absolute: 1.6 x10E3/uL (ref 0.7–3.1)
Lymphs: 31 %
MCH: 29 pg (ref 26.6–33.0)
MCHC: 31.4 g/dL — ABNORMAL LOW (ref 31.5–35.7)
MCV: 93 fL (ref 79–97)
Monocytes Absolute: 0.3 x10E3/uL (ref 0.1–0.9)
Monocytes: 7 %
Neutrophils Absolute: 3 x10E3/uL (ref 1.4–7.0)
Neutrophils: 59 %
Platelets: 310 x10E3/uL (ref 150–450)
RBC: 3.65 x10E6/uL — ABNORMAL LOW (ref 3.77–5.28)
RDW: 12.6 % (ref 11.7–15.4)
WBC: 5.1 x10E3/uL (ref 3.4–10.8)

## 2024-06-16 LAB — ATN PROFILE
A -- Beta-amyloid 42/40 Ratio: 0.124 (ref >0.102)
Beta-amyloid 40: 183.85 pg/mL
Beta-amyloid 42: 22.79 pg/mL
N -- NfL, Plasma: 1.44 pg/mL (ref 0.00–3.65)
T -- p-tau181: 0.81 pg/mL (ref 0.00–0.97)

## 2024-06-16 LAB — TSH+FREE T4
Free T4: 1.03 ng/dL (ref 0.82–1.77)
TSH: 3.91 u[IU]/mL (ref 0.450–4.500)

## 2024-06-16 LAB — COMPREHENSIVE METABOLIC PANEL WITH GFR
ALT: 22 IU/L (ref 0–32)
AST: 24 IU/L (ref 0–40)
Albumin: 4.2 g/dL (ref 3.9–4.9)
Alkaline Phosphatase: 117 IU/L (ref 49–135)
BUN/Creatinine Ratio: 19 (ref 12–28)
BUN: 15 mg/dL (ref 8–27)
Bilirubin Total: 0.2 mg/dL (ref 0.0–1.2)
CO2: 25 mmol/L (ref 20–29)
Calcium: 10 mg/dL (ref 8.7–10.3)
Chloride: 103 mmol/L (ref 96–106)
Creatinine, Ser: 0.77 mg/dL (ref 0.57–1.00)
Globulin, Total: 2.6 g/dL (ref 1.5–4.5)
Glucose: 94 mg/dL (ref 70–99)
Potassium: 4 mmol/L (ref 3.5–5.2)
Sodium: 142 mmol/L (ref 134–144)
Total Protein: 6.8 g/dL (ref 6.0–8.5)
eGFR: 85 mL/min/1.73 (ref >59)

## 2024-06-16 LAB — RPR: RPR Ser Ql: NONREACTIVE

## 2024-06-16 LAB — HEMOGLOBIN A1C
Est. average glucose Bld gHb Est-mCnc: 94 mg/dL
Hgb A1c MFr Bld: 4.9 % (ref 4.8–5.6)

## 2024-06-16 LAB — SEDIMENTATION RATE: Sed Rate: 20 mm/h (ref 0–40)

## 2024-06-16 LAB — HIV ANTIBODY (ROUTINE TESTING W REFLEX): HIV Screen 4th Generation wRfx: NONREACTIVE

## 2024-06-16 LAB — HOMOCYSTEINE: Homocysteine: 9.7 umol/L (ref 0.0–17.2)

## 2024-06-16 LAB — ANA W/REFLEX: Anti Nuclear Antibody (ANA): NEGATIVE

## 2024-06-16 LAB — VITAMIN B12: Vitamin B-12: 487 pg/mL (ref 232–1245)

## 2024-06-16 LAB — METHYLMALONIC ACID, SERUM: Methylmalonic Acid: 165 nmol/L (ref 0–378)

## 2024-06-17 ENCOUNTER — Encounter: Admitting: Physical Therapy

## 2024-06-19 ENCOUNTER — Encounter: Admitting: Physical Therapy

## 2024-06-20 ENCOUNTER — Telehealth: Payer: Self-pay | Admitting: Neurology

## 2024-06-20 NOTE — Telephone Encounter (Signed)
 Pt called  wanting to request  to speak to MD about  if  Pt can  take Muscle relaxer on the day of EEG . PT wanted to make sure if it was ok or not .

## 2024-06-20 NOTE — Telephone Encounter (Signed)
 Patient has an appointment for EEG on 10/16. Please advise if patient can take a muscle relaxer on the day of EEG.

## 2024-06-23 ENCOUNTER — Encounter: Admitting: Physical Therapy

## 2024-06-23 DIAGNOSIS — M25511 Pain in right shoulder: Secondary | ICD-10-CM | POA: Diagnosis not present

## 2024-06-23 DIAGNOSIS — M542 Cervicalgia: Secondary | ICD-10-CM | POA: Diagnosis not present

## 2024-06-23 DIAGNOSIS — M25512 Pain in left shoulder: Secondary | ICD-10-CM | POA: Diagnosis not present

## 2024-06-24 DIAGNOSIS — F341 Dysthymic disorder: Secondary | ICD-10-CM | POA: Diagnosis not present

## 2024-06-25 ENCOUNTER — Encounter: Admitting: Physical Therapy

## 2024-06-25 NOTE — Telephone Encounter (Signed)
 Received call from [place] at [facility]. Stated somehow pt got their number and called them to schedule because she was waiting so long to hear form Zelda Salmon. She scheduled patient there at [facility] for 10/27. Wanting to make sure it is okay that the MRI is scheduled there instead of [facility]. Can call Sharlet back at [phone]

## 2024-06-25 NOTE — Telephone Encounter (Signed)
 That is fine. I changed the location on the order.

## 2024-06-26 ENCOUNTER — Other Ambulatory Visit: Payer: Self-pay | Admitting: Sports Medicine

## 2024-06-26 ENCOUNTER — Ambulatory Visit: Admitting: *Deleted

## 2024-06-26 DIAGNOSIS — M542 Cervicalgia: Secondary | ICD-10-CM

## 2024-06-26 NOTE — Telephone Encounter (Signed)
 Patient took her muscle relaxer last night and was informed to avoid all caffeinated drinks until after her 3pm EEG. Patient verbalized understanding.

## 2024-06-27 ENCOUNTER — Other Ambulatory Visit (HOSPITAL_BASED_OUTPATIENT_CLINIC_OR_DEPARTMENT_OTHER): Payer: Self-pay

## 2024-06-27 DIAGNOSIS — M069 Rheumatoid arthritis, unspecified: Secondary | ICD-10-CM | POA: Diagnosis not present

## 2024-06-27 DIAGNOSIS — K219 Gastro-esophageal reflux disease without esophagitis: Secondary | ICD-10-CM | POA: Diagnosis not present

## 2024-06-27 DIAGNOSIS — Z299 Encounter for prophylactic measures, unspecified: Secondary | ICD-10-CM | POA: Diagnosis not present

## 2024-06-27 DIAGNOSIS — I272 Pulmonary hypertension, unspecified: Secondary | ICD-10-CM | POA: Diagnosis not present

## 2024-06-27 DIAGNOSIS — I10 Essential (primary) hypertension: Secondary | ICD-10-CM | POA: Diagnosis not present

## 2024-06-27 DIAGNOSIS — M545 Low back pain, unspecified: Secondary | ICD-10-CM | POA: Diagnosis not present

## 2024-06-27 MED ORDER — FAMOTIDINE 20 MG PO TABS
20.0000 mg | ORAL_TABLET | Freq: Every day | ORAL | 3 refills | Status: AC
  Filled 2024-06-27: qty 90, 90d supply, fill #0
  Filled 2024-07-21: qty 30, 30d supply, fill #0

## 2024-06-27 MED ORDER — MONTELUKAST SODIUM 10 MG PO TABS
10.0000 mg | ORAL_TABLET | Freq: Every day | ORAL | 3 refills | Status: AC
  Filled 2024-06-27: qty 90, 90d supply, fill #0
  Filled 2024-07-21 – 2024-07-22 (×2): qty 30, 30d supply, fill #0
  Filled 2024-08-19: qty 30, 30d supply, fill #1
  Filled ????-??-??: fill #1

## 2024-06-27 MED ORDER — VALSARTAN 320 MG PO TABS
320.0000 mg | ORAL_TABLET | Freq: Every day | ORAL | 4 refills | Status: DC
  Filled 2024-06-27: qty 90, 90d supply, fill #0

## 2024-06-27 MED ORDER — CETIRIZINE HCL 10 MG PO TABS
10.0000 mg | ORAL_TABLET | Freq: Every day | ORAL | 4 refills | Status: AC
  Filled 2024-06-27: qty 90, 90d supply, fill #0
  Filled 2024-07-21 – 2024-07-22 (×2): qty 30, 30d supply, fill #0
  Filled 2024-08-19: qty 30, 30d supply, fill #1
  Filled ????-??-??: fill #1

## 2024-06-27 MED ORDER — TIZANIDINE HCL 4 MG PO TABS
4.0000 mg | ORAL_TABLET | Freq: Every day | ORAL | 3 refills | Status: AC
  Filled 2024-06-27: qty 90, 90d supply, fill #0
  Filled 2024-07-21 – 2024-07-22 (×2): qty 30, 30d supply, fill #0
  Filled 2024-08-19: qty 30, 30d supply, fill #1
  Filled ????-??-??: fill #1

## 2024-06-27 MED ORDER — GABAPENTIN 600 MG PO TABS
600.0000 mg | ORAL_TABLET | Freq: Three times a day (TID) | ORAL | 3 refills | Status: DC
  Filled 2024-06-27: qty 270, 90d supply, fill #0

## 2024-06-27 MED ORDER — VITAMIN D (ERGOCALCIFEROL) 1.25 MG (50000 UNIT) PO CAPS
50000.0000 [IU] | ORAL_CAPSULE | ORAL | 4 refills | Status: AC
  Filled 2024-06-27: qty 12, 84d supply, fill #0

## 2024-06-27 MED ORDER — SULFASALAZINE 500 MG PO TABS
1500.0000 mg | ORAL_TABLET | Freq: Two times a day (BID) | ORAL | 4 refills | Status: AC
  Filled 2024-06-27: qty 540, 90d supply, fill #0
  Filled 2024-07-21 – 2024-07-22 (×2): qty 180, 30d supply, fill #0
  Filled 2024-08-19: qty 180, 30d supply, fill #1
  Filled ????-??-??: fill #1

## 2024-06-30 ENCOUNTER — Telehealth: Payer: Self-pay | Admitting: Cardiology

## 2024-06-30 ENCOUNTER — Ambulatory Visit: Attending: Podiatry | Admitting: Physical Therapy

## 2024-06-30 ENCOUNTER — Encounter: Payer: Self-pay | Admitting: Physical Therapy

## 2024-06-30 DIAGNOSIS — M542 Cervicalgia: Secondary | ICD-10-CM | POA: Insufficient documentation

## 2024-06-30 DIAGNOSIS — M6281 Muscle weakness (generalized): Secondary | ICD-10-CM | POA: Diagnosis not present

## 2024-06-30 NOTE — Telephone Encounter (Signed)
 06/30/24 LMOVM  - to confirm appt for 07-02-24 with Dr. Alvan.

## 2024-06-30 NOTE — Telephone Encounter (Signed)
 Pt returning call, did not see a note. Please advise.

## 2024-06-30 NOTE — Therapy (Signed)
 OUTPATIENT PHYSICAL THERAPY EVALUATION   Patient Name: Marie Farmer MRN: [id number] DOB:[birth year], 68 y.o., female Today's Date: 06/30/2024  END OF SESSION:  PT End of Session - 06/30/24 1100     Visit Number 1    Number of Visits 12    Date for Recertification  08/11/24    Progress Note Due on Visit 10    PT Start Time 0930    PT Stop Time 1016    PT Time Calculation (min) 46 min    Activity Tolerance Patient tolerated treatment well    Behavior During Therapy WFL for tasks assessed/performed          Past Medical History:  Diagnosis Date   Anxiety    Asthma    DDD (degenerative disc disease)    chronic back pain   Fibromyalgia    Foot pain    GERD (gastroesophageal reflux disease)    Hypertension    IBS (irritable bowel syndrome)    Mood disorder    PONV (postoperative nausea and vomiting)    Sleep apnea    Venous insufficiency    Wears glasses    Past Surgical History:  Procedure Laterality Date   CARPAL TUNNEL RELEASE Right 06/10/2013   Procedure: CARPAL TUNNEL RELEASE;  Surgeon: Arley JONELLE Curia, MD;  Location: [facility];  Service: Orthopedics;  Laterality: Right;   CHOLECYSTECTOMY N/A 08/19/2021   Procedure: LAPAROSCOPIC CHOLECYSTECTOMY;  Surgeon: Kallie Manuelita BROCKS, MD;  Location: [facility];  Service: General;  Laterality: N/A;   COLONOSCOPY  12/20/2011   [name]-single diminutive sessile polyp in the sigmoid colon/small interenal hemorrhoids   COLONOSCOPY N/A 11/28/2017   Procedure: COLONOSCOPY;  Surgeon: Shaaron Lamar HERO, MD;  Location: [facility];  Service: Endoscopy;  Laterality: N/A;  10:30   COLONOSCOPY WITH PROPOFOL  N/A 07/07/2021   one 9 mm polyp in rectum s/p removal (serrated adenoma). surveillance in 5 years   ESOPHAGOGASTRODUODENOSCOPY  04/10/2003   Normal esophagus/Antral erosions, as described above.  The remainder of the stomach appeared normal.  Normal first and second parts of the duodenum.   ESOPHAGOGASTRODUODENOSCOPY   12/20/2011   [name]-hiatus hernia/chronic gastritis/normal duodenumNEGATIVE H pylori, negative SB biopsy   POLYPECTOMY  07/07/2021   Procedure: POLYPECTOMY;  Surgeon: Shaaron Lamar HERO, MD;  Location: [facility];  Service: Endoscopy;;   TIBIA FRACTURE SURGERY  09/11/2010   right   Patient Active Problem List   Diagnosis Date Noted   MCI (mild cognitive impairment) 06/05/2024   Dyslexia 06/05/2024   Family history of dementia 06/05/2024   Internal hemorrhoids 07/20/2022   Anal fissure 07/20/2022   Bunion 12/19/2021   DDD (degenerative disc disease), lumbar 12/12/2021   Fibromyalgia 12/12/2021   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   Other specified nontoxic goiter 12/12/2021   Anemia of chronic disease 08/02/2021   Ankle impingement syndrome, left 03/25/2020   Osteochondritis dissecans of talus, left 03/25/2020   Instability of left ankle joint 02/03/2020   Plantar fasciitis 04/18/2018   Aphthous ulcer of mouth 04/02/2018   Difficulty sleeping 04/02/2018   Under emotional stress 04/02/2018   OSA on CPAP 02/01/2018   Dyspnea on exertion 10/16/2016   Essential hypertension 10/16/2016   Morbid obesity due to excess calories (HCC) 10/16/2016   IBS (irritable bowel syndrome) 02/29/2012   Tibial plateau fracture 05/10/2011   Helicobacter pylori infection 08/25/2009   DEPRESSION 08/25/2009   Asthma 08/25/2009   GASTROESOPHAGEAL REFLUX DISEASE, CHRONIC 08/25/2009   Allergy  08/25/2009  Cholelithiasis 08/25/2009    PCP: Rosamond Leta NOVAK, MD   REFERRING PROVIDER: Orpha Stabs  REFERRING DIAG: F45.87 Cervical radiculopathy  Rationale for Evaluation and Treatment:  Rehabiliation  THERAPY DIAG:  Cervicalgia  Muscle weakness (generalized)  ONSET DATE: 5 years of neck pain   SUBJECTIVE:                                                                                                                                                                                            SUBJECTIVE STATEMENT: She says 5 years of neck pain, intermittent can run down her left arm or right arm, it changes and does not always happen. Pain has become worse lately and can go into her mid back. Her sleep apnea gear bothers her sleeping. Her neck feels stiff like it needs stretching  PERTINENT HISTORY:  See above PMH  PAIN:  NPRS scale: 7/10 upon arrival Pain location:Base of neck and upper back, can run down her arms Pain description: constant, heavy, achy Aggravating factors: using pillow, pressure on her neck, sewing Relieving factors: laying down without a pillow   PRECAUTIONS: ,  None  RED FLAGS: None   WEIGHT BEARING RESTRICTIONS:  No  FALLS:  Has patient fallen in last 6 months? No   PLOF:  Independent  PATIENT GOALS:  Reduce pain  OBJECTIVE:  Note: Objective measures were completed at Evaluation unless otherwise noted.  DIAGNOSTIC FINDINGS:  Has MRI scheduled 07/10/24  PATIENT SURVEYS:  Patient-Specific Activity Scoring Scheme  0 represents "unable to perform." 10 represents "able to perform at prior level. 0 1 2 3 4 5 6 7 8 9  10 (Date and Score)   Activity Eval     1. Reaching overhead 5    2. Exercise classes 5    3.     4.    5.    Score 5/10    Total score = sum of the activity scores/number of activities Minimum detectable change (90%CI) for average score = 2 points Minimum detectable change (90%CI) for single activity score = 3 points     EDEMA:  No   CERVICAL ROM:   Active ROM AROM (deg) eval  Flexion 20  Extension 30  Right lateral flexion 40  Left lateral flexion 40  Right rotation 70  Left rotation 70   (Blank rows = not tested)   UPPER EXTREMITY MMT:  MMT Right eval Left eval  Shoulder flexion 5 5  Shoulder extension    Shoulder abduction 5 5  Shoulder adduction    Shoulder extension    Shoulder internal rotation 5 5  Shoulder external rotation  4 4  Middle  trapezius    Lower trapezius    Elbow flexion    Elbow extension    Wrist flexion    Wrist extension    Wrist ulnar deviation    Wrist radial deviation    Wrist pronation    Wrist supination    Grip strength     (Blank rows = not tested)                                                                                                                           TREATMENT DATE:  Eval HEP creation and review with demonstration and trial set preformed, see below for details Mechanical cervical traction 17-12# intermittent X 15 min    PATIENT EDUCATION: Education details: HEP, PT plan of care,  Person educated: Patient Education method: Explanation, Demonstration, Verbal cues, and Handouts Education comprehension: verbalized understanding, further education recommended   HOME EXERCISE PROGRAM: Access Code: [email or web address] URL: [email or web address] Date: 06/30/2024 Prepared by: Redell Moose  Exercises - Seated Cervical Retraction  - 2 x daily - 6 x weekly - 2 sets - 10 reps - Seated Levator Scapulae Stretch  - 2 x daily - 6 x weekly - 1 sets - 3 reps - 30 sec hold - Seated Cervical Sidebending Stretch (Mirrored)  - 2 x daily - 6 x weekly - 1 sets - 3 reps - 30 sec hold - Seated Thoracic Self Mobilization  - 1 x daily - 7 x weekly - 1 sets - 10 reps - 5 sec hold - Seated Assisted Cervical Rotation with Towel  - 2 x daily - 6 x weekly - 1 sets - 10 reps - 5 sec hold  ASSESSMENT:  CLINICAL IMPRESSION: Patient referred to PT for cervical radiculopathy. She may also have some left RTC injury as she is weak with external rotation in this arm and gets pain reaching overhead. Patient will benefit from skilled PT to improve overall function and to address impairments and limitations listed below.  OBJECTIVE IMPAIRMENTS: decreased activity tolerance for ADL's, decreased ROM, decreased strength, impaired flexibility, impaired UE use, and pain.  ACTIVITY LIMITATIONS: exercise  classes, cleaning, community activity, driving, reaching, carry PERSONAL FACTORS: see above PMH  also affecting patient's functional outcome.  REHAB POTENTIAL: Good  CLINICAL DECISION MAKING: Evolving/moderate complexity  EVALUATION COMPLEXITY: Moderate    GOALS: Short term PT Goals Target date: 07/28/2024   Pt will be I and compliant with HEP. Baseline:  Goal status: New Pt will decrease pain by 25% overall Baseline: Goal status: New  Long term PT goals Target date:08/11/2024   Pt will improve neck ROM to [facility] to improve functional mobility Baseline: Goal status: New Pt will improve left shoulder strength to at least 4+/5 MMT to improve functional strength Baseline: Goal status: New Pt will improve Patient specific functional scale (PSFS) to at least 7/10 to show improved function level Baseline: Goal status: New Pt will reduce pain to overall less than  3/10 with usual activity and work activity. Baseline: Goal status: New  PLAN: PT FREQUENCY: 1-2 times per week   PT DURATION: 4-6 weeks  PLANNED INTERVENTIONS  97110-Therapeutic exercises, 97530- Therapeutic activity, [id number]- Neuromuscular re-education, 97535- Self Care, 02859- Manual therapy, G0283- Electrical stimulation (unattended), 97035- Ultrasound, 02987- Traction (mechanical), and 97033- Ionotophoresis 4mg /ml Dexamethasone   PLAN FOR NEXT SESSION: how was traction, She will have MRI 10/30 so look for results, add in RTC exercises as neck pain calms down some.  NEXT MD VISIT: ?  Redell JONELLE Moose, PT,DPT 06/30/2024, 11:01 AM

## 2024-06-30 NOTE — Telephone Encounter (Signed)
 Sharlet from [facility] called again wanting to see if it was ok that patient scheduled with them. Relayed Tori's note that she updated the location on the order.

## 2024-07-01 ENCOUNTER — Other Ambulatory Visit (HOSPITAL_BASED_OUTPATIENT_CLINIC_OR_DEPARTMENT_OTHER): Payer: Self-pay

## 2024-07-02 ENCOUNTER — Ambulatory Visit: Attending: Podiatry | Admitting: Physical Therapy

## 2024-07-02 ENCOUNTER — Encounter: Payer: Self-pay | Admitting: Cardiology

## 2024-07-02 ENCOUNTER — Encounter: Payer: Self-pay | Admitting: Physical Therapy

## 2024-07-02 ENCOUNTER — Ambulatory Visit: Attending: Cardiology | Admitting: Cardiology

## 2024-07-02 VITALS — BP 140/70 | HR 80 | Ht 63.0 in | Wt 210.2 lb

## 2024-07-02 DIAGNOSIS — M542 Cervicalgia: Secondary | ICD-10-CM

## 2024-07-02 DIAGNOSIS — R0609 Other forms of dyspnea: Secondary | ICD-10-CM | POA: Diagnosis not present

## 2024-07-02 DIAGNOSIS — I10 Essential (primary) hypertension: Secondary | ICD-10-CM

## 2024-07-02 DIAGNOSIS — I272 Pulmonary hypertension, unspecified: Secondary | ICD-10-CM | POA: Diagnosis not present

## 2024-07-02 DIAGNOSIS — M6281 Muscle weakness (generalized): Secondary | ICD-10-CM

## 2024-07-02 NOTE — Therapy (Signed)
 OUTPATIENT PHYSICAL THERAPY EVALUATION   Patient Name: Marie Farmer MRN: [id number] DOB:1956-05-02, 68 y.o., female Today's Date: 07/02/2024  END OF SESSION:  PT End of Session - 07/02/24 1035     Visit Number 2    Number of Visits 12    Date for Recertification  08/11/24    Progress Note Due on Visit 10    PT Start Time 1015    PT Stop Time 1100    PT Time Calculation (min) 45 min    Activity Tolerance Patient tolerated treatment well    Behavior During Therapy WFL for tasks assessed/performed          Past Medical History:  Diagnosis Date   Anxiety    Asthma    DDD (degenerative disc disease)    chronic back pain   Fibromyalgia    Foot pain    GERD (gastroesophageal reflux disease)    Hypertension    IBS (irritable bowel syndrome)    Mood disorder    PONV (postoperative nausea and vomiting)    Sleep apnea    Venous insufficiency    Wears glasses    Past Surgical History:  Procedure Laterality Date   CARPAL TUNNEL RELEASE Right 06/10/2013   Procedure: CARPAL TUNNEL RELEASE;  Surgeon: Arley JONELLE Curia, MD;  Location: [facility];  Service: Orthopedics;  Laterality: Right;   CHOLECYSTECTOMY N/A 08/19/2021   Procedure: LAPAROSCOPIC CHOLECYSTECTOMY;  Surgeon: Kallie Manuelita BROCKS, MD;  Location: [facility];  Service: General;  Laterality: N/A;   COLONOSCOPY  12/20/2011   [name]-single diminutive sessile polyp in the sigmoid colon/small interenal hemorrhoids   COLONOSCOPY N/A 11/28/2017   Procedure: COLONOSCOPY;  Surgeon: Shaaron Lamar HERO, MD;  Location: [facility];  Service: Endoscopy;  Laterality: N/A;  10:30   COLONOSCOPY WITH PROPOFOL  N/A 07/07/2021   one 9 mm polyp in rectum s/p removal (serrated adenoma). surveillance in 5 years   ESOPHAGOGASTRODUODENOSCOPY  04/10/2003   Normal esophagus/Antral erosions, as described above.  The remainder of the stomach appeared normal.  Normal first and second parts of the duodenum.   ESOPHAGOGASTRODUODENOSCOPY   12/20/2011   [name]-hiatus hernia/chronic gastritis/normal duodenumNEGATIVE H pylori, negative SB biopsy   POLYPECTOMY  07/07/2021   Procedure: POLYPECTOMY;  Surgeon: Shaaron Lamar HERO, MD;  Location: [facility];  Service: Endoscopy;;   TIBIA FRACTURE SURGERY  09/11/2010   right   Patient Active Problem List   Diagnosis Date Noted   MCI (mild cognitive impairment) 06/05/2024   Dyslexia 06/05/2024   Family history of dementia 06/05/2024   Internal hemorrhoids 07/20/2022   Anal fissure 07/20/2022   Bunion 12/19/2021   DDD (degenerative disc disease), lumbar 12/12/2021   Fibromyalgia 12/12/2021   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   Other specified nontoxic goiter 12/12/2021   Anemia of chronic disease 08/02/2021   Ankle impingement syndrome, left 03/25/2020   Osteochondritis dissecans of talus, left 03/25/2020   Instability of left ankle joint 02/03/2020   Plantar fasciitis 04/18/2018   Aphthous ulcer of mouth 04/02/2018   Difficulty sleeping 04/02/2018   Under emotional stress 04/02/2018   OSA on CPAP 02/01/2018   Dyspnea on exertion 10/16/2016   Essential hypertension 10/16/2016   Morbid obesity due to excess calories (HCC) 10/16/2016   IBS (irritable bowel syndrome) 02/29/2012   Tibial plateau fracture 05/10/2011   Helicobacter pylori infection 08/25/2009   DEPRESSION 08/25/2009   Asthma 08/25/2009   GASTROESOPHAGEAL REFLUX DISEASE, CHRONIC 08/25/2009   Allergy  08/25/2009  Cholelithiasis 08/25/2009    PCP: Rosamond Leta NOVAK, MD   REFERRING PROVIDER: Orpha Stabs  REFERRING DIAG: F45.87 Cervical radiculopathy  Rationale for Evaluation and Treatment:  Rehabiliation  THERAPY DIAG:  Cervicalgia  Muscle weakness (generalized)  ONSET DATE: 5 years of neck pain   SUBJECTIVE:                                                                                                                                                                                            SUBJECTIVE STATEMENT: Relays the traction machine helped some, would like to have this again.   PERTINENT HISTORY:  See above PMH  PAIN:  NPRS scale: 5/10 upon arrival Pain location:Base of neck and upper back, can run down her arms Pain description: constant, heavy, achy Aggravating factors: using pillow, pressure on her neck, sewing Relieving factors: laying down without a pillow   PRECAUTIONS: ,  None  RED FLAGS: None   WEIGHT BEARING RESTRICTIONS:  No  FALLS:  Has patient fallen in last 6 months? No   PLOF:  Independent  PATIENT GOALS:  Reduce pain  OBJECTIVE:  Note: Objective measures were completed at Evaluation unless otherwise noted.  DIAGNOSTIC FINDINGS:  Has MRI scheduled 07/10/24  PATIENT SURVEYS:  Patient-Specific Activity Scoring Scheme  0 represents "unable to perform." 10 represents "able to perform at prior level. 0 1 2 3 4 5 6 7 8 9  10 (Date and Score)   Activity Eval     1. Reaching overhead 5    2. Exercise classes 5    3.     4.    5.    Score 5/10    Total score = sum of the activity scores/number of activities Minimum detectable change (90%CI) for average score = 2 points Minimum detectable change (90%CI) for single activity score = 3 points     EDEMA:  No   CERVICAL ROM:   Active ROM AROM (deg) eval  Flexion 20  Extension 30  Right lateral flexion 40  Left lateral flexion 40  Right rotation 70  Left rotation 70   (Blank rows = not tested)   UPPER EXTREMITY MMT:  MMT Right eval Left eval  Shoulder flexion 5 5  Shoulder extension    Shoulder abduction 5 5  Shoulder adduction    Shoulder extension    Shoulder internal rotation 5 5  Shoulder external rotation 4 4  Middle trapezius    Lower trapezius    Elbow flexion    Elbow extension    Wrist flexion    Wrist extension    Wrist ulnar deviation  Wrist radial deviation    Wrist pronation    Wrist supination     Grip strength     (Blank rows = not tested)                                                                                                                           TREATMENT DATE:  07/02/24 HEP review with demonstration and trial set preformed, see below for details Seated cervical retractions X 10 Seated levator strech 30 sec X 3 bilat Seated upper trap stretch 30 sec X 3 bilat Seated thoracic extensions 5 sec X 10 Seated neck rotation stretch AAROM with towel 5 sec X 10 bilat Standing left shoulder ER with red 2X10 Standing left shoulder IR with red 2X10 Mechanical cervical traction 18-13# intermittent X 20 min    PATIENT EDUCATION: Education details: HEP, PT plan of care,  Person educated: Patient Education method: Explanation, Demonstration, Verbal cues, and Handouts Education comprehension: verbalized understanding, further education recommended   HOME EXERCISE PROGRAM: Access Code: [email or web address] URL: [email or web address] Date: 07/02/2024 Prepared by: Redell Moose  Exercises - Seated Cervical Retraction  - 2 x daily - 6 x weekly - 2 sets - 10 reps - Seated Levator Scapulae Stretch  - 2 x daily - 6 x weekly - 1 sets - 3 reps - 30 sec hold - Seated Cervical Sidebending Stretch (Mirrored)  - 2 x daily - 6 x weekly - 1 sets - 3 reps - 30 sec hold - Seated Thoracic Self Mobilization  - 1 x daily - 7 x weekly - 1 sets - 10 reps - 5 sec hold - Seated Assisted Cervical Rotation with Towel  - 2 x daily - 6 x weekly - 1 sets - 10 reps - 5 sec hold - Shoulder External Rotation with Anchored Resistance  - 2 x daily - 6 x weekly - 2 sets - 15 reps - Shoulder Internal Rotation with Resistance (Mirrored)  - 2 x daily - 6 x weekly - 2 sets - 15 reps  ASSESSMENT:  CLINICAL IMPRESSION: She had positive result from mechanical cervical traction so this was continued today. I then reviewed her home exercises with her and added 2 more for RTC as well. She shows good  understanding of them.   Per eval: Patient referred to PT for cervical radiculopathy. She may also have some left RTC injury as she is weak with external rotation in this arm and gets pain reaching overhead. Patient will benefit from skilled PT to improve overall function and to address impairments and limitations listed below.  OBJECTIVE IMPAIRMENTS: decreased activity tolerance for ADL's, decreased ROM, decreased strength, impaired flexibility, impaired UE use, and pain.  ACTIVITY LIMITATIONS: exercise classes, cleaning, community activity, driving, reaching, carry PERSONAL FACTORS: see above PMH  also affecting patient's functional outcome.  REHAB POTENTIAL: Good  CLINICAL DECISION MAKING: Evolving/moderate complexity  EVALUATION COMPLEXITY: Moderate    GOALS: Short term PT Goals Target date: 07/28/2024  Pt will be I and compliant with HEP. Baseline:  Goal status: New Pt will decrease pain by 25% overall Baseline: Goal status: New  Long term PT goals Target date:08/11/2024   Pt will improve neck ROM to [facility] to improve functional mobility Baseline: Goal status: New Pt will improve left shoulder strength to at least 4+/5 MMT to improve functional strength Baseline: Goal status: New Pt will improve Patient specific functional scale (PSFS) to at least 7/10 to show improved function level Baseline: Goal status: New Pt will reduce pain to overall less than 3/10 with usual activity and work activity. Baseline: Goal status: New  PLAN: PT FREQUENCY: 1-2 times per week   PT DURATION: 4-6 weeks  PLANNED INTERVENTIONS  97110-Therapeutic exercises, 97530- Therapeutic activity, [id number]- Neuromuscular re-education, 97535- Self Care, 02859- Manual therapy, G0283- Electrical stimulation (unattended), [id number]- Ultrasound, 02987- Traction (mechanical), and 97033- Ionotophoresis 4mg /ml Dexamethasone   PLAN FOR NEXT SESSION: traction if desired.  She will have MRI 10/30 so look for  results, add in RTC exercises as neck pain calms down some.  NEXT MD VISIT: ?  Redell JONELLE Moose, PT,DPT 07/02/2024, 10:36 AM

## 2024-07-02 NOTE — Patient Instructions (Addendum)
 Medication Instructions:  Your physician recommends that you continue on your current medications as directed. Please refer to the Current Medication list given to you today.  Labwork: none  Testing/Procedures: none  Follow-Up: Your physician recommends that you schedule a follow-up appointment in: 1 year. You will receive a reminder call in about 8-10 months reminding you to schedule your appointment. If you don't receive this call, please contact our office.  Any Other Special Instructions Will Be Listed Below (If Applicable). Your physician has requested that you regularly monitor and record your blood pressure readings at home. Please use the same machine at the same time of day to check your readings and record them. Please send your home blood pressure readings to our office in one week via mychart.   If you need a refill on your cardiac medications before your next appointment, please call your pharmacy.

## 2024-07-02 NOTE — Progress Notes (Signed)
 Clinical Summary [name] is a 68 y.o.female  1.Dyspnea - DOE she noted after walking up from her chickens - she noted significant improvement after stopping her metoprolol but was not resolved. She does report a history of asthma.    -10/2023 echo pcp: LVEF 58%, grade I dd, normal RV, mild MR, mild to mod TR, mod pulm HTN (RVSP 41) - from notes suspect obesity hypovent. Has known OSA.  - 11/2023 PFTs normal - 03/2024 High res CT: no specific findings - reports breathing has improved.      2.OSA - followed by pulmonary, compliant with cpap     3. HTN - she is on valsartan /hydrochlorothiazide  - compliant with meds - recent neuro visit 133/79    4. Mild intermittent asthma - followed by pulmonary   Past Medical History:  Diagnosis Date   Anxiety    Asthma    DDD (degenerative disc disease)    chronic back pain   Fibromyalgia    Foot pain    GERD (gastroesophageal reflux disease)    Hypertension    IBS (irritable bowel syndrome)    Mood disorder    PONV (postoperative nausea and vomiting)    Sleep apnea    Venous insufficiency    Wears glasses      Allergies  Allergen Reactions   Ace Inhibitors Shortness Of Breath   Beta Adrenergic Blockers Shortness Of Breath   Adhesive [Tape]     Peels skin   Cymbalta [Duloxetine Hcl]     Gums red, mouth felt hout   Methylprednisolone      Redness all over   Prednisone Other (See Comments)    Redness all over   Azithromycin Rash   Latex Rash    Sensitive skin, causes redness    Nabumetone Rash and Other (See Comments)    lips swollen       Current Outpatient Medications  Medication Sig Dispense Refill   Biotin 1000 MCG CHEW Chew 1 tablet by mouth daily.     cetirizine (ZYRTEC) 10 MG tablet Take 1 tablet (10 mg total) by mouth at bedtime. 90 tablet 4   famotidine (PEPCID) 20 MG tablet Take 1 tablet (20 mg total) by mouth daily. 90 tablet 3   fluticasone  (FLONASE ) 50 MCG/ACT nasal spray Place 2 sprays  into both nostrils daily. 16 g 3   gabapentin  (NEURONTIN ) 600 MG tablet Take 1 tablet (600 mg total) by mouth 3 (three) times daily. 360 tablet 2   levothyroxine  (SYNTHROID ) 75 MCG tablet Take 1 tablet (75 mcg total) by mouth in the morning on an empty stomach. 90 tablet 11   Magnesium 400 MG CAPS Take 2 tablets by mouth daily.     montelukast  (SINGULAIR ) 10 MG tablet Take 1 tablet (10 mg total) by mouth daily. 90 tablet 3   sulfaSALAzine  (AZULFIDINE ) 500 MG tablet Take 3 tablets (1,500 mg total) by mouth 2 (two) times daily. 540 tablet 4   tiZANidine  (ZANAFLEX ) 4 MG tablet Take 1 tablet (4 mg total) by mouth at bedtime. 90 tablet 3   valACYclovir  (VALTREX ) 500 MG tablet Take 1 tablet (500 mg total) by mouth every 12 (twelve) hours for 3 days as needed. 6 tablet 4   valsartan -hydrochlorothiazide  (DIOVAN -HCT) 320-25 MG tablet Take 1 tablet by mouth daily.     Vitamin D , Ergocalciferol , (DRISDOL ) 1.25 MG (50000 UNIT) CAPS capsule Take 1 capsule (50,000 Units total) by mouth once a week. 12 capsule 4   omeprazole  (PRILOSEC)  20 MG capsule Take 1 capsule (20 mg total) by mouth 2 (two) times daily. (Patient not taking: Reported on 07/02/2024) 180 capsule 4   No current facility-administered medications for this visit.     Past Surgical History:  Procedure Laterality Date   CARPAL TUNNEL RELEASE Right 06/10/2013   Procedure: CARPAL TUNNEL RELEASE;  Surgeon: Arley JONELLE Curia, MD;  Location: [facility];  Service: Orthopedics;  Laterality: Right;   CHOLECYSTECTOMY N/A 08/19/2021   Procedure: LAPAROSCOPIC CHOLECYSTECTOMY;  Surgeon: Kallie Manuelita BROCKS, MD;  Location: [facility];  Service: General;  Laterality: N/A;   COLONOSCOPY  12/20/2011   [name]-single diminutive sessile polyp in the sigmoid colon/small interenal hemorrhoids   COLONOSCOPY N/A 11/28/2017   Procedure: COLONOSCOPY;  Surgeon: Shaaron Lamar HERO, MD;  Location: [facility];  Service: Endoscopy;  Laterality: N/A;  10:30    COLONOSCOPY WITH PROPOFOL  N/A 07/07/2021   one 9 mm polyp in rectum s/p removal (serrated adenoma). surveillance in 5 years   ESOPHAGOGASTRODUODENOSCOPY  04/10/2003   Normal esophagus/Antral erosions, as described above.  The remainder of the stomach appeared normal.  Normal first and second parts of the duodenum.   ESOPHAGOGASTRODUODENOSCOPY  12/20/2011   [name]-hiatus hernia/chronic gastritis/normal duodenumNEGATIVE H pylori, negative SB biopsy   POLYPECTOMY  07/07/2021   Procedure: POLYPECTOMY;  Surgeon: Shaaron Lamar HERO, MD;  Location: [facility];  Service: Endoscopy;;   TIBIA FRACTURE SURGERY  09/11/2010   right     Allergies  Allergen Reactions   Ace Inhibitors Shortness Of Breath   Beta Adrenergic Blockers Shortness Of Breath   Adhesive [Tape]     Peels skin   Cymbalta [Duloxetine Hcl]     Gums red, mouth felt hout   Methylprednisolone      Redness all over   Prednisone Other (See Comments)    Redness all over   Azithromycin Rash   Latex Rash    Sensitive skin, causes redness    Nabumetone Rash and Other (See Comments)    lips swollen        Family History  Problem Relation Age of Onset   Diabetes Mother    Coronary artery disease Mother    COPD Mother    Colon polyps Mother        age 37   Dementia Mother    Cancer Father    Lung cancer Father    Breast cancer Neg Hx      Social History [name] reports that she quit smoking about 40 years ago. Her smoking use included cigarettes. She has never used smokeless tobacco. [name] reports current alcohol  use.  Physical Examination Today's Vitals   07/02/24 1409 07/02/24 1424  BP: (!) 160/88 (!) 140/70  Pulse: 80   SpO2: 96%   Weight: 210 lb 3.2 oz (95.3 kg)   Height: 5' 3 (1.6 m)    Body mass index is 37.24 kg/m.  Gen: resting comfortably, no acute distress HEENT: no scleral icterus, pupils equal round and reactive, no palptable cervical adenopathy,  CV: RRR, no m/rg, no  jvd Resp: Clear to auscultation bilaterally GI: abdomen is soft, non-tender, non-distended, normal bowel sounds, no hepatosplenomegaly MSK: extremities are warm, no edema.  Skin: warm, no rash Neuro:  no focal deficits Psych: appropriate affect   Diagnostic Studies     Assessment and Plan  1.DOE - initial symptoms were more significant, she reports much improved since stopping metoprolol. Does have history of asthma, perhaps some beta blocker related asthma symptoms -  mild to moderate PASP at 41 mmHg by pcp echo, normal RV function. Suspect related to her OSA which she is not using cpap for. Possible some component of OHS as well - symptoms much improved, no additional testing at this time.    2. HTN -elevated here but at goal at recent neuro visit - submit bp log 1 week, would likely add norvasc 5mg  daily. If needed   3. Pulmonary HTN - mild to moderate PASP at 41 mmHg by pcp echo, normal RV function. Suspect related to her OSA. Possible some component of OHS as well. She had grade I diastolic dysfunction also on echo - will monitor at this tmie.         Dorn PHEBE Ross, M.D

## 2024-07-04 ENCOUNTER — Other Ambulatory Visit (HOSPITAL_BASED_OUTPATIENT_CLINIC_OR_DEPARTMENT_OTHER): Payer: Self-pay

## 2024-07-04 DIAGNOSIS — G4733 Obstructive sleep apnea (adult) (pediatric): Secondary | ICD-10-CM | POA: Diagnosis not present

## 2024-07-04 DIAGNOSIS — F341 Dysthymic disorder: Secondary | ICD-10-CM | POA: Diagnosis not present

## 2024-07-07 ENCOUNTER — Other Ambulatory Visit: Payer: Self-pay

## 2024-07-07 ENCOUNTER — Encounter: Payer: Self-pay | Admitting: Physical Therapy

## 2024-07-07 ENCOUNTER — Ambulatory Visit: Admitting: Physical Therapy

## 2024-07-07 ENCOUNTER — Other Ambulatory Visit

## 2024-07-07 DIAGNOSIS — M542 Cervicalgia: Secondary | ICD-10-CM | POA: Diagnosis not present

## 2024-07-07 DIAGNOSIS — M6281 Muscle weakness (generalized): Secondary | ICD-10-CM

## 2024-07-07 NOTE — Therapy (Signed)
 OUTPATIENT PHYSICAL THERAPY EVALUATION   Patient Name: Marie Farmer MRN: [id number] DOB:1956-08-20, 68 y.o., female Today's Date: 07/07/2024  END OF SESSION:  PT End of Session - 07/07/24 1044     Visit Number 3    Number of Visits 12    Date for Recertification  08/11/24    Progress Note Due on Visit 10    PT Start Time 1022   arrived a few minutes late   PT Stop Time 1058   had to leave a little early to get to exercise class   PT Time Calculation (min) 36 min    Activity Tolerance Patient tolerated treatment well    Behavior During Therapy WFL for tasks assessed/performed           Past Medical History:  Diagnosis Date   Anxiety    Asthma    DDD (degenerative disc disease)    chronic back pain   Fibromyalgia    Foot pain    GERD (gastroesophageal reflux disease)    Hypertension    IBS (irritable bowel syndrome)    Mood disorder    PONV (postoperative nausea and vomiting)    Sleep apnea    Venous insufficiency    Wears glasses    Past Surgical History:  Procedure Laterality Date   CARPAL TUNNEL RELEASE Right 06/10/2013   Procedure: CARPAL TUNNEL RELEASE;  Surgeon: Arley JONELLE Curia, MD;  Location: [facility];  Service: Orthopedics;  Laterality: Right;   CHOLECYSTECTOMY N/A 08/19/2021   Procedure: LAPAROSCOPIC CHOLECYSTECTOMY;  Surgeon: Kallie Manuelita BROCKS, MD;  Location: [facility];  Service: General;  Laterality: N/A;   COLONOSCOPY  12/20/2011   [name]-single diminutive sessile polyp in the sigmoid colon/small interenal hemorrhoids   COLONOSCOPY N/A 11/28/2017   Procedure: COLONOSCOPY;  Surgeon: Shaaron Lamar HERO, MD;  Location: [facility];  Service: Endoscopy;  Laterality: N/A;  10:30   COLONOSCOPY WITH PROPOFOL  N/A 07/07/2021   one 9 mm polyp in rectum s/p removal (serrated adenoma). surveillance in 5 years   ESOPHAGOGASTRODUODENOSCOPY  04/10/2003   Normal esophagus/Antral erosions, as described above.  The remainder of the stomach appeared  normal.  Normal first and second parts of the duodenum.   ESOPHAGOGASTRODUODENOSCOPY  12/20/2011   [name]-hiatus hernia/chronic gastritis/normal duodenumNEGATIVE H pylori, negative SB biopsy   POLYPECTOMY  07/07/2021   Procedure: POLYPECTOMY;  Surgeon: Shaaron Lamar HERO, MD;  Location: [facility];  Service: Endoscopy;;   TIBIA FRACTURE SURGERY  09/11/2010   right   Patient Active Problem List   Diagnosis Date Noted   MCI (mild cognitive impairment) 06/05/2024   Dyslexia 06/05/2024   Family history of dementia 06/05/2024   Internal hemorrhoids 07/20/2022   Anal fissure 07/20/2022   Bunion 12/19/2021   DDD (degenerative disc disease), lumbar 12/12/2021   Fibromyalgia 12/12/2021   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   Other specified nontoxic goiter 12/12/2021   Anemia of chronic disease 08/02/2021   Ankle impingement syndrome, left 03/25/2020   Osteochondritis dissecans of talus, left 03/25/2020   Instability of left ankle joint 02/03/2020   Plantar fasciitis 04/18/2018   Aphthous ulcer of mouth 04/02/2018   Difficulty sleeping 04/02/2018   Under emotional stress 04/02/2018   OSA on CPAP 02/01/2018   Dyspnea on exertion 10/16/2016   Essential hypertension 10/16/2016   Morbid obesity due to excess calories (HCC) 10/16/2016   IBS (irritable bowel syndrome) 02/29/2012   Tibial plateau fracture 05/10/2011   Helicobacter pylori infection 08/25/2009  DEPRESSION 08/25/2009   Asthma 08/25/2009   GASTROESOPHAGEAL REFLUX DISEASE, CHRONIC 08/25/2009   Allergy  08/25/2009   Cholelithiasis 08/25/2009    PCP: Rosamond Leta NOVAK, MD   REFERRING PROVIDER: Orpha Stabs  REFERRING DIAG: F45.87 Cervical radiculopathy  Rationale for Evaluation and Treatment:  Rehabiliation  THERAPY DIAG:  Cervicalgia  Muscle weakness (generalized)  ONSET DATE: 5 years of neck pain   SUBJECTIVE:                                                                                                                                                                                            SUBJECTIVE STATEMENT:  Having pain down the back today too not just the neck, its not new   PERTINENT HISTORY:  See above PMH  PAIN:  NPRS scale: 6/10 upon arrival Pain location:Base of neck and upper back, obliques and hip  Pain description: sort of sharp  Aggravating factors: using pillow, pressure on her neck, sewing Relieving factors: laying down without a pillow, traction for neck nothing for neck pain    PRECAUTIONS: ,  None  RED FLAGS: None   WEIGHT BEARING RESTRICTIONS:  No  FALLS:  Has patient fallen in last 6 months? No   PLOF:  Independent  PATIENT GOALS:  Reduce pain  OBJECTIVE:  Note: Objective measures were completed at Evaluation unless otherwise noted.  DIAGNOSTIC FINDINGS:  Has MRI scheduled 07/10/24  PATIENT SURVEYS:  Patient-Specific Activity Scoring Scheme  0 represents "unable to perform." 10 represents "able to perform at prior level. 0 1 2 3 4 5 6 7 8 9  10 (Date and Score)   Activity Eval     1. Reaching overhead 5    2. Exercise classes 5    3.     4.    5.    Score 5/10    Total score = sum of the activity scores/number of activities Minimum detectable change (90%CI) for average score = 2 points Minimum detectable change (90%CI) for single activity score = 3 points     EDEMA:  No   CERVICAL ROM:   Active ROM AROM (deg) eval  Flexion 20  Extension 30  Right lateral flexion 40  Left lateral flexion 40  Right rotation 70  Left rotation 70   (Blank rows = not tested)   UPPER EXTREMITY MMT:  MMT Right eval Left eval  Shoulder flexion 5 5  Shoulder extension    Shoulder abduction 5 5  Shoulder adduction    Shoulder extension    Shoulder internal rotation 5 5  Shoulder external rotation 4 4  Middle trapezius  Lower trapezius    Elbow flexion    Elbow extension    Wrist flexion     Wrist extension    Wrist ulnar deviation    Wrist radial deviation    Wrist pronation    Wrist supination    Grip strength     (Blank rows = not tested)                                                                                                                           TREATMENT DATE:   07/07/24  UBE L4x3 min forward/3 min backward   Thoracic arching at mat table10x5 seconds  Lumbar flexion stretch 3x15 seconds    Cervical traction 18-13# intermittent x15 min    07/02/24 HEP review with demonstration and trial set preformed, see below for details Seated cervical retractions X 10 Seated levator strech 30 sec X 3 bilat Seated upper trap stretch 30 sec X 3 bilat Seated thoracic extensions 5 sec X 10 Seated neck rotation stretch AAROM with towel 5 sec X 10 bilat Standing left shoulder ER with red 2X10 Standing left shoulder IR with red 2X10 Mechanical cervical traction 18-13# intermittent X 20 min    PATIENT EDUCATION: Education details: HEP, PT plan of care,  Person educated: Patient Education method: Explanation, Demonstration, Verbal cues, and Handouts Education comprehension: verbalized understanding, further education recommended   HOME EXERCISE PROGRAM:  Access Code: [email or web address] URL: [email or web address] Date: 07/07/2024 Prepared by: Josette Rough  Exercises - Seated Cervical Retraction  - 2 x daily - 6 x weekly - 2 sets - 10 reps - Seated Levator Scapulae Stretch  - 2 x daily - 6 x weekly - 1 sets - 3 reps - 30 sec hold - Seated Cervical Sidebending Stretch (Mirrored)  - 2 x daily - 6 x weekly - 1 sets - 3 reps - 30 sec hold - Seated Thoracic Self Mobilization  - 1 x daily - 7 x weekly - 1 sets - 10 reps - 5 sec hold - Seated Assisted Cervical Rotation with Towel  - 2 x daily - 6 x weekly - 1 sets - 10 reps - 5 sec hold - Shoulder External Rotation with Anchored Resistance  - 2 x daily - 6 x weekly - 2 sets - 15 reps - Shoulder Internal  Rotation with Resistance (Mirrored)  - 2 x daily - 6 x weekly - 2 sets - 15 reps - Seated Flexion Stretch  - 2 x daily - 7 x weekly - 1 sets - 3 reps - 30 seconds  hold - Modified Push Up on Knees with Thoracic Arch  - 2 x daily - 7 x weekly - 1 sets - 10 reps - 5 seconds  hold  ASSESSMENT:  CLINICAL IMPRESSION:   Requested traction again today, we did this again but I also recommended home traction unit so she can do this for herself at home and we can progress interventions here. Otherwise worked  on more gross thoracic and lumbar stretches today as she had quite a bit of pain in other parts of her back. She did somewhat dictate session today. Will progress as able.   Per eval: Patient referred to PT for cervical radiculopathy. She may also have some left RTC injury as she is weak with external rotation in this arm and gets pain reaching overhead. Patient will benefit from skilled PT to improve overall function and to address impairments and limitations listed below.  OBJECTIVE IMPAIRMENTS: decreased activity tolerance for ADL's, decreased ROM, decreased strength, impaired flexibility, impaired UE use, and pain.  ACTIVITY LIMITATIONS: exercise classes, cleaning, community activity, driving, reaching, carry PERSONAL FACTORS: see above PMH  also affecting patient's functional outcome.  REHAB POTENTIAL: Good  CLINICAL DECISION MAKING: Evolving/moderate complexity  EVALUATION COMPLEXITY: Moderate    GOALS: Short term PT Goals Target date: 07/28/2024   Pt will be I and compliant with HEP. Baseline:  Goal status: New Pt will decrease pain by 25% overall Baseline: Goal status: New  Long term PT goals Target date:08/11/2024   Pt will improve neck ROM to [facility] to improve functional mobility Baseline: Goal status: New Pt will improve left shoulder strength to at least 4+/5 MMT to improve functional strength Baseline: Goal status: New Pt will improve Patient specific functional scale  (PSFS) to at least 7/10 to show improved function level Baseline: Goal status: New Pt will reduce pain to overall less than 3/10 with usual activity and work activity. Baseline: Goal status: New  PLAN: PT FREQUENCY: 1-2 times per week   PT DURATION: 4-6 weeks  PLANNED INTERVENTIONS  97110-Therapeutic exercises, 97530- Therapeutic activity, [id number]- Neuromuscular re-education, 97535- Self Care, 02859- Manual therapy, G0283- Electrical stimulation (unattended), [id number]- Ultrasound, 02987- Traction (mechanical), and 97033- Ionotophoresis 4mg /ml Dexamethasone   PLAN FOR NEXT SESSION: traction if desired.  She will have MRI 10/30 so look for results, add in RTC exercises as neck pain calms down some.  Did she get home cervical traction unit?   NEXT MD VISIT: ?  Magda Josette BRAVO, PT,DPT 07/07/2024, 11:00 AM

## 2024-07-08 ENCOUNTER — Other Ambulatory Visit (HOSPITAL_COMMUNITY): Payer: Self-pay

## 2024-07-08 ENCOUNTER — Other Ambulatory Visit: Payer: Self-pay

## 2024-07-09 ENCOUNTER — Ambulatory Visit: Admitting: Neurology

## 2024-07-09 ENCOUNTER — Encounter: Payer: Self-pay | Admitting: Pharmacist

## 2024-07-09 ENCOUNTER — Other Ambulatory Visit: Payer: Self-pay

## 2024-07-09 DIAGNOSIS — R4182 Altered mental status, unspecified: Secondary | ICD-10-CM

## 2024-07-09 DIAGNOSIS — Z818 Family history of other mental and behavioral disorders: Secondary | ICD-10-CM

## 2024-07-09 DIAGNOSIS — G3184 Mild cognitive impairment, so stated: Secondary | ICD-10-CM

## 2024-07-09 DIAGNOSIS — R480 Dyslexia and alexia: Secondary | ICD-10-CM

## 2024-07-10 ENCOUNTER — Ambulatory Visit
Admission: RE | Admit: 2024-07-10 | Discharge: 2024-07-10 | Disposition: A | Discharge status: Home / Self Care | Source: Ambulatory Visit | Attending: Sports Medicine | Admitting: Sports Medicine

## 2024-07-10 ENCOUNTER — Other Ambulatory Visit

## 2024-07-10 ENCOUNTER — Ambulatory Visit
Admission: RE | Admit: 2024-07-10 | Discharge: 2024-07-10 | Disposition: A | Discharge status: Home / Self Care | Source: Ambulatory Visit | Attending: Neurology | Admitting: Neurology

## 2024-07-10 DIAGNOSIS — M4802 Spinal stenosis, cervical region: Secondary | ICD-10-CM | POA: Diagnosis not present

## 2024-07-10 DIAGNOSIS — R480 Dyslexia and alexia: Secondary | ICD-10-CM

## 2024-07-10 DIAGNOSIS — G3184 Mild cognitive impairment, so stated: Secondary | ICD-10-CM

## 2024-07-10 DIAGNOSIS — M50221 Other cervical disc displacement at C4-C5 level: Secondary | ICD-10-CM | POA: Diagnosis not present

## 2024-07-10 DIAGNOSIS — Z818 Family history of other mental and behavioral disorders: Secondary | ICD-10-CM

## 2024-07-10 DIAGNOSIS — M542 Cervicalgia: Secondary | ICD-10-CM

## 2024-07-10 DIAGNOSIS — M47812 Spondylosis without myelopathy or radiculopathy, cervical region: Secondary | ICD-10-CM | POA: Diagnosis not present

## 2024-07-10 MED ORDER — GADOPICLENOL 0.5 MMOL/ML IV SOLN
10.0000 mL | Freq: Once | INTRAVENOUS | Status: AC | PRN
  Administered 2024-07-10: 10 mL via INTRAVENOUS

## 2024-07-11 ENCOUNTER — Encounter: Payer: Self-pay | Admitting: Physical Therapy

## 2024-07-11 ENCOUNTER — Ambulatory Visit: Admitting: Physical Therapy

## 2024-07-11 DIAGNOSIS — M542 Cervicalgia: Secondary | ICD-10-CM

## 2024-07-11 DIAGNOSIS — M6281 Muscle weakness (generalized): Secondary | ICD-10-CM

## 2024-07-11 NOTE — Therapy (Signed)
 OUTPATIENT PHYSICAL THERAPY TREATMENT   Patient Name: Marie Farmer MRN: [id number] DOB:07/28/1956, 68 y.o., female Today's Date: 07/11/2024  END OF SESSION:  PT End of Session - 07/11/24 1031     Visit Number 4    Number of Visits 12    Date for Recertification  08/11/24    Progress Note Due on Visit 10    PT Start Time 1015    PT Stop Time 1100    PT Time Calculation (min) 45 min    Activity Tolerance Patient tolerated treatment well    Behavior During Therapy WFL for tasks assessed/performed           Past Medical History:  Diagnosis Date   Anxiety    Asthma    DDD (degenerative disc disease)    chronic back pain   Fibromyalgia    Foot pain    GERD (gastroesophageal reflux disease)    Hypertension    IBS (irritable bowel syndrome)    Mood disorder    PONV (postoperative nausea and vomiting)    Sleep apnea    Venous insufficiency    Wears glasses    Past Surgical History:  Procedure Laterality Date   CARPAL TUNNEL RELEASE Right 06/10/2013   Procedure: CARPAL TUNNEL RELEASE;  Surgeon: Arley JONELLE Curia, MD;  Location: [facility];  Service: Orthopedics;  Laterality: Right;   CHOLECYSTECTOMY N/A 08/19/2021   Procedure: LAPAROSCOPIC CHOLECYSTECTOMY;  Surgeon: Kallie Manuelita BROCKS, MD;  Location: [facility];  Service: General;  Laterality: N/A;   COLONOSCOPY  12/20/2011   [name]-single diminutive sessile polyp in the sigmoid colon/small interenal hemorrhoids   COLONOSCOPY N/A 11/28/2017   Procedure: COLONOSCOPY;  Surgeon: Shaaron Lamar HERO, MD;  Location: [facility];  Service: Endoscopy;  Laterality: N/A;  10:30   COLONOSCOPY WITH PROPOFOL  N/A 07/07/2021   one 9 mm polyp in rectum s/p removal (serrated adenoma). surveillance in 5 years   ESOPHAGOGASTRODUODENOSCOPY  04/10/2003   Normal esophagus/Antral erosions, as described above.  The remainder of the stomach appeared normal.  Normal first and second parts of the duodenum.   ESOPHAGOGASTRODUODENOSCOPY   12/20/2011   [name]-hiatus hernia/chronic gastritis/normal duodenumNEGATIVE H pylori, negative SB biopsy   POLYPECTOMY  07/07/2021   Procedure: POLYPECTOMY;  Surgeon: Shaaron Lamar HERO, MD;  Location: [facility];  Service: Endoscopy;;   TIBIA FRACTURE SURGERY  09/11/2010   right   Patient Active Problem List   Diagnosis Date Noted   MCI (mild cognitive impairment) 06/05/2024   Dyslexia 06/05/2024   Family history of dementia 06/05/2024   Internal hemorrhoids 07/20/2022   Anal fissure 07/20/2022   Bunion 12/19/2021   DDD (degenerative disc disease), lumbar 12/12/2021   Fibromyalgia 12/12/2021   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   Other specified nontoxic goiter 12/12/2021   Anemia of chronic disease 08/02/2021   Ankle impingement syndrome, left 03/25/2020   Osteochondritis dissecans of talus, left 03/25/2020   Instability of left ankle joint 02/03/2020   Plantar fasciitis 04/18/2018   Aphthous ulcer of mouth 04/02/2018   Difficulty sleeping 04/02/2018   Under emotional stress 04/02/2018   OSA on CPAP 02/01/2018   Dyspnea on exertion 10/16/2016   Essential hypertension 10/16/2016   Morbid obesity due to excess calories (HCC) 10/16/2016   IBS (irritable bowel syndrome) 02/29/2012   Tibial plateau fracture 05/10/2011   Helicobacter pylori infection 08/25/2009   DEPRESSION 08/25/2009   Asthma 08/25/2009   GASTROESOPHAGEAL REFLUX DISEASE, CHRONIC 08/25/2009   Allergy   08/25/2009   Cholelithiasis 08/25/2009    PCP: Rosamond Leta NOVAK, MD   REFERRING PROVIDER: Orpha Stabs  REFERRING DIAG: F45.87 Cervical radiculopathy  Rationale for Evaluation and Treatment:  Rehabiliation  THERAPY DIAG:  Cervicalgia  Muscle weakness (generalized)  ONSET DATE: 5 years of neck pain   SUBJECTIVE:                                                                                                                                                                                            SUBJECTIVE STATEMENT:  Having pain down the back and sides today. She has MRI yesterday.  PERTINENT HISTORY:  See above PMH  PAIN:  NPRS scale: 5/10 upon arrival Pain location:Base of neck and upper back, obliques and hip  Pain description: sort of sharp  Aggravating factors: using pillow, pressure on her neck, sewing Relieving factors: laying down without a pillow, traction for neck nothing for neck pain    PRECAUTIONS: ,  None  RED FLAGS: None   WEIGHT BEARING RESTRICTIONS:  No  FALLS:  Has patient fallen in last 6 months? No   PLOF:  Independent  PATIENT GOALS:  Reduce pain  OBJECTIVE:  Note: Objective measures were completed at Evaluation unless otherwise noted.  DIAGNOSTIC FINDINGS:  Neck MRI 07/10/24 IMPRESSION: 1. Right paracentral disc protrusion at C4-5 with resultant mild spinal stenosis. 2. Degenerative disc osteophyte at C6-7 with resultant moderate left C7 foraminal stenosis. 3. Disc bulging with right paracentral to foraminal disc protrusions at T1-2 and T2-3 with resultant mild right foraminal stenosis.  PATIENT SURVEYS:  Patient-Specific Activity Scoring Scheme  0 represents "unable to perform." 10 represents "able to perform at prior level. 0 1 2 3 4 5 6 7 8 9  10 (Date and Score)   Activity Eval     1. Reaching overhead 5    2. Exercise classes 5    3.     4.    5.    Score 5/10    Total score = sum of the activity scores/number of activities Minimum detectable change (90%CI) for average score = 2 points Minimum detectable change (90%CI) for single activity score = 3 points     EDEMA:  No   CERVICAL ROM:   Active ROM AROM (deg) eval  Flexion 20  Extension 30  Right lateral flexion 40  Left lateral flexion 40  Right rotation 70  Left rotation 70   (Blank rows = not tested)   UPPER EXTREMITY MMT:  MMT Right eval Left eval  Shoulder flexion 5 5  Shoulder extension     Shoulder abduction 5 5  Shoulder adduction    Shoulder extension    Shoulder internal rotation 5 5  Shoulder external rotation 4 4  Middle trapezius    Lower trapezius    Elbow flexion    Elbow extension    Wrist flexion    Wrist extension    Wrist ulnar deviation    Wrist radial deviation    Wrist pronation    Wrist supination    Grip strength     (Blank rows = not tested)                                                                                                                           TREATMENT DATE:  07/11/24 Seated thoracic extensions 5 sec X 10 Seated cervical retractions X 10, hold 5 sec Neck isometrics 5 sec X 10 each for lateral sidebending bilat, flexion, extension Seated upper trap stretch 30 sec X 3 bilat Seated neck rotation stretch AAROM with towel 5 sec X 10 bilat HEP revision with education and new print out provided Mechanical cervical traction 19-14# intermittent X 15 min  07/07/24  UBE L4x3 min forward/3 min backward   Thoracic arching at mat table10x5 seconds  Lumbar flexion stretch 3x15 seconds    Cervical traction 18-13# intermittent x15 min   PATIENT EDUCATION: Education details: HEP, PT plan of care,  Person educated: Patient Education method: Explanation, Demonstration, Verbal cues, and Handouts Education comprehension: verbalized understanding, further education recommended   HOME EXERCISE PROGRAM:  Access Code: [email or web address] URL: [email or web address] Date: 07/11/2024 Prepared by: Redell Moose  Exercises - Seated Thoracic Self Mobilization  - 5 x daily - 7 x weekly - 1 sets - 10 reps - 5 sec hold - Seated Cervical Retraction  - 5 x daily - 6 x weekly - 2 sets - 10 reps - Seated Cervical Sidebending Stretch (Mirrored)  - 1 x daily - 6 x weekly - 1 sets - 3 reps - 30 sec hold - Seated Assisted Cervical Rotation with Towel  - 1 x daily - 6 x weekly - 1 sets - 10 reps - 5 sec hold - Standing Isometric Cervical Flexion  with Manual Resistance  - 1 x daily - 6 x weekly - 3 sets - 10 reps - Standing Isometric Cervical Sidebending with Manual Resistance  - 1 x daily - 6 x weekly - 1 sets - 10 reps - 6 hold - Standing Isometric Cervical Extension with Manual Resistance  - 1 x daily - 6 x weekly - 1 sets - 10 reps - 5 hold - Shoulder External Rotation with Anchored Resistance  - 1 x daily - 3 x weekly - 2 sets - 15 reps - Shoulder Internal Rotation with Resistance (Mirrored)  - 1 x daily - 3 x weekly - 2 sets - 15 reps  ASSESSMENT:  CLINICAL IMPRESSION:  MRI showing multiple disc protrusions in neck and thoracic, see above for details. I changed up her home program for more mckenzie/extension  focus as well as adding in neck isometrics.   Per eval: Patient referred to PT for cervical radiculopathy. She may also have some left RTC injury as she is weak with external rotation in this arm and gets pain reaching overhead. Patient will benefit from skilled PT to improve overall function and to address impairments and limitations listed below.  OBJECTIVE IMPAIRMENTS: decreased activity tolerance for ADL's, decreased ROM, decreased strength, impaired flexibility, impaired UE use, and pain.  ACTIVITY LIMITATIONS: exercise classes, cleaning, community activity, driving, reaching, carry PERSONAL FACTORS: see above PMH  also affecting patient's functional outcome.  REHAB POTENTIAL: Good  CLINICAL DECISION MAKING: Evolving/moderate complexity  EVALUATION COMPLEXITY: Moderate    GOALS: Short term PT Goals Target date: 07/28/2024   Pt will be I and compliant with HEP. Baseline:  Goal status: New Pt will decrease pain by 25% overall Baseline: Goal status: New  Long term PT goals Target date:08/11/2024   Pt will improve neck ROM to [facility] to improve functional mobility Baseline: Goal status: New Pt will improve left shoulder strength to at least 4+/5 MMT to improve functional strength Baseline: Goal status:  New Pt will improve Patient specific functional scale (PSFS) to at least 7/10 to show improved function level Baseline: Goal status: New Pt will reduce pain to overall less than 3/10 with usual activity and work activity. Baseline: Goal status: New  PLAN: PT FREQUENCY: 1-2 times per week   PT DURATION: 4-6 weeks  PLANNED INTERVENTIONS  97110-Therapeutic exercises, 97530- Therapeutic activity, [id number]- Neuromuscular re-education, 97535- Self Care, 02859- Manual therapy, G0283- Electrical stimulation (unattended), [id number]- Ultrasound, 02987- Traction (mechanical), and 02966- Ionotophoresis 4mg /ml Dexamethasone   PLAN FOR NEXT SESSION: traction and mckenezie program.   NEXT MD VISIT:   Redell JONELLE Moose, PT,DPT 07/11/2024, 11:00 AM

## 2024-07-14 ENCOUNTER — Encounter: Payer: Self-pay | Admitting: Radiology

## 2024-07-14 ENCOUNTER — Encounter: Payer: Self-pay | Admitting: Psychology

## 2024-07-14 DIAGNOSIS — M5412 Radiculopathy, cervical region: Secondary | ICD-10-CM | POA: Diagnosis not present

## 2024-07-14 DIAGNOSIS — M4802 Spinal stenosis, cervical region: Secondary | ICD-10-CM | POA: Diagnosis not present

## 2024-07-14 NOTE — Progress Notes (Signed)
 Result seen by patient in mychart.

## 2024-07-16 ENCOUNTER — Encounter: Payer: Self-pay | Admitting: Physical Therapy

## 2024-07-16 ENCOUNTER — Ambulatory Visit: Attending: Podiatry | Admitting: Physical Therapy

## 2024-07-16 ENCOUNTER — Other Ambulatory Visit: Payer: Self-pay

## 2024-07-16 DIAGNOSIS — M6281 Muscle weakness (generalized): Secondary | ICD-10-CM | POA: Insufficient documentation

## 2024-07-16 DIAGNOSIS — M542 Cervicalgia: Secondary | ICD-10-CM | POA: Insufficient documentation

## 2024-07-16 NOTE — Therapy (Signed)
 OUTPATIENT PHYSICAL THERAPY TREATMENT   Patient Name: Marie Farmer MRN: [id number] DOB:11-27-1955, 68 y.o., female Today's Date: 07/16/2024  END OF SESSION:  PT End of Session - 07/16/24 0925     Visit Number 5    Number of Visits 12    Date for Recertification  08/11/24    Progress Note Due on Visit 10    PT Start Time 0920    PT Stop Time 1005    PT Time Calculation (min) 45 min    Activity Tolerance Patient tolerated treatment well    Behavior During Therapy WFL for tasks assessed/performed           Past Medical History:  Diagnosis Date   Anxiety    Asthma    DDD (degenerative disc disease)    chronic back pain   Fibromyalgia    Foot pain    GERD (gastroesophageal reflux disease)    Hypertension    IBS (irritable bowel syndrome)    Mood disorder    PONV (postoperative nausea and vomiting)    Sleep apnea    Venous insufficiency    Wears glasses    Past Surgical History:  Procedure Laterality Date   CARPAL TUNNEL RELEASE Right 06/10/2013   Procedure: CARPAL TUNNEL RELEASE;  Surgeon: Arley JONELLE Curia, MD;  Location: [facility];  Service: Orthopedics;  Laterality: Right;   CHOLECYSTECTOMY N/A 08/19/2021   Procedure: LAPAROSCOPIC CHOLECYSTECTOMY;  Surgeon: Kallie Manuelita BROCKS, MD;  Location: [facility];  Service: General;  Laterality: N/A;   COLONOSCOPY  12/20/2011   [name]-single diminutive sessile polyp in the sigmoid colon/small interenal hemorrhoids   COLONOSCOPY N/A 11/28/2017   Procedure: COLONOSCOPY;  Surgeon: Shaaron Lamar HERO, MD;  Location: [facility];  Service: Endoscopy;  Laterality: N/A;  10:30   COLONOSCOPY WITH PROPOFOL  N/A 07/07/2021   one 9 mm polyp in rectum s/p removal (serrated adenoma). surveillance in 5 years   ESOPHAGOGASTRODUODENOSCOPY  04/10/2003   Normal esophagus/Antral erosions, as described above.  The remainder of the stomach appeared normal.  Normal first and second parts of the duodenum.   ESOPHAGOGASTRODUODENOSCOPY   12/20/2011   [name]-hiatus hernia/chronic gastritis/normal duodenumNEGATIVE H pylori, negative SB biopsy   POLYPECTOMY  07/07/2021   Procedure: POLYPECTOMY;  Surgeon: Shaaron Lamar HERO, MD;  Location: [facility];  Service: Endoscopy;;   TIBIA FRACTURE SURGERY  09/11/2010   right   Patient Active Problem List   Diagnosis Date Noted   MCI (mild cognitive impairment) 06/05/2024   Dyslexia 06/05/2024   Family history of dementia 06/05/2024   Internal hemorrhoids 07/20/2022   Anal fissure 07/20/2022   Bunion 12/19/2021   DDD (degenerative disc disease), lumbar 12/12/2021   Fibromyalgia 12/12/2021   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   Other specified nontoxic goiter 12/12/2021   Anemia of chronic disease 08/02/2021   Ankle impingement syndrome, left 03/25/2020   Osteochondritis dissecans of talus, left 03/25/2020   Instability of left ankle joint 02/03/2020   Plantar fasciitis 04/18/2018   Aphthous ulcer of mouth 04/02/2018   Difficulty sleeping 04/02/2018   Under emotional stress 04/02/2018   OSA on CPAP 02/01/2018   Dyspnea on exertion 10/16/2016   Essential hypertension 10/16/2016   Morbid obesity due to excess calories (HCC) 10/16/2016   IBS (irritable bowel syndrome) 02/29/2012   Tibial plateau fracture 05/10/2011   Helicobacter pylori infection 08/25/2009   DEPRESSION 08/25/2009   Asthma 08/25/2009   GASTROESOPHAGEAL REFLUX DISEASE, CHRONIC 08/25/2009   Allergy   08/25/2009   Cholelithiasis 08/25/2009    PCP: Rosamond Leta NOVAK, MD   REFERRING PROVIDER: Orpha Stabs  REFERRING DIAG: F45.87 Cervical radiculopathy  Rationale for Evaluation and Treatment:  Rehabiliation  THERAPY DIAG:  Cervicalgia  Muscle weakness (generalized)  ONSET DATE: 5 years of neck pain   SUBJECTIVE:                                                                                                                                                                                            SUBJECTIVE STATEMENT: She met with doctor about neck MRI results and neck injection was recommended for her.  PERTINENT HISTORY:  See above PMH  PAIN:  NPRS scale: 1/10 upon arrival but took pain medicine. Pain location:Base of neck and upper back, obliques and hip  Pain description: sort of sharp  Aggravating factors: using pillow, pressure on her neck, sewing Relieving factors: laying down without a pillow, traction for neck nothing for neck pain    PRECAUTIONS: ,  None  RED FLAGS: None   WEIGHT BEARING RESTRICTIONS:  No  FALLS:  Has patient fallen in last 6 months? No   PLOF:  Independent  PATIENT GOALS:  Reduce pain  OBJECTIVE:  Note: Objective measures were completed at Evaluation unless otherwise noted.  DIAGNOSTIC FINDINGS:  Neck MRI 07/10/24 IMPRESSION: 1. Right paracentral disc protrusion at C4-5 with resultant mild spinal stenosis. 2. Degenerative disc osteophyte at C6-7 with resultant moderate left C7 foraminal stenosis. 3. Disc bulging with right paracentral to foraminal disc protrusions at T1-2 and T2-3 with resultant mild right foraminal stenosis.  PATIENT SURVEYS:  Patient-Specific Activity Scoring Scheme  0 represents "unable to perform." 10 represents "able to perform at prior level. 0 1 2 3 4 5 6 7 8 9  10 (Date and Score)   Activity Eval     1. Reaching overhead 5    2. Exercise classes 5    3.     4.    5.    Score 5/10    Total score = sum of the activity scores/number of activities Minimum detectable change (90%CI) for average score = 2 points Minimum detectable change (90%CI) for single activity score = 3 points     EDEMA:  No   CERVICAL ROM:   Active ROM AROM (deg) eval  Flexion 20  Extension 30  Right lateral flexion 40  Left lateral flexion 40  Right rotation 70  Left rotation 70   (Blank rows = not tested)   UPPER EXTREMITY MMT:  MMT Right eval Left eval  Shoulder  flexion 5 5  Shoulder extension  Shoulder abduction 5 5  Shoulder adduction    Shoulder extension    Shoulder internal rotation 5 5  Shoulder external rotation 4 4  Middle trapezius    Lower trapezius    Elbow flexion    Elbow extension    Wrist flexion    Wrist extension    Wrist ulnar deviation    Wrist radial deviation    Wrist pronation    Wrist supination    Grip strength     (Blank rows = not tested)                                                                                                                           TREATMENT DATE:  07/16/24 Seated thoracic extensions 5 sec X 10 Seated cervical retractions X 10, hold 5 sec Neck isometrics 5 sec X 10 each for lateral sidebending bilat, flexion, extension Seated upper trap stretch 30 sec X 3 bilat Seated neck rotation stretch AAROM with towel 5 sec X 10 bilat Scapular retractions 5 sec X 10 Showed her home cervical traction units Mechanical cervical traction 20-15# intermittent X 15 min   PATIENT EDUCATION: Education details: HEP, PT plan of care,  Person educated: Patient Education method: Explanation, Demonstration, Verbal cues, and Handouts Education comprehension: verbalized understanding, further education recommended   HOME EXERCISE PROGRAM:  Access Code: [email or web address] URL: [email or web address] Date: 07/11/2024 Prepared by: Redell Moose  Exercises - Seated Thoracic Self Mobilization  - 5 x daily - 7 x weekly - 1 sets - 10 reps - 5 sec hold - Seated Cervical Retraction  - 5 x daily - 6 x weekly - 2 sets - 10 reps - Seated Cervical Sidebending Stretch (Mirrored)  - 1 x daily - 6 x weekly - 1 sets - 3 reps - 30 sec hold - Seated Assisted Cervical Rotation with Towel  - 1 x daily - 6 x weekly - 1 sets - 10 reps - 5 sec hold - Standing Isometric Cervical Flexion with Manual Resistance  - 1 x daily - 6 x weekly - 3 sets - 10 reps - Standing Isometric Cervical Sidebending with Manual Resistance  -  1 x daily - 6 x weekly - 1 sets - 10 reps - 6 hold - Standing Isometric Cervical Extension with Manual Resistance  - 1 x daily - 6 x weekly - 1 sets - 10 reps - 5 hold - Shoulder External Rotation with Anchored Resistance  - 1 x daily - 3 x weekly - 2 sets - 15 reps - Shoulder Internal Rotation with Resistance (Mirrored)  - 1 x daily - 3 x weekly - 2 sets - 15 reps  ASSESSMENT:  CLINICAL IMPRESSION:  Neck injection has been recommended for her, does not yet have this scheduled. I continued with tractions and mckenzie program in efforts to reduce cervical radiculopathy. I also showed her some home cervical traction systems that would be beneficial for her as well.   Per  eval: Patient referred to PT for cervical radiculopathy. She may also have some left RTC injury as she is weak with external rotation in this arm and gets pain reaching overhead. Patient will benefit from skilled PT to improve overall function and to address impairments and limitations listed below.  OBJECTIVE IMPAIRMENTS: decreased activity tolerance for ADL's, decreased ROM, decreased strength, impaired flexibility, impaired UE use, and pain.  ACTIVITY LIMITATIONS: exercise classes, cleaning, community activity, driving, reaching, carry PERSONAL FACTORS: see above PMH  also affecting patient's functional outcome.  REHAB POTENTIAL: Good  CLINICAL DECISION MAKING: Evolving/moderate complexity  EVALUATION COMPLEXITY: Moderate    GOALS: Short term PT Goals Target date: 07/28/2024   Pt will be I and compliant with HEP. Baseline:  Goal status: New Pt will decrease pain by 25% overall Baseline: Goal status: New  Long term PT goals Target date:08/11/2024   Pt will improve neck ROM to [facility] to improve functional mobility Baseline: Goal status: New Pt will improve left shoulder strength to at least 4+/5 MMT to improve functional strength Baseline: Goal status: New Pt will improve Patient specific functional scale  (PSFS) to at least 7/10 to show improved function level Baseline: Goal status: New Pt will reduce pain to overall less than 3/10 with usual activity and work activity. Baseline: Goal status: New  PLAN: PT FREQUENCY: 1-2 times per week   PT DURATION: 4-6 weeks  PLANNED INTERVENTIONS  97110-Therapeutic exercises, 97530- Therapeutic activity, [id number]- Neuromuscular re-education, 97535- Self Care, 02859- Manual therapy, G0283- Electrical stimulation (unattended), [id number]- Ultrasound, 02987- Traction (mechanical), and 02966- Ionotophoresis 4mg /ml Dexamethasone   PLAN FOR NEXT SESSION: traction and mckenezie program.   NEXT MD VISIT:   Redell JONELLE Moose, PT,DPT 07/16/2024, 9:53 AM

## 2024-07-18 ENCOUNTER — Other Ambulatory Visit (HOSPITAL_COMMUNITY): Payer: Self-pay

## 2024-07-18 MED ORDER — GABAPENTIN 600 MG PO TABS
600.0000 mg | ORAL_TABLET | Freq: Three times a day (TID) | ORAL | 3 refills | Status: AC
  Filled 2024-07-21 – 2024-07-22 (×2): qty 90, 30d supply, fill #0
  Filled 2024-08-19: qty 90, 30d supply, fill #1
  Filled ????-??-??: fill #1

## 2024-07-18 MED ORDER — VALSARTAN-HYDROCHLOROTHIAZIDE 320-25 MG PO TABS
1.0000 | ORAL_TABLET | Freq: Every day | ORAL | 4 refills | Status: AC
  Filled 2024-07-21 – 2024-07-22 (×2): qty 30, 30d supply, fill #0
  Filled 2024-08-19 – 2024-08-20 (×2): qty 30, 30d supply, fill #1
  Filled ????-??-??: fill #1

## 2024-07-21 ENCOUNTER — Other Ambulatory Visit (HOSPITAL_BASED_OUTPATIENT_CLINIC_OR_DEPARTMENT_OTHER): Payer: Self-pay

## 2024-07-21 ENCOUNTER — Other Ambulatory Visit: Payer: Self-pay

## 2024-07-21 MED ORDER — FAMOTIDINE 20 MG PO TABS
20.0000 mg | ORAL_TABLET | Freq: Every day | ORAL | 3 refills | Status: AC
  Filled 2024-07-21: qty 90, 90d supply, fill #0
  Filled 2024-07-22: qty 30, 30d supply, fill #0
  Filled 2024-08-19: qty 30, 30d supply, fill #1
  Filled ????-??-??: fill #1

## 2024-07-22 ENCOUNTER — Other Ambulatory Visit (HOSPITAL_BASED_OUTPATIENT_CLINIC_OR_DEPARTMENT_OTHER): Payer: Self-pay

## 2024-07-22 ENCOUNTER — Other Ambulatory Visit

## 2024-07-22 ENCOUNTER — Other Ambulatory Visit: Payer: Self-pay

## 2024-07-22 NOTE — Procedures (Signed)
   History:  68 year old woman with cognitive impairment  EEG classification:  Awake and asleep  Duration: 26 minutes   Technical aspects: This EEG study was done with scalp electrodes positioned according to the 10-20 International system of electrode placement. Electrical activity was reviewed with band pass filter of 1-70Hz , sensitivity of 7 uV/mm, display speed of 67mm/sec with a 60Hz  notched filter applied as appropriate. EEG data were recorded continuously and digitally stored.   Description of the recording: The background rhythms of this recording consists of a fairly well modulated medium amplitude background activity of 10 Hz. As the record progresses, the patient initially is in the waking state, but appears to enter the early stage II sleep during the recording, with rudimentary sleep spindles and vertex sharp wave activity seen. During the wakeful state, photic stimulation was performed, and no abnormal responses were seen. Hyperventilation was also performed, no abnormal response seen. No epileptiform discharges seen during this recording. There was intermittent left focal slowing.   Abnormality: Intermittent left focal slowing.    Impression: This routine EEG is suggestive of neuronal dysfunction in the left temporal region. There were no seizures or epileptiform discharges.    Pansey Pinheiro, MD [facility]

## 2024-07-23 ENCOUNTER — Other Ambulatory Visit: Payer: Self-pay

## 2024-07-23 ENCOUNTER — Encounter: Payer: Self-pay | Admitting: Physical Therapy

## 2024-07-23 ENCOUNTER — Ambulatory Visit: Admitting: Physical Therapy

## 2024-07-23 DIAGNOSIS — M542 Cervicalgia: Secondary | ICD-10-CM | POA: Diagnosis not present

## 2024-07-23 DIAGNOSIS — M6281 Muscle weakness (generalized): Secondary | ICD-10-CM

## 2024-07-23 NOTE — Therapy (Signed)
 OUTPATIENT PHYSICAL THERAPY TREATMENT   Patient Name: Marie Farmer MRN: [id number] DOB:May 17, 1956, 68 y.o., female Today's Date: 07/23/2024  END OF SESSION:  PT End of Session - 07/23/24 0930     Visit Number 6    Number of Visits 12    Date for Recertification  08/11/24    Progress Note Due on Visit 10    PT Start Time 0925    PT Stop Time 1010    PT Time Calculation (min) 45 min    Activity Tolerance Patient tolerated treatment well    Behavior During Therapy WFL for tasks assessed/performed           Past Medical History:  Diagnosis Date   Anxiety    Asthma    DDD (degenerative disc disease)    chronic back pain   Fibromyalgia    Foot pain    GERD (gastroesophageal reflux disease)    Hypertension    IBS (irritable bowel syndrome)    Mood disorder    PONV (postoperative nausea and vomiting)    Sleep apnea    Venous insufficiency    Wears glasses    Past Surgical History:  Procedure Laterality Date   CARPAL TUNNEL RELEASE Right 06/10/2013   Procedure: CARPAL TUNNEL RELEASE;  Surgeon: Arley JONELLE Curia, MD;  Location: [facility];  Service: Orthopedics;  Laterality: Right;   CHOLECYSTECTOMY N/A 08/19/2021   Procedure: LAPAROSCOPIC CHOLECYSTECTOMY;  Surgeon: Kallie Manuelita BROCKS, MD;  Location: [facility];  Service: General;  Laterality: N/A;   COLONOSCOPY  12/20/2011   [name]-single diminutive sessile polyp in the sigmoid colon/small interenal hemorrhoids   COLONOSCOPY N/A 11/28/2017   Procedure: COLONOSCOPY;  Surgeon: Shaaron Lamar HERO, MD;  Location: [facility];  Service: Endoscopy;  Laterality: N/A;  10:30   COLONOSCOPY WITH PROPOFOL  N/A 07/07/2021   one 9 mm polyp in rectum s/p removal (serrated adenoma). surveillance in 5 years   ESOPHAGOGASTRODUODENOSCOPY  04/10/2003   Normal esophagus/Antral erosions, as described above.  The remainder of the stomach appeared normal.  Normal first and second parts of the duodenum.   ESOPHAGOGASTRODUODENOSCOPY   12/20/2011   [name]-hiatus hernia/chronic gastritis/normal duodenumNEGATIVE H pylori, negative SB biopsy   POLYPECTOMY  07/07/2021   Procedure: POLYPECTOMY;  Surgeon: Shaaron Lamar HERO, MD;  [facility]: [facility];  Service: Endoscopy;;   TIBIA FRACTURE SURGERY  09/11/2010   right   Patient Active Problem List   Diagnosis Date Noted   MCI (mild cognitive impairment) 06/05/2024   Dyslexia 06/05/2024   Family history of dementia 06/05/2024   Internal hemorrhoids 07/20/2022   Anal fissure 07/20/2022   Bunion 12/19/2021   DDD (degenerative disc disease), lumbar 12/12/2021   Fibromyalgia 12/12/2021   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   Other specified nontoxic goiter 12/12/2021   Anemia of chronic disease 08/02/2021   Ankle impingement syndrome, left 03/25/2020   Osteochondritis dissecans of talus, left 03/25/2020   Instability of left ankle joint 02/03/2020   Plantar fasciitis 04/18/2018   Aphthous ulcer of mouth 04/02/2018   Difficulty sleeping 04/02/2018   Under emotional stress 04/02/2018   OSA on CPAP 02/01/2018   Dyspnea on exertion 10/16/2016   Essential hypertension 10/16/2016   Morbid obesity due to excess calories (HCC) 10/16/2016   IBS (irritable bowel syndrome) 02/29/2012   Tibial plateau fracture 05/10/2011   Helicobacter pylori infection 08/25/2009   DEPRESSION 08/25/2009   Asthma 08/25/2009   GASTROESOPHAGEAL REFLUX DISEASE, CHRONIC 08/25/2009   Allergy   08/25/2009   Cholelithiasis 08/25/2009    PCP: Rosamond Leta NOVAK, MD   REFERRING PROVIDER: Orpha Stabs  REFERRING DIAG: F45.87 Cervical radiculopathy  Rationale for Evaluation and Treatment:  Rehabiliation  THERAPY DIAG:  Cervicalgia  Muscle weakness (generalized)  ONSET DATE: 5 years of neck pain   SUBJECTIVE:                                                                                                                                                                                            SUBJECTIVE STATEMENT: She says she is feeling good today, but also is medicated.She does feel her neck does not crunch as much as it did.   PERTINENT HISTORY:  See above PMH  PAIN:  NPRS scale: 1/10 upon arrival but took pain medicine. Pain location:Base of neck and upper back, obliques and hip  Pain description: sort of sharp  Aggravating factors: using pillow, pressure on her neck, sewing Relieving factors: laying down without a pillow, traction for neck nothing for neck pain    PRECAUTIONS: ,  None  RED FLAGS: None   WEIGHT BEARING RESTRICTIONS:  No  FALLS:  Has patient fallen in last 6 months? No   PLOF:  Independent  PATIENT GOALS:  Reduce pain  OBJECTIVE:  Note: Objective measures were completed at Evaluation unless otherwise noted.  DIAGNOSTIC FINDINGS:  Neck MRI 07/10/24 IMPRESSION: 1. Right paracentral disc protrusion at C4-5 with resultant mild spinal stenosis. 2. Degenerative disc osteophyte at C6-7 with resultant moderate left C7 foraminal stenosis. 3. Disc bulging with right paracentral to foraminal disc protrusions at T1-2 and T2-3 with resultant mild right foraminal stenosis.  PATIENT SURVEYS:  Patient-Specific Activity Scoring Scheme  0 represents "unable to perform." 10 represents "able to perform at prior level. 0 1 2 3 4 5 6 7 8 9  10 (Date and Score)   Activity Eval     1. Reaching overhead 5    2. Exercise classes 5    3.     4.    5.    Score 5/10    Total score = sum of the activity scores/number of activities Minimum detectable change (90%CI) for average score = 2 points Minimum detectable change (90%CI) for single activity score = 3 points     EDEMA:  No   CERVICAL ROM:   Active ROM AROM (deg) eval  Flexion 20  Extension 30  Right lateral flexion 40  Left lateral flexion 40  Right rotation 70  Left rotation 70   (Blank rows = not tested)   UPPER EXTREMITY MMT:  MMT  Right eval Left eval  Shoulder flexion 5 5  Shoulder extension    Shoulder abduction 5 5  Shoulder adduction    Shoulder extension    Shoulder internal rotation 5 5  Shoulder external rotation 4 4  Middle trapezius    Lower trapezius    Elbow flexion    Elbow extension    Wrist flexion    Wrist extension    Wrist ulnar deviation    Wrist radial deviation    Wrist pronation    Wrist supination    Grip strength     (Blank rows = not tested)                                                                                                                           TREATMENT DATE:  07/23/24 Seated thoracic extensions 5 sec X 10 Seated cervical retractions X 10, hold 5 sec Neck isometrics 5 sec X 10 each for lateral sidebending bilat, flexion, extension Seated upper trap stretch 30 sec X 3 bilat Seated neck rotation stretch AAROM with towel 5 sec X 10 bilat Scapular retractions with rhomboid stretching 5 sec X 10 each way Bilat shoulder ER squeeze robber 5 sec X10 Mechanical cervical traction 21-16# intermittent X 15 min  07/16/24 Seated thoracic extensions 5 sec X 10 Seated cervical retractions X 10, hold 5 sec Neck isometrics 5 sec X 10 each for lateral sidebending bilat, flexion, extension Seated upper trap stretch 30 sec X 3 bilat Seated neck rotation stretch AAROM with towel 5 sec X 10 bilat Scapular retractions 5 sec X 10 Showed her home cervical traction units Mechanical cervical traction 20-15# intermittent X 15 min   PATIENT EDUCATION: Education details: HEP, PT plan of care,  Person educated: Patient Education method: Explanation, Demonstration, Verbal cues, and Handouts Education comprehension: verbalized understanding, further education recommended   HOME EXERCISE PROGRAM:  Access Code: [email or web address] URL: [email or web address] Date: 07/11/2024 Prepared by: Redell Moose  Exercises - Seated Thoracic Self Mobilization  - 5 x daily - 7 x weekly -  1 sets - 10 reps - 5 sec hold - Seated Cervical Retraction  - 5 x daily - 6 x weekly - 2 sets - 10 reps - Seated Cervical Sidebending Stretch (Mirrored)  - 1 x daily - 6 x weekly - 1 sets - 3 reps - 30 sec hold - Seated Assisted Cervical Rotation with Towel  - 1 x daily - 6 x weekly - 1 sets - 10 reps - 5 sec hold - Standing Isometric Cervical Flexion with Manual Resistance  - 1 x daily - 6 x weekly - 3 sets - 10 reps - Standing Isometric Cervical Sidebending with Manual Resistance  - 1 x daily - 6 x weekly - 1 sets - 10 reps - 6 hold - Standing Isometric Cervical Extension with Manual Resistance  - 1 x daily - 6 x weekly - 1 sets - 10 reps - 5 hold - Shoulder External Rotation with Anchored Resistance  -  1 x daily - 3 x weekly - 2 sets - 15 reps - Shoulder Internal Rotation with Resistance (Mirrored)  - 1 x daily - 3 x weekly - 2 sets - 15 reps  ASSESSMENT:  CLINICAL IMPRESSION:  She is reporting improvements in less popping/crepitus in her neck. Reviewed HEP again and she shows good understanding and compliance with them.    Per eval: Patient referred to PT for cervical radiculopathy. She may also have some left RTC injury as she is weak with external rotation in this arm and gets pain reaching overhead. Patient will benefit from skilled PT to improve overall function and to address impairments and limitations listed below.  OBJECTIVE IMPAIRMENTS: decreased activity tolerance for ADL's, decreased ROM, decreased strength, impaired flexibility, impaired UE use, and pain.  ACTIVITY LIMITATIONS: exercise classes, cleaning, community activity, driving, reaching, carry PERSONAL FACTORS: see above PMH  also affecting patient's functional outcome.  REHAB POTENTIAL: Good  CLINICAL DECISION MAKING: Evolving/moderate complexity  EVALUATION COMPLEXITY: Moderate    GOALS: Short term PT Goals Target date: 07/28/2024   Pt will be I and compliant with HEP. Baseline:  Goal status: New Pt will  decrease pain by 25% overall Baseline: Goal status: New  Long term PT goals Target date:08/11/2024   Pt will improve neck ROM to [facility] to improve functional mobility Baseline: Goal status: New Pt will improve left shoulder strength to at least 4+/5 MMT to improve functional strength Baseline: Goal status: New Pt will improve Patient specific functional scale (PSFS) to at least 7/10 to show improved function level Baseline: Goal status: New Pt will reduce pain to overall less than 3/10 with usual activity and work activity. Baseline: Goal status: New  PLAN: PT FREQUENCY: 1-2 times per week   PT DURATION: 4-6 weeks  PLANNED INTERVENTIONS  97110-Therapeutic exercises, 97530- Therapeutic activity, [id number]- Neuromuscular re-education, 97535- Self Care, 02859- Manual therapy, G0283- Electrical stimulation (unattended), [id number]- Ultrasound, 02987- Traction (mechanical), and 02966- Ionotophoresis 4mg /ml Dexamethasone   PLAN FOR NEXT SESSION: traction and mckenezie program.   NEXT MD VISIT:   Redell JONELLE Moose, PT,DPT 07/23/2024, 9:48 AM

## 2024-07-24 ENCOUNTER — Other Ambulatory Visit: Payer: Self-pay

## 2024-07-25 ENCOUNTER — Encounter: Payer: Self-pay | Admitting: Physical Therapy

## 2024-07-25 ENCOUNTER — Ambulatory Visit: Admitting: Physical Therapy

## 2024-07-25 DIAGNOSIS — M542 Cervicalgia: Secondary | ICD-10-CM | POA: Diagnosis not present

## 2024-07-25 DIAGNOSIS — M6281 Muscle weakness (generalized): Secondary | ICD-10-CM

## 2024-07-25 NOTE — Therapy (Signed)
 OUTPATIENT PHYSICAL THERAPY TREATMENT   Patient Name: Marie Farmer MRN: [id number] DOB:04-09-1956, 68 y.o., female Today's Date: 07/25/2024  END OF SESSION:  PT End of Session - 07/25/24 1034     Visit Number 7    Number of Visits 12    Date for Recertification  08/11/24    Progress Note Due on Visit 10    PT Start Time 1015    PT Stop Time 1100    PT Time Calculation (min) 45 min    Activity Tolerance Patient tolerated treatment well    Behavior During Therapy WFL for tasks assessed/performed           Past Medical History:  Diagnosis Date   Anxiety    Asthma    DDD (degenerative disc disease)    chronic back pain   Fibromyalgia    Foot pain    GERD (gastroesophageal reflux disease)    Hypertension    IBS (irritable bowel syndrome)    Mood disorder    PONV (postoperative nausea and vomiting)    Sleep apnea    Venous insufficiency    Wears glasses    Past Surgical History:  Procedure Laterality Date   CARPAL TUNNEL RELEASE Right 06/10/2013   Procedure: CARPAL TUNNEL RELEASE;  Surgeon: Arley JONELLE Curia, MD;  Location: [facility];  Service: Orthopedics;  Laterality: Right;   CHOLECYSTECTOMY N/A 08/19/2021   Procedure: LAPAROSCOPIC CHOLECYSTECTOMY;  Surgeon: Kallie Manuelita BROCKS, MD;  Location: [facility];  Service: General;  Laterality: N/A;   COLONOSCOPY  12/20/2011   [name]-single diminutive sessile polyp in the sigmoid colon/small interenal hemorrhoids   COLONOSCOPY N/A 11/28/2017   Procedure: COLONOSCOPY;  Surgeon: Shaaron Lamar HERO, MD;  Location: [facility];  Service: Endoscopy;  Laterality: N/A;  10:30   COLONOSCOPY WITH PROPOFOL  N/A 07/07/2021   one 9 mm polyp in rectum s/p removal (serrated adenoma). surveillance in 5 years   ESOPHAGOGASTRODUODENOSCOPY  04/10/2003   Normal esophagus/Antral erosions, as described above.  The remainder of the stomach appeared normal.  Normal first and second parts of the duodenum.   ESOPHAGOGASTRODUODENOSCOPY   12/20/2011   [name]-hiatus hernia/chronic gastritis/normal duodenumNEGATIVE H pylori, negative SB biopsy   POLYPECTOMY  07/07/2021   Procedure: POLYPECTOMY;  Surgeon: Shaaron Lamar HERO, MD;  Location: [facility];  Service: Endoscopy;;   TIBIA FRACTURE SURGERY  09/11/2010   right   Patient Active Problem List   Diagnosis Date Noted   MCI (mild cognitive impairment) 06/05/2024   Dyslexia 06/05/2024   Family history of dementia 06/05/2024   Internal hemorrhoids 07/20/2022   Anal fissure 07/20/2022   Bunion 12/19/2021   DDD (degenerative disc disease), lumbar 12/12/2021   Fibromyalgia 12/12/2021   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   Other specified nontoxic goiter 12/12/2021   Anemia of chronic disease 08/02/2021   Ankle impingement syndrome, left 03/25/2020   Osteochondritis dissecans of talus, left 03/25/2020   Instability of left ankle joint 02/03/2020   Plantar fasciitis 04/18/2018   Aphthous ulcer of mouth 04/02/2018   Difficulty sleeping 04/02/2018   Under emotional stress 04/02/2018   OSA on CPAP 02/01/2018   Dyspnea on exertion 10/16/2016   Essential hypertension 10/16/2016   Morbid obesity due to excess calories (HCC) 10/16/2016   IBS (irritable bowel syndrome) 02/29/2012   Tibial plateau fracture 05/10/2011   Helicobacter pylori infection 08/25/2009   DEPRESSION 08/25/2009   Asthma 08/25/2009   GASTROESOPHAGEAL REFLUX DISEASE, CHRONIC 08/25/2009   Allergy   08/25/2009   Cholelithiasis 08/25/2009    PCP: Rosamond Leta NOVAK, MD   REFERRING PROVIDER: Orpha Stabs  REFERRING DIAG: F45.87 Cervical radiculopathy  Rationale for Evaluation and Treatment:  Rehabiliation  THERAPY DIAG:  Cervicalgia  Muscle weakness (generalized)  ONSET DATE: 5 years of neck pain   SUBJECTIVE:                                                                                                                                                                                            SUBJECTIVE STATEMENT: She says pain is good again today.  PERTINENT HISTORY:  See above PMH  PAIN:  NPRS scale: 1/10 upon arrival but took pain medicine. Pain location:Base of neck and upper back, obliques and hip  Pain description: sort of sharp  Aggravating factors: using pillow, pressure on her neck, sewing Relieving factors: laying down without a pillow, traction for neck nothing for neck pain    PRECAUTIONS: ,  None  RED FLAGS: None   WEIGHT BEARING RESTRICTIONS:  No  FALLS:  Has patient fallen in last 6 months? No   PLOF:  Independent  PATIENT GOALS:  Reduce pain  OBJECTIVE:  Note: Objective measures were completed at Evaluation unless otherwise noted.  DIAGNOSTIC FINDINGS:  Neck MRI 07/10/24 IMPRESSION: 1. Right paracentral disc protrusion at C4-5 with resultant mild spinal stenosis. 2. Degenerative disc osteophyte at C6-7 with resultant moderate left C7 foraminal stenosis. 3. Disc bulging with right paracentral to foraminal disc protrusions at T1-2 and T2-3 with resultant mild right foraminal stenosis.  PATIENT SURVEYS:  Patient-Specific Activity Scoring Scheme  0 represents "unable to perform." 10 represents "able to perform at prior level. 0 1 2 3 4 5 6 7 8 9  10 (Date and Score)   Activity Eval     1. Reaching overhead 5    2. Exercise classes 5    3.     4.    5.    Score 5/10    Total score = sum of the activity scores/number of activities Minimum detectable change (90%CI) for average score = 2 points Minimum detectable change (90%CI) for single activity score = 3 points     EDEMA:  No   CERVICAL ROM:   Active ROM AROM (deg) eval  Flexion 20  Extension 30  Right lateral flexion 40  Left lateral flexion 40  Right rotation 70  Left rotation 70   (Blank rows = not tested)   UPPER EXTREMITY MMT:  MMT Right eval Left eval  Shoulder flexion 5 5  Shoulder extension    Shoulder  abduction 5 5  Shoulder adduction  Shoulder extension    Shoulder internal rotation 5 5  Shoulder external rotation 4 4  Middle trapezius    Lower trapezius    Elbow flexion    Elbow extension    Wrist flexion    Wrist extension    Wrist ulnar deviation    Wrist radial deviation    Wrist pronation    Wrist supination    Grip strength     (Blank rows = not tested)                                                                                                                           TREATMENT DATE:  07/25/24 Seated thoracic extensions 5 sec X 10 Seated cervical retractions X 10, hold 5 sec Neck isometrics 5 sec X 10 each for lateral sidebending bilat, flexion, extension Seated upper trap stretch 30 sec X 3 bilat Seated neck rotation stretch AAROM with towel 5 sec X 10 bilat Scapular retractions with rhomboid stretching 5 sec X 10 each way Bilat shoulder ER squeeze robber 5 sec X10 Mechanical cervical traction 21-16# intermittent X 15 min  07/23/24 Seated thoracic extensions 5 sec X 10 Seated cervical retractions X 10, hold 5 sec Neck isometrics 5 sec X 10 each for lateral sidebending bilat, flexion, extension Seated upper trap stretch 30 sec X 3 bilat Seated neck rotation stretch AAROM with towel 5 sec X 10 bilat Scapular retractions with rhomboid stretching 5 sec X 10 each way Bilat shoulder ER squeeze robber 5 sec X10 Mechanical cervical traction 21-16# intermittent X 15 min   PATIENT EDUCATION: Education details: HEP, PT plan of care,  Person educated: Patient Education method: Explanation, Demonstration, Verbal cues, and Handouts Education comprehension: verbalized understanding, further education recommended   HOME EXERCISE PROGRAM:  Access Code: [email or web address] URL: [email or web address] Date: 07/11/2024 Prepared by: Redell Moose  Exercises - Seated Thoracic Self Mobilization  - 5 x daily - 7 x weekly - 1 sets - 10 reps - 5 sec hold - Seated  Cervical Retraction  - 5 x daily - 6 x weekly - 2 sets - 10 reps - Seated Cervical Sidebending Stretch (Mirrored)  - 1 x daily - 6 x weekly - 1 sets - 3 reps - 30 sec hold - Seated Assisted Cervical Rotation with Towel  - 1 x daily - 6 x weekly - 1 sets - 10 reps - 5 sec hold - Standing Isometric Cervical Flexion with Manual Resistance  - 1 x daily - 6 x weekly - 3 sets - 10 reps - Standing Isometric Cervical Sidebending with Manual Resistance  - 1 x daily - 6 x weekly - 1 sets - 10 reps - 6 hold - Standing Isometric Cervical Extension with Manual Resistance  - 1 x daily - 6 x weekly - 1 sets - 10 reps - 5 hold - Shoulder External Rotation with Anchored Resistance  - 1 x daily - 3 x weekly - 2 sets - 15  reps - Shoulder Internal Rotation with Resistance (Mirrored)  - 1 x daily - 3 x weekly - 2 sets - 15 reps  ASSESSMENT:  CLINICAL IMPRESSION:  She has been very compliant with HEP, and her pain has been lower this week overall.  Per eval: Patient referred to PT for cervical radiculopathy. She may also have some left RTC injury as she is weak with external rotation in this arm and gets pain reaching overhead. Patient will benefit from skilled PT to improve overall function and to address impairments and limitations listed below.  OBJECTIVE IMPAIRMENTS: decreased activity tolerance for ADL's, decreased ROM, decreased strength, impaired flexibility, impaired UE use, and pain.  ACTIVITY LIMITATIONS: exercise classes, cleaning, community activity, driving, reaching, carry PERSONAL FACTORS: see above PMH  also affecting patient's functional outcome.  REHAB POTENTIAL: Good  CLINICAL DECISION MAKING: Evolving/moderate complexity  EVALUATION COMPLEXITY: Moderate    GOALS: Short term PT Goals Target date: 07/28/2024   Pt will be I and compliant with HEP. Baseline:  Goal status: New Pt will decrease pain by 25% overall Baseline: Goal status: New  Long term PT goals Target  date:08/11/2024   Pt will improve neck ROM to [facility] to improve functional mobility Baseline: Goal status: New Pt will improve left shoulder strength to at least 4+/5 MMT to improve functional strength Baseline: Goal status: New Pt will improve Patient specific functional scale (PSFS) to at least 7/10 to show improved function level Baseline: Goal status: New Pt will reduce pain to overall less than 3/10 with usual activity and work activity. Baseline: Goal status: New  PLAN: PT FREQUENCY: 1-2 times per week   PT DURATION: 4-6 weeks  PLANNED INTERVENTIONS  97110-Therapeutic exercises, 97530- Therapeutic activity, [id number]- Neuromuscular re-education, 97535- Self Care, 02859- Manual therapy, G0283- Electrical stimulation (unattended), [id number]- Ultrasound, 02987- Traction (mechanical), and 02966- Ionotophoresis 4mg /ml Dexamethasone   PLAN FOR NEXT SESSION: traction and mckenezie program.   NEXT MD VISIT:   Redell JONELLE Moose, PT,DPT 07/25/2024, 10:35 AM

## 2024-07-28 ENCOUNTER — Other Ambulatory Visit (HOSPITAL_BASED_OUTPATIENT_CLINIC_OR_DEPARTMENT_OTHER): Payer: Self-pay

## 2024-07-28 ENCOUNTER — Encounter: Payer: Self-pay | Admitting: Physical Therapy

## 2024-07-28 DIAGNOSIS — M5412 Radiculopathy, cervical region: Secondary | ICD-10-CM | POA: Diagnosis not present

## 2024-07-28 MED ORDER — DIAZEPAM 5 MG PO TABS
5.0000 mg | ORAL_TABLET | ORAL | 0 refills | Status: AC
  Filled 2024-07-28: qty 2, 1d supply, fill #0

## 2024-07-29 ENCOUNTER — Other Ambulatory Visit (HOSPITAL_BASED_OUTPATIENT_CLINIC_OR_DEPARTMENT_OTHER): Payer: Self-pay

## 2024-07-29 ENCOUNTER — Other Ambulatory Visit: Payer: Self-pay

## 2024-07-29 MED ORDER — VALACYCLOVIR HCL 1 G PO TABS
1000.0000 mg | ORAL_TABLET | Freq: Two times a day (BID) | ORAL | 2 refills | Status: AC | PRN
  Filled 2024-07-29: qty 6, 3d supply, fill #0
  Filled 2024-08-06: qty 6, 3d supply, fill #1
  Filled 2024-08-20: qty 6, 3d supply, fill #2

## 2024-07-29 MED ORDER — VALACYCLOVIR HCL 500 MG PO TABS
500.0000 mg | ORAL_TABLET | Freq: Two times a day (BID) | ORAL | 0 refills | Status: AC | PRN
  Filled 2024-07-29: qty 6, 3d supply, fill #0

## 2024-07-31 ENCOUNTER — Other Ambulatory Visit: Payer: Self-pay

## 2024-07-31 DIAGNOSIS — Z0000 Encounter for general adult medical examination without abnormal findings: Secondary | ICD-10-CM | POA: Diagnosis not present

## 2024-07-31 DIAGNOSIS — R631 Polydipsia: Secondary | ICD-10-CM | POA: Diagnosis not present

## 2024-07-31 DIAGNOSIS — Z299 Encounter for prophylactic measures, unspecified: Secondary | ICD-10-CM | POA: Diagnosis not present

## 2024-07-31 DIAGNOSIS — E039 Hypothyroidism, unspecified: Secondary | ICD-10-CM | POA: Diagnosis not present

## 2024-07-31 DIAGNOSIS — M069 Rheumatoid arthritis, unspecified: Secondary | ICD-10-CM | POA: Diagnosis not present

## 2024-07-31 DIAGNOSIS — I10 Essential (primary) hypertension: Secondary | ICD-10-CM | POA: Diagnosis not present

## 2024-07-31 DIAGNOSIS — Z23 Encounter for immunization: Secondary | ICD-10-CM | POA: Diagnosis not present

## 2024-07-31 DIAGNOSIS — R52 Pain, unspecified: Secondary | ICD-10-CM | POA: Diagnosis not present

## 2024-08-01 ENCOUNTER — Ambulatory Visit: Admitting: Physical Therapy

## 2024-08-01 ENCOUNTER — Encounter: Payer: Self-pay | Admitting: Physical Therapy

## 2024-08-01 DIAGNOSIS — M542 Cervicalgia: Secondary | ICD-10-CM

## 2024-08-01 DIAGNOSIS — M6281 Muscle weakness (generalized): Secondary | ICD-10-CM

## 2024-08-01 NOTE — Therapy (Signed)
 OUTPATIENT PHYSICAL THERAPY TREATMENT   Patient Name: Marie Farmer MRN: [id number] DOB:[birth year], 68 y.o., female Today's Date: 08/01/2024  END OF SESSION:  PT End of Session - 08/01/24 1013     Visit Number 8    Number of Visits 12    Date for Recertification  08/11/24    Progress Note Due on Visit 10    PT Start Time 1012    PT Stop Time 1055    PT Time Calculation (min) 43 min    Activity Tolerance Patient tolerated treatment well    Behavior During Therapy WFL for tasks assessed/performed           Past Medical History:  Diagnosis Date   Anxiety    Asthma    DDD (degenerative disc disease)    chronic back pain   Fibromyalgia    Foot pain    GERD (gastroesophageal reflux disease)    Hypertension    IBS (irritable bowel syndrome)    Mood disorder    PONV (postoperative nausea and vomiting)    Sleep apnea    Venous insufficiency    Wears glasses    Past Surgical History:  Procedure Laterality Date   CARPAL TUNNEL RELEASE Right 06/10/2013   Procedure: CARPAL TUNNEL RELEASE;  Surgeon: Arley JONELLE Curia, MD;  Location: [facility];  Service: Orthopedics;  Laterality: Right;   CHOLECYSTECTOMY N/A 08/19/2021   Procedure: LAPAROSCOPIC CHOLECYSTECTOMY;  Surgeon: Kallie Manuelita BROCKS, MD;  Location: [facility];  Service: General;  Laterality: N/A;   COLONOSCOPY  12/20/2011   [name]-single diminutive sessile polyp in the sigmoid colon/small interenal hemorrhoids   COLONOSCOPY N/A 11/28/2017   Procedure: COLONOSCOPY;  Surgeon: Shaaron Lamar HERO, MD;  Location: [facility];  Service: Endoscopy;  Laterality: N/A;  10:30   COLONOSCOPY WITH PROPOFOL  N/A 07/07/2021   one 9 mm polyp in rectum s/p removal (serrated adenoma). surveillance in 5 years   ESOPHAGOGASTRODUODENOSCOPY  04/10/2003   Normal esophagus/Antral erosions, as described above.  The remainder of the stomach appeared normal.  Normal first and second parts of the duodenum.   ESOPHAGOGASTRODUODENOSCOPY   12/20/2011   [name]-hiatus hernia/chronic gastritis/normal duodenumNEGATIVE H pylori, negative SB biopsy   POLYPECTOMY  07/07/2021   Procedure: POLYPECTOMY;  Surgeon: Shaaron Lamar HERO, MD;  Location: [facility];  Service: Endoscopy;;   TIBIA FRACTURE SURGERY  09/11/2010   right   Patient Active Problem List   Diagnosis Date Noted   MCI (mild cognitive impairment) 06/05/2024   Dyslexia 06/05/2024   Family history of dementia 06/05/2024   Internal hemorrhoids 07/20/2022   Anal fissure 07/20/2022   Bunion 12/19/2021   DDD (degenerative disc disease), lumbar 12/12/2021   Fibromyalgia 12/12/2021   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   Other specified nontoxic goiter 12/12/2021   Anemia of chronic disease 08/02/2021   Ankle impingement syndrome, left 03/25/2020   Osteochondritis dissecans of talus, left 03/25/2020   Instability of left ankle joint 02/03/2020   Plantar fasciitis 04/18/2018   Aphthous ulcer of mouth 04/02/2018   Difficulty sleeping 04/02/2018   Under emotional stress 04/02/2018   OSA on CPAP 02/01/2018   Dyspnea on exertion 10/16/2016   Essential hypertension 10/16/2016   Morbid obesity due to excess calories (HCC) 10/16/2016   IBS (irritable bowel syndrome) 02/29/2012   Tibial plateau fracture 05/10/2011   Helicobacter pylori infection 08/25/2009   DEPRESSION 08/25/2009   Asthma 08/25/2009   GASTROESOPHAGEAL REFLUX DISEASE, CHRONIC 08/25/2009   Allergy   08/25/2009   Cholelithiasis 08/25/2009    PCP: Rosamond Leta NOVAK, MD   REFERRING PROVIDER: Orpha Stabs  REFERRING DIAG: F45.87 Cervical radiculopathy  Rationale for Evaluation and Treatment:  Rehabiliation  THERAPY DIAG:  Cervicalgia  Muscle weakness (generalized)  ONSET DATE: 5 years of neck pain   SUBJECTIVE:                                                                                                                                                                                            SUBJECTIVE STATEMENT: She is having some more pain in the middle of her back today.  PERTINENT HISTORY:  See above PMH  PAIN:  NPRS scale: 3/10 upon arrival but took pain medicine. Pain location:Base of neck and upper back, obliques and hip  Pain description: sort of sharp  Aggravating factors: using pillow, pressure on her neck, sewing Relieving factors: laying down without a pillow, traction for neck nothing for neck pain    PRECAUTIONS: ,  None  RED FLAGS: None   WEIGHT BEARING RESTRICTIONS:  No  FALLS:  Has patient fallen in last 6 months? No   PLOF:  Independent  PATIENT GOALS:  Reduce pain  OBJECTIVE:  Note: Objective measures were completed at Evaluation unless otherwise noted.  DIAGNOSTIC FINDINGS:  Neck MRI 07/10/24 IMPRESSION: 1. Right paracentral disc protrusion at C4-5 with resultant mild spinal stenosis. 2. Degenerative disc osteophyte at C6-7 with resultant moderate left C7 foraminal stenosis. 3. Disc bulging with right paracentral to foraminal disc protrusions at T1-2 and T2-3 with resultant mild right foraminal stenosis.  PATIENT SURVEYS:  Patient-Specific Activity Scoring Scheme  0 represents "unable to perform." 10 represents "able to perform at prior level. 0 1 2 3 4 5 6 7 8 9  10 (Date and Score)   Activity Eval     1. Reaching overhead 5    2. Exercise classes 5    3.     4.    5.    Score 5/10    Total score = sum of the activity scores/number of activities Minimum detectable change (90%CI) for average score = 2 points Minimum detectable change (90%CI) for single activity score = 3 points     EDEMA:  No   CERVICAL ROM:   Active ROM AROM (deg) eval  Flexion 20  Extension 30  Right lateral flexion 40  Left lateral flexion 40  Right rotation 70  Left rotation 70   (Blank rows = not tested)   UPPER EXTREMITY MMT:  MMT Right eval Left eval  Shoulder flexion 5 5  Shoulder  extension    Shoulder  abduction 5 5  Shoulder adduction    Shoulder extension    Shoulder internal rotation 5 5  Shoulder external rotation 4 4  Middle trapezius    Lower trapezius    Elbow flexion    Elbow extension    Wrist flexion    Wrist extension    Wrist ulnar deviation    Wrist radial deviation    Wrist pronation    Wrist supination    Grip strength     (Blank rows = not tested)                                                                                                                           TREATMENT DATE:  08/01/24 Standing thoracic extensions with strap 5 sec X 10 Standing lumabar L stretch 10 sec X 10 Standing shoulder extensions red band X 10 reps bilat Seated cervical retractions X 10, hold 5 sec Neck isometrics 5 sec X 10 each for lateral sidebending bilat, flexion, extension Seated neck rotation stretch AAROM with towel 5 sec X 10 bilat Bilat shoulder ER squeeze robber 5 sec X10 Mechanical cervical traction 21-16# intermittent X 15 min  07/25/24 Seated thoracic extensions 5 sec X 10 Seated cervical retractions X 10, hold 5 sec Neck isometrics 5 sec X 10 each for lateral sidebending bilat, flexion, extension Seated upper trap stretch 30 sec X 3 bilat Seated neck rotation stretch AAROM with towel 5 sec X 10 bilat Scapular retractions with rhomboid stretching 5 sec X 10 each way Bilat shoulder ER squeeze robber 5 sec X10 Mechanical cervical traction 21-16# intermittent X 15 min    PATIENT EDUCATION: Education details: HEP, PT plan of care,  Person educated: Patient Education method: Explanation, Demonstration, Verbal cues, and Handouts Education comprehension: verbalized understanding, further education recommended   HOME EXERCISE PROGRAM: Access Code: [email or web address] URL: [email or web address] Date: 08/01/2024 Prepared by: Redell Moose  Exercises - Seated Thoracic Self Mobilization  - 5 x daily - 7 x weekly - 1 sets - 10 reps - 5  sec hold - Seated Cervical Retraction  - 5 x daily - 6 x weekly - 2 sets - 10 reps - Seated Cervical Sidebending Stretch (Mirrored)  - 1 x daily - 6 x weekly - 1 sets - 3 reps - 30 sec hold - Seated Assisted Cervical Rotation with Towel  - 1 x daily - 6 x weekly - 1 sets - 10 reps - 5 sec hold - Standing Isometric Cervical Flexion with Manual Resistance  - 1 x daily - 6 x weekly - 3 sets - 10 reps - Standing Isometric Cervical Sidebending with Manual Resistance  - 1 x daily - 6 x weekly - 1 sets - 10 reps - 6 hold - Standing Isometric Cervical Extension with Manual Resistance  - 1 x daily - 6 x weekly - 1 sets - 10 reps - 5 hold - Shoulder External Rotation with Anchored Resistance  - 1 x daily -  3 x weekly - 2 sets - 15 reps - Standing 'L' Stretch at Counter  - 2 x daily - 6 x weekly - 1 sets - 10 reps - 10 sec hold - Standing Lumbar Extension  - 2 x daily - 6 x weekly - 1 sets - 10 reps - 5 sec hold - Shoulder External Rotation and Scapular Retraction  - 2 x daily - 6 x weekly - 1 sets - 10 reps - 5 hold - Standing Shoulder Extension with Resistance  - 2 x daily - 6 x weekly - 1 sets - 10 reps   ASSESSMENT:  CLINICAL IMPRESSION:  She was having more pain in thoracic area so I showed her additional exercises to do for this area, added to HEP and printed these out for her.   Per eval: Patient referred to PT for cervical radiculopathy. She may also have some left RTC injury as she is weak with external rotation in this arm and gets pain reaching overhead. Patient will benefit from skilled PT to improve overall function and to address impairments and limitations listed below.  OBJECTIVE IMPAIRMENTS: decreased activity tolerance for ADL's, decreased ROM, decreased strength, impaired flexibility, impaired UE use, and pain.  ACTIVITY LIMITATIONS: exercise classes, cleaning, community activity, driving, reaching, carry PERSONAL FACTORS: see above PMH  also affecting patient's functional  outcome.  REHAB POTENTIAL: Good  CLINICAL DECISION MAKING: Evolving/moderate complexity  EVALUATION COMPLEXITY: Moderate    GOALS: Short term PT Goals Target date: 07/28/2024   Pt will be I and compliant with HEP. Baseline:  Goal status: MET 08/01/24 Pt will decrease pain by 25% overall Baseline: Goal status: MET 08/01/24  Long term PT goals Target date:08/11/2024   Pt will improve neck ROM to [facility] to improve functional mobility Baseline: Goal status: ongoing 08/01/24 Pt will improve left shoulder strength to at least 4+/5 MMT to improve functional strength Baseline: Goal status: ongoing 08/01/24 Pt will improve Patient specific functional scale (PSFS) to at least 7/10 to show improved function level Baseline: Goal status: ongoing 08/01/24 Pt will reduce pain to overall less than 3/10 with usual activity and work activity. Baseline: Goal status: ongoing 08/01/24  PLAN: PT FREQUENCY: 1-2 times per week   PT DURATION: 4-6 weeks  PLANNED INTERVENTIONS  97110-Therapeutic exercises, 97530- Therapeutic activity, 97112- Neuromuscular re-education, 97535- Self Care, 02859- Manual therapy, G0283- Electrical stimulation (unattended), 97035- Ultrasound, 02987- Traction (mechanical), and 97033- Ionotophoresis 4mg /ml Dexamethasone   PLAN FOR NEXT SESSION: traction and mckenezie program. How are new thoracic exercises going  NEXT MD VISIT:   Redell JONELLE Moose, PT,DPT 08/01/2024, 10:13 AM

## 2024-08-04 DIAGNOSIS — G4733 Obstructive sleep apnea (adult) (pediatric): Secondary | ICD-10-CM | POA: Diagnosis not present

## 2024-08-04 DIAGNOSIS — F341 Dysthymic disorder: Secondary | ICD-10-CM | POA: Diagnosis not present

## 2024-08-05 ENCOUNTER — Ambulatory Visit: Admitting: Physical Therapy

## 2024-08-05 ENCOUNTER — Encounter: Payer: Self-pay | Admitting: Physical Therapy

## 2024-08-05 ENCOUNTER — Other Ambulatory Visit: Payer: Self-pay

## 2024-08-05 ENCOUNTER — Other Ambulatory Visit (HOSPITAL_BASED_OUTPATIENT_CLINIC_OR_DEPARTMENT_OTHER): Payer: Self-pay

## 2024-08-05 DIAGNOSIS — M542 Cervicalgia: Secondary | ICD-10-CM

## 2024-08-05 DIAGNOSIS — M6281 Muscle weakness (generalized): Secondary | ICD-10-CM

## 2024-08-05 NOTE — Therapy (Signed)
 OUTPATIENT PHYSICAL THERAPY TREATMENT   Patient Name: Marie Farmer MRN: [id number] DOB:1956/07/26, 68 y.o., female Today's Date: 08/05/2024  END OF SESSION:  PT End of Session - 08/05/24 0953     Visit Number 9    Number of Visits 12    Date for Recertification  08/11/24    Progress Note Due on Visit 10    PT Start Time 0930    PT Stop Time 1015    PT Time Calculation (min) 45 min    Activity Tolerance Patient tolerated treatment well    Behavior During Therapy WFL for tasks assessed/performed           Past Medical History:  Diagnosis Date   Anxiety    Asthma    DDD (degenerative disc disease)    chronic back pain   Fibromyalgia    Foot pain    GERD (gastroesophageal reflux disease)    Hypertension    IBS (irritable bowel syndrome)    Mood disorder    PONV (postoperative nausea and vomiting)    Sleep apnea    Venous insufficiency    Wears glasses    Past Surgical History:  Procedure Laterality Date   CARPAL TUNNEL RELEASE Right 06/10/2013   Procedure: CARPAL TUNNEL RELEASE;  Surgeon: Arley JONELLE Curia, MD;  Location: [facility];  Service: Orthopedics;  Laterality: Right;   CHOLECYSTECTOMY N/A 08/19/2021   Procedure: LAPAROSCOPIC CHOLECYSTECTOMY;  Surgeon: Kallie Manuelita BROCKS, MD;  Location: [facility];  Service: General;  Laterality: N/A;   COLONOSCOPY  12/20/2011   [name]-single diminutive sessile polyp in the sigmoid colon/small interenal hemorrhoids   COLONOSCOPY N/A 11/28/2017   Procedure: COLONOSCOPY;  Surgeon: Shaaron Lamar HERO, MD;  Location: [facility];  Service: Endoscopy;  Laterality: N/A;  10:30   COLONOSCOPY WITH PROPOFOL  N/A 07/07/2021   one 9 mm polyp in rectum s/p removal (serrated adenoma). surveillance in 5 years   ESOPHAGOGASTRODUODENOSCOPY  04/10/2003   Normal esophagus/Antral erosions, as described above.  The remainder of the stomach appeared normal.  Normal first and second parts of the duodenum.   ESOPHAGOGASTRODUODENOSCOPY   12/20/2011   [name]-hiatus hernia/chronic gastritis/normal duodenumNEGATIVE H pylori, negative SB biopsy   POLYPECTOMY  07/07/2021   Procedure: POLYPECTOMY;  Surgeon: Shaaron Lamar HERO, MD;  Location: [facility];  Service: Endoscopy;;   TIBIA FRACTURE SURGERY  09/11/2010   right   Patient Active Problem List   Diagnosis Date Noted   MCI (mild cognitive impairment) 06/05/2024   Dyslexia 06/05/2024   Family history of dementia 06/05/2024   Internal hemorrhoids 07/20/2022   Anal fissure 07/20/2022   Bunion 12/19/2021   DDD (degenerative disc disease), lumbar 12/12/2021   Fibromyalgia 12/12/2021   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   Other specified nontoxic goiter 12/12/2021   Anemia of chronic disease 08/02/2021   Ankle impingement syndrome, left 03/25/2020   Osteochondritis dissecans of talus, left 03/25/2020   Instability of left ankle joint 02/03/2020   Plantar fasciitis 04/18/2018   Aphthous ulcer of mouth 04/02/2018   Difficulty sleeping 04/02/2018   Under emotional stress 04/02/2018   OSA on CPAP 02/01/2018   Dyspnea on exertion 10/16/2016   Essential hypertension 10/16/2016   Morbid obesity due to excess calories (HCC) 10/16/2016   IBS (irritable bowel syndrome) 02/29/2012   Tibial plateau fracture 05/10/2011   Helicobacter pylori infection 08/25/2009   DEPRESSION 08/25/2009   Asthma 08/25/2009   GASTROESOPHAGEAL REFLUX DISEASE, CHRONIC 08/25/2009   Allergy   08/25/2009   Cholelithiasis 08/25/2009    PCP: Rosamond Leta NOVAK, MD   REFERRING PROVIDER: Orpha Stabs  REFERRING DIAG: F45.87 Cervical radiculopathy  Rationale for Evaluation and Treatment:  Rehabiliation  THERAPY DIAG:  Cervicalgia  Muscle weakness (generalized)  ONSET DATE: 5 years of neck pain   SUBJECTIVE:                                                                                                                                                                                            SUBJECTIVE STATEMENT: She has been keeping up with her exercises.   PERTINENT HISTORY:  See above PMH  PAIN:  NPRS scale: 3/10 upon arrival but took pain medicine. Pain location:Base of neck and upper back, obliques and hip  Pain description: sort of sharp  Aggravating factors: using pillow, pressure on her neck, sewing Relieving factors: laying down without a pillow, traction for neck nothing for neck pain    PRECAUTIONS: ,  None  RED FLAGS: None   WEIGHT BEARING RESTRICTIONS:  No  FALLS:  Has patient fallen in last 6 months? No   PLOF:  Independent  PATIENT GOALS:  Reduce pain  OBJECTIVE:  Note: Objective measures were completed at Evaluation unless otherwise noted.  DIAGNOSTIC FINDINGS:  Neck MRI 07/10/24 IMPRESSION: 1. Right paracentral disc protrusion at C4-5 with resultant mild spinal stenosis. 2. Degenerative disc osteophyte at C6-7 with resultant moderate left C7 foraminal stenosis. 3. Disc bulging with right paracentral to foraminal disc protrusions at T1-2 and T2-3 with resultant mild right foraminal stenosis.  PATIENT SURVEYS:  Patient-Specific Activity Scoring Scheme  0 represents "unable to perform." 10 represents "able to perform at prior level. 0 1 2 3 4 5 6 7 8 9  10 (Date and Score)   Activity Eval     1. Reaching overhead 5    2. Exercise classes 5    3.     4.    5.    Score 5/10    Total score = sum of the activity scores/number of activities Minimum detectable change (90%CI) for average score = 2 points Minimum detectable change (90%CI) for single activity score = 3 points     EDEMA:  No   CERVICAL ROM:   Active ROM AROM (deg) eval  Flexion 20  Extension 30  Right lateral flexion 40  Left lateral flexion 40  Right rotation 70  Left rotation 70   (Blank rows = not tested)   UPPER EXTREMITY MMT:  MMT Right eval Left eval  Shoulder flexion 5 5  Shoulder extension     Shoulder abduction 5 5  Shoulder adduction    Shoulder extension    Shoulder internal rotation 5 5  Shoulder external rotation 4 4  Middle trapezius    Lower trapezius    Elbow flexion    Elbow extension    Wrist flexion    Wrist extension    Wrist ulnar deviation    Wrist radial deviation    Wrist pronation    Wrist supination    Grip strength     (Blank rows = not tested)                                                                                                                           TREATMENT DATE:  08/01/24 Seated thoracic extension 5 sec X 10 Upper trap stretch 10 sec X 5 bilat Standing thoracic extensions with strap 5 sec X 10 Standing lumabar L stretch 10 sec X 10 Standing shoulder extensions red band X 10 reps bilat Seated cervical retractions X 10, hold 5 sec Neck isometrics 5 sec X 10 each for lateral sidebending bilat, flexion, extension Seated neck rotation stretch AAROM with towel 5 sec X 10 bilat Bilat shoulder ER squeeze robber 5 sec X10 Mechanical cervical traction 21-16# intermittent X 15 min  08/01/24 Standing thoracic extensions with strap 5 sec X 10 Standing lumabar L stretch 10 sec X 10 Standing shoulder extensions red band X 10 reps bilat Seated cervical retractions X 10, hold 5 sec Neck isometrics 5 sec X 10 each for lateral sidebending bilat, flexion, extension Seated neck rotation stretch AAROM with towel 5 sec X 10 bilat Bilat shoulder ER squeeze robber 5 sec X10 Mechanical cervical traction 21-16# intermittent X 15 min   PATIENT EDUCATION: Education details: HEP, PT plan of care,  Person educated: Patient Education method: Explanation, Demonstration, Verbal cues, and Handouts Education comprehension: verbalized understanding, further education recommended   HOME EXERCISE PROGRAM: Access Code: [email or web address] URL: [email or web address] Date: 08/01/2024 Prepared by: Redell Moose  Exercises - Seated Thoracic Self  Mobilization  - 5 x daily - 7 x weekly - 1 sets - 10 reps - 5 sec hold - Seated Cervical Retraction  - 5 x daily - 6 x weekly - 2 sets - 10 reps - Seated Cervical Sidebending Stretch (Mirrored)  - 1 x daily - 6 x weekly - 1 sets - 3 reps - 30 sec hold - Seated Assisted Cervical Rotation with Towel  - 1 x daily - 6 x weekly - 1 sets - 10 reps - 5 sec hold - Standing Isometric Cervical Flexion with Manual Resistance  - 1 x daily - 6 x weekly - 3 sets - 10 reps - Standing Isometric Cervical Sidebending with Manual Resistance  - 1 x daily - 6 x weekly - 1 sets - 10 reps - 6 hold - Standing Isometric Cervical Extension with Manual Resistance  - 1 x daily - 6 x weekly - 1 sets - 10 reps - 5 hold - Shoulder External Rotation  with Anchored Resistance  - 1 x daily - 3 x weekly - 2 sets - 15 reps - Standing 'L' Stretch at Counter  - 2 x daily - 6 x weekly - 1 sets - 10 reps - 10 sec hold - Standing Lumbar Extension  - 2 x daily - 6 x weekly - 1 sets - 10 reps - 5 sec hold - Shoulder External Rotation and Scapular Retraction  - 2 x daily - 6 x weekly - 1 sets - 10 reps - 5 hold - Standing Shoulder Extension with Resistance  - 2 x daily - 6 x weekly - 1 sets - 10 reps   ASSESSMENT:  CLINICAL IMPRESSION:  I answered her question about one of the exercises and now she has a better understanding of it. PT recommending to continue with current PT plan.  Per eval: Patient referred to PT for cervical radiculopathy. She may also have some left RTC injury as she is weak with external rotation in this arm and gets pain reaching overhead. Patient will benefit from skilled PT to improve overall function and to address impairments and limitations listed below.  OBJECTIVE IMPAIRMENTS: decreased activity tolerance for ADL's, decreased ROM, decreased strength, impaired flexibility, impaired UE use, and pain.  ACTIVITY LIMITATIONS: exercise classes, cleaning, community activity, driving, reaching, carry PERSONAL  FACTORS: see above PMH  also affecting patient's functional outcome.  REHAB POTENTIAL: Good  CLINICAL DECISION MAKING: Evolving/moderate complexity  EVALUATION COMPLEXITY: Moderate    GOALS: Short term PT Goals Target date: 07/28/2024   Pt will be I and compliant with HEP. Baseline:  Goal status: MET 08/01/24 Pt will decrease pain by 25% overall Baseline: Goal status: MET 08/01/24  Long term PT goals Target date:08/11/2024   Pt will improve neck ROM to [facility] to improve functional mobility Baseline: Goal status: ongoing 08/01/24 Pt will improve left shoulder strength to at least 4+/5 MMT to improve functional strength Baseline: Goal status: ongoing 08/01/24 Pt will improve Patient specific functional scale (PSFS) to at least 7/10 to show improved function level Baseline: Goal status: ongoing 08/01/24 Pt will reduce pain to overall less than 3/10 with usual activity and work activity. Baseline: Goal status: ongoing 08/01/24  PLAN: PT FREQUENCY: 1-2 times per week   PT DURATION: 4-6 weeks  PLANNED INTERVENTIONS  97110-Therapeutic exercises, 97530- Therapeutic activity, 97112- Neuromuscular re-education, 97535- Self Care, [id number]- Manual therapy, [id number]- Electrical stimulation (unattended), 97035- Ultrasound, 02987- Traction (mechanical), and 97033- Ionotophoresis 4mg /ml Dexamethasone   PLAN FOR NEXT SESSION: traction and mckenezie program. How are new thoracic exercises going  NEXT MD VISIT:   Redell JONELLE Moose, PT,DPT 08/05/2024, 9:53 AM

## 2024-08-06 ENCOUNTER — Other Ambulatory Visit: Payer: Self-pay

## 2024-08-06 ENCOUNTER — Other Ambulatory Visit (HOSPITAL_BASED_OUTPATIENT_CLINIC_OR_DEPARTMENT_OTHER): Payer: Self-pay

## 2024-08-11 ENCOUNTER — Encounter: Payer: Self-pay | Admitting: Adult Health

## 2024-08-11 ENCOUNTER — Ambulatory Visit: Admitting: Adult Health

## 2024-08-11 VITALS — BP 137/84 | HR 83 | Temp 99.3°F | Ht 63.0 in | Wt 208.0 lb

## 2024-08-11 DIAGNOSIS — G4733 Obstructive sleep apnea (adult) (pediatric): Secondary | ICD-10-CM

## 2024-08-11 DIAGNOSIS — R918 Other nonspecific abnormal finding of lung field: Secondary | ICD-10-CM

## 2024-08-11 DIAGNOSIS — J4520 Mild intermittent asthma, uncomplicated: Secondary | ICD-10-CM | POA: Diagnosis not present

## 2024-08-11 DIAGNOSIS — Z6836 Body mass index (BMI) 36.0-36.9, adult: Secondary | ICD-10-CM | POA: Diagnosis not present

## 2024-08-11 DIAGNOSIS — M5412 Radiculopathy, cervical region: Secondary | ICD-10-CM | POA: Diagnosis not present

## 2024-08-11 NOTE — Patient Instructions (Addendum)
 Continue on CPAP At bedtime, wear all night long for at least 6hr or more.  Discuss with DME regarding new headgear options.  Use Saline nasal gel At bedtime   Work on healthy weight loss  Do not drive if sleepy  Use caution with sedating medications  Discuss with PCP regarding if you are candidate for Zepbound (weight loss with Sleep apnea)  Call back if you want to move forward with INSPIRE workup.   Continue on Singulair  10mg  At bedtime .  Zyrtec  10mg  At bedtime  as needed  drainage/allergies  Albuterol  inhaler As needed    Follow up in 3 month with Dr. Neda (30 min slot)  and As needed

## 2024-08-11 NOTE — Progress Notes (Signed)
 @Patient  ID: Marie Farmer, female    DOB: September 28, 1955, 68 y.o.   MRN: [id number]  Chief Complaint  Patient presents with   Obstructive Sleep Apnea    F/u    Referring provider: Rosamond Leta NOVAK, MD  HPI: 68 yo female minimal smoking history seen for pulmonary consult January 2025 for daytime sleepiness and pulmonary hypertension found to have moderate OSA and Mild intermittent asthma and allergic rhinitis  Medical history significant for RA, Chronic pain-neck/back, thyroid  nodules     TEST/EVENTS : Reviewed 08/11/2024  In-lab sleep study March 05, 2024 Moderate OSA (AHI 17./hr, SpO2 low 81%)-optimal control on 14cmH2o.     pulmonary function testing that was completed on November 22, 2023 that showed normal lung function with no airflow obstruction or restriction and normal diffusing capacity.    2D echo showed EF at 58%, grade 1 diastolic dysfunction, normal right ventricular size, mild MR, mild to moderate TR and moderate pulmonary hypertension with RVSP at 41 mmHg.   03/15/24 HRCT chest -scattered micro nodules suggestive of prior granulomatous disease Neg for ILD, Stable left adrenal adenoma, left kidney cysts, multinodular thyroid    Thyroid  US  03/11/24-thyroid  nodules stable    Discussed the use of AI scribe software for clinical note transcription with the patient, who gave verbal consent to proceed.  History of Present Illness Marie Farmer is a 68 year old female with moderate obstructive sleep apnea who presents with issues related to CPAP use.  Patient was referred by cardiology after echo showed moderate pulmonary hypertension.  She had risk factors for sleep apnea and was set up for a sleep study that showed moderate obstructive sleep apnea. Sleep study in June showing AHI 17.6/hr and SpO2 low at 81%.  She was started on CPAP therapy.  Last visit was doing well on CPAP.  Initially, she achieved up to seven hours of CPAP use per night and felt well. However, she reports a  decline in CPAP use due to discomfort from the mask strap exacerbating chronic neck pain.  She has tried different masks, including a nasal mask, but returned to using a full face mask, which she finds uncomfortable.  Wants to discuss alternative treatments for sleep apnea.  We discussed Zepbound and inspire device in detail.  Patient's family member is in attendance for today's visit.  CPAP download shows excellent compliance with daily average usage at 4.5 hours she is on CPAP 14 cm H2O.  AHI 1.0/hour  Patient has a history of asthma was having some increased shortness of breath at her initial visit.  Pulmonary function testing showed normal lung function with no airflow obstruction or restriction.  Normal diffusing capacity.  She has a very minimum smoking history.  Does have secondhand smoke exposure.  Also has some chronic rhinitis. No current use of albuterol  and no recent episodes of shortness of breath. She continues to take montelukast  (Singulair ) for allergic rhinitis.  Her past medical history includes rheumatoid arthritis, managed with sulfasalazine . She was previously followed by a rheumatologist but is now managed by her primary care  Chest x-ray showed coarsened interstitial markings. She has a minimal smoking history, having smoked marijuana in her early twenties and been exposed to secondhand smoke from family members. High-resolution CT chest in July was negative for interstitial lung disease.  Showed few scattered micronodules suggestive of prior granulomatous disease.    Family history includes a sister with thyroid  cancer unknown type. . She has had a thyroid  nodule biopsy in the  past, does not have a personal history of thyroid  cancer. She is followed by an endocrinologist.      Allergies  Allergen Reactions   Ace Inhibitors Shortness Of Breath   Beta Adrenergic Blockers Shortness Of Breath   Adhesive [Tape]     Peels skin   Cymbalta [Duloxetine Hcl]     Gums red, mouth  felt hout   Methylprednisolone      Redness all over   Prednisone Other (See Comments)    Redness all over   Azithromycin Rash   Latex Rash    Sensitive skin, causes redness    Nabumetone Rash and Other (See Comments)    lips swollen      Immunization History  Administered Date(s) Administered   Fluad Trivalent(High Dose 65+) 07/20/2023   Influenza Inj Mdck Quad With Preservative 05/31/2020   Influenza-Unspecified 10/23/2016   PFIZER Comirnaty(Gray Top)Covid-19 Tri-Sucrose Vaccine 10/01/2020   PFIZER(Purple Top)SARS-COV-2 Vaccination 01/05/2020, 01/26/2020   Tdap 01/13/2013    Past Medical History:  Diagnosis Date   Anxiety    Asthma    DDD (degenerative disc disease)    chronic back pain   Fibromyalgia    Foot pain    GERD (gastroesophageal reflux disease)    Hypertension    IBS (irritable bowel syndrome)    Mood disorder    PONV (postoperative nausea and vomiting)    Sleep apnea    Venous insufficiency    Wears glasses     Tobacco History: Social History   Tobacco Use  Smoking Status Former   Current packs/day: 0.00   Types: Cigarettes   Quit date: 12/11/1983   Years since quitting: 40.6  Smokeless Tobacco Never   Counseling given: Not Answered   Outpatient Medications Prior to Visit  Medication Sig Dispense Refill   Biotin 1000 MCG CHEW Chew 1 tablet by mouth daily.     cetirizine  (ZYRTEC ) 10 MG tablet Take 1 tablet (10 mg total) by mouth at bedtime. 90 tablet 4   famotidine  (PEPCID ) 20 MG tablet Take 1 tablet (20 mg total) by mouth daily. 90 tablet 3   gabapentin  (NEURONTIN ) 600 MG tablet Take 1 tablet (600 mg total) by mouth 3 (three) times daily. 270 tablet 3   levothyroxine  (SYNTHROID ) 75 MCG tablet Take 1 tablet (75 mcg total) by mouth in the morning on an empty stomach. 90 tablet 11   Magnesium 400 MG CAPS Take 2 tablets by mouth daily.     montelukast  (SINGULAIR ) 10 MG tablet Take 1 tablet (10 mg total) by mouth daily. 90 tablet 3   OVER THE  COUNTER MEDICATION Take 1 tablet by mouth daily.     sulfaSALAzine  (AZULFIDINE ) 500 MG tablet Take 3 tablets (1,500 mg total) by mouth 2 (two) times daily. 540 tablet 4   tiZANidine  (ZANAFLEX ) 4 MG tablet Take 1 tablet (4 mg total) by mouth at bedtime. 90 tablet 3   valACYclovir  (VALTREX ) 1000 MG tablet Take 1 tablet (1,000 mg total) by mouth every 12 (twelve) hours as needed. 6 tablet 2   valsartan -hydrochlorothiazide  (DIOVAN -HCT) 320-25 MG tablet Take 1 tablet by mouth daily. 90 tablet 4   Vitamin D , Ergocalciferol , (DRISDOL ) 1.25 MG (50000 UNIT) CAPS capsule Take 1 capsule (50,000 Units total) by mouth once a week. 12 capsule 4   famotidine  (PEPCID ) 20 MG tablet Take 1 tablet (20 mg total) by mouth daily. (Patient not taking: Reported on 08/11/2024) 90 tablet 3   fluticasone  (FLONASE ) 50 MCG/ACT nasal spray Place 2 sprays into  both nostrils daily. (Patient not taking: Reported on 08/11/2024) 16 g 3   gabapentin  (NEURONTIN ) 600 MG tablet Take 1 tablet (600 mg total) by mouth 3 (three) times daily. (Patient not taking: Reported on 08/11/2024) 360 tablet 2   omeprazole  (PRILOSEC) 20 MG capsule Take 1 capsule (20 mg total) by mouth 2 (two) times daily. (Patient not taking: Reported on 08/11/2024) 180 capsule 4   valACYclovir  (VALTREX ) 500 MG tablet Take 1 tablet (500 mg total) by mouth every 12 (twelve) hours as needed. (Patient not taking: Reported on 08/11/2024) 6 tablet 0   valsartan -hydrochlorothiazide  (DIOVAN -HCT) 320-25 MG tablet Take 1 tablet by mouth daily. (Patient not taking: Reported on 08/11/2024)     No facility-administered medications prior to visit.     Review of Systems:   Constitutional:   No  weight loss, night sweats,  Fevers, chills,+ fatigue, or  lassitude.  HEENT:   No headaches,  Difficulty swallowing,  Tooth/dental problems, or  Sore throat,                No sneezing, itching, ear ache, +nasal congestion, post nasal drip,   CV:  No chest pain,  Orthopnea, PND, swelling in  lower extremities, anasarca, dizziness, palpitations, syncope.   GI  No heartburn, indigestion, abdominal pain, nausea, vomiting, diarrhea, change in bowel habits, loss of appetite, bloody stools.   Resp: No shortness of breath with exertion or at rest.  No excess mucus, no productive cough,  No non-productive cough,  No coughing up of blood.  No change in color of mucus.  No wheezing.  No chest wall deformity  Skin: no rash or lesions.  GU: no dysuria, change in color of urine, no urgency or frequency.  No flank pain, no hematuria   MS:  No joint pain or swelling.  No decreased range of motion.  No back pain.    Physical Exam  BP 137/84   Pulse 83   Temp 99.3 F (37.4 C)   Ht 5' 3 (1.6 m) Comment: Per pt  Wt 208 lb (94.3 kg)   LMP 05/12/2007   SpO2 94% Comment: RA  BMI 36.85 kg/m   GEN: A/Ox3; pleasant , NAD, well nourished    HEENT:  [place]/AT,   NOSE-clear, THROAT-clear, no lesions, no postnasal drip or exudate noted.   NECK:  Supple w/ fair ROM; no JVD; normal carotid impulses w/o bruits; no thyromegaly or nodules palpated; no lymphadenopathy.    RESP  Clear  P & A; w/o, wheezes/ rales/ or rhonchi. no accessory muscle use, no dullness to percussion  CARD:  RRR, no m/r/g, no peripheral edema, pulses intact, no cyanosis or clubbing.  GI:   Soft & nt; nml bowel sounds; no organomegaly or masses detected.   Musco: Warm bil, no deformities or joint swelling noted.   Neuro: alert, no focal deficits noted.    Skin: Warm, no lesions or rashes    Lab Results:Reviewed 08/11/2024   CBC  Imaging: No results found.  Administration History     None          Latest Ref Rng & Units 11/22/2023   12:29 PM  PFT Results  FVC-Pre L 2.95   FVC-Predicted Pre % 96   FVC-Post L 2.95   FVC-Predicted Post % 97   Pre FEV1/FVC % % 88   Post FEV1/FCV % % 88   FEV1-Pre L 2.58   FEV1-Predicted Pre % 111   FEV1-Post L 2.61   DLCO uncorrected ml/min/mmHg  17.38   DLCO UNC% % 90    DLVA Predicted % 93     No results found for: NITRICOXIDE      No data to display              Assessment & Plan:   Assessment and Plan Assessment & Plan Obstructive sleep apnea  -moderate obstructive sleep apnea with CPAP intolerance Intolerant to CPAP therapy due to neck pain, Discussed the Inspire device as an alternative, requiring drug induced sleep endoscopy and ENT consult.  GLP-1 therapy (Zepbound) was discussed for weight loss, with potential benefits for sleep apnea.  Continue CPAP therapy for now. Work with DME on theatre stage manager alternative.  Discuss GLP-1 therapy with primary care provider as patient has history of IBS.  Also has a family history of thyroid  cancer will need to make sure not medullary . Consider Inspire device if if unable to tolerate CPAP. Provided information on Inspire device and GLP-1 therapy. Scheduled follow-up in 3 months.  Mild intermittent asthma and allergic rhinitis  -appears well-controlled. Well-controlled with montelukast . No recent use of albuterol . Continue montelukast  therapy.  Nonspecific pulmonary nodules on CT chest  -micronodules noted on high-resolution CT chest July 2025.  CT scan negative for ILD. Nonspecific pulmonary nodules on CT chest,  No significant smoking history. Ordered repeat CT chest in July 2026.  History of rheumatoid arthritis-reported as stable.  Continue on current regimen with primary care.  Negative for ILD on HRCT chest  Morbid obesity with BMI at 36.  Unsuccessful in weight loss journey.  Continue on healthy weight loss with diet and exercise.-Discussed with primary care regarding Zepbound candidacy..  Chronic neck pain.-Continue follow-up with orthopedics.  Ongoing difficulties with chronic neck pain.  Difficulty tolerating CPAP due to headgear.  Plan  Patient Instructions  Continue on CPAP At bedtime, wear all night long for at least 6hr or more.  Discuss with DME regarding new headgear options.  Use  Saline nasal gel At bedtime   Work on healthy weight loss  Do not drive if sleepy  Use caution with sedating medications  Discuss with PCP regarding if you are candidate for Zepbound (weight loss with Sleep apnea)  Call back if you want to move forward with INSPIRE workup.   Continue on Singulair  10mg  At bedtime .  Zyrtec  10mg  At bedtime  as needed  drainage/allergies  Albuterol  inhaler As needed    Follow up in 3 month with Dr. Neda (30 min slot)  and As needed                Madelin Stank, NP 08/11/2024  I spent  34  minutes dedicated to the care of this patient on the date of this encounter to include pre-visit review of records, face-to-face time with the patient discussing conditions above, post visit ordering of testing, clinical documentation with the electronic health record, making appropriate referrals as documented, and communicating necessary findings to members of the patients care team.

## 2024-08-12 ENCOUNTER — Ambulatory Visit: Admitting: Physical Therapy

## 2024-08-15 ENCOUNTER — Ambulatory Visit: Admitting: Physical Therapy

## 2024-08-15 ENCOUNTER — Telehealth: Payer: Self-pay | Admitting: *Deleted

## 2024-08-15 NOTE — Telephone Encounter (Unsigned)
 Copied from CRM [id number]. Topic: Clinical - Medication Question >> Aug 13, 2024  4:54 PM Lavanda D wrote: Reason for CRM: Patient calling back to advise Tammy on the following: She was considering putting her on a weight loss medication, however, she needed to know what type of cancer her sister has she in order for consideration for family history. Her sister has patillary thyroid  carcinoma.She has decided she would like to go on this medication if this cancer type of medication. Please call to advise.   Pharmacy pref: [facility] [address] [place] [address] Phone: [phone] Fax: [phone]

## 2024-08-18 ENCOUNTER — Other Ambulatory Visit (HOSPITAL_BASED_OUTPATIENT_CLINIC_OR_DEPARTMENT_OTHER): Payer: Self-pay

## 2024-08-18 MED ORDER — ZEPBOUND 2.5 MG/0.5ML ~~LOC~~ SOAJ
2.5000 mg | SUBCUTANEOUS | 0 refills | Status: AC
  Filled 2024-08-18: qty 2, 28d supply, fill #0

## 2024-08-18 NOTE — Telephone Encounter (Signed)
 Patient has moderate sleep apnea related to obesity with BMI >/30, posing significant cardiovascular risks. Patient has failed traditional weight loss measures with caloric deficit and consistent exercise of 150 min/week for >/6 months. Patient will be initiated on Zepbound  (tirzepatide ) for weight management. Zepbound  is the only pharmaceutical treatment approved for moderate-to-severe OSA in adults who are overweight (BMI >/27) or obese (BMI >/30). The patient will continue lifestyle modifications, including structured nutrition and physical activity as directed. No other GLP1 therapy will be used simultaneously at this time. The patient does not have any FDA labeled contraindications to this agent, including pregnancy, lactation, hx or family history of medullary thyroid  cancer, or multiple endocrine neoplasia type II. Side effect profile has been reviewed with patient. Aware of red flag symptoms to notify of immediately or seek emergency care, including severe nausea/vomiting, inability to pass bowels or gas, severe abdominal pain/tenderness, jaundice.    She requests rx be sent to her local pharmacy  She is to call and make ov in 4-6 weeks for follow up   Zepbound  2.5mg  SQ weekly .   Patient aware

## 2024-08-19 ENCOUNTER — Other Ambulatory Visit: Payer: Self-pay

## 2024-08-19 ENCOUNTER — Other Ambulatory Visit (HOSPITAL_BASED_OUTPATIENT_CLINIC_OR_DEPARTMENT_OTHER): Payer: Self-pay

## 2024-08-19 ENCOUNTER — Telehealth (HOSPITAL_BASED_OUTPATIENT_CLINIC_OR_DEPARTMENT_OTHER): Payer: Self-pay

## 2024-08-19 NOTE — Telephone Encounter (Signed)
 Medication: zepbound  2.5mg  Able to fill? No Prior authorization required? Yes Co-pay before assistance: N/A

## 2024-08-20 ENCOUNTER — Telehealth: Payer: Self-pay

## 2024-08-20 ENCOUNTER — Other Ambulatory Visit (HOSPITAL_COMMUNITY): Payer: Self-pay

## 2024-08-20 ENCOUNTER — Other Ambulatory Visit (HOSPITAL_BASED_OUTPATIENT_CLINIC_OR_DEPARTMENT_OTHER): Payer: Self-pay

## 2024-08-20 NOTE — Telephone Encounter (Signed)
 A user error has taken place: encounter opened in error, closed for administrative reasons.

## 2024-08-20 NOTE — Telephone Encounter (Signed)
*  Pulm  Pharmacy Patient Advocate Encounter   Received notification from RX Request Messages that prior authorization for Zepbound  2.5MG /0.5ML pen-injectors   is required/requested.   Insurance verification completed.   The patient is insured through [facility] ADV[employer]/RX ADVANCE.   Per test claim: PA required; PA submitted to above mentioned insurance via Latent Key/confirmation #/EOC [id number] Status is pending

## 2024-08-20 NOTE — Telephone Encounter (Signed)
 PA request has been Received. New Encounter has been or will be created for follow up. For additional info see Pharmacy Prior Auth telephone encounter from 12/10.

## 2024-08-21 ENCOUNTER — Ambulatory Visit: Admitting: Physical Therapy

## 2024-08-22 NOTE — Telephone Encounter (Signed)
 Denial received, pending denial letter receipt

## 2024-08-25 NOTE — Telephone Encounter (Signed)
 Pharmacy Patient Advocate Encounter  Received notification from [employer] that Prior Authorization for Zepbound  has been DENIED.  Full denial letter will be uploaded to the media tab. See denial reason below.  Formulary alternatives not tried are modafinil and armodafinil.

## 2024-08-26 ENCOUNTER — Telehealth: Payer: Self-pay | Admitting: Pharmacist

## 2024-08-26 ENCOUNTER — Ambulatory Visit: Admitting: Physical Therapy

## 2024-08-26 NOTE — Telephone Encounter (Signed)
 Appeal has been submitted. Will advise when response is received, please be advised that most companies may take 30 days to make a decision. Appeal letter and supporting documentation have been uploaded and submitted via CMM.  Thank you, Devere Pandy, PharmD Clinical Pharmacist  [place]  Direct Dial: [phone]

## 2024-08-27 ENCOUNTER — Other Ambulatory Visit: Payer: Self-pay

## 2024-09-01 ENCOUNTER — Other Ambulatory Visit (HOSPITAL_COMMUNITY): Payer: Self-pay

## 2024-09-01 NOTE — Telephone Encounter (Signed)
 Appeal for Zepbound  has been denied by the insurance, full denial letter can be found under the media tab.

## 2024-09-09 ENCOUNTER — Other Ambulatory Visit (HOSPITAL_BASED_OUTPATIENT_CLINIC_OR_DEPARTMENT_OTHER): Payer: Self-pay

## 2024-09-09 ENCOUNTER — Other Ambulatory Visit (HOSPITAL_COMMUNITY): Payer: Self-pay

## 2024-11-11 ENCOUNTER — Encounter: Admitting: Psychology

## 2024-11-20 ENCOUNTER — Ambulatory Visit: Admitting: Pulmonary Disease
